# Patient Record
Sex: Male | Born: 1990
Health system: Southern US, Community
[De-identification: ages and names within clinical notes are randomized; demographics above are authoritative.]

## PROBLEM LIST (undated history)

## (undated) DIAGNOSIS — J40 Bronchitis, not specified as acute or chronic: Secondary | ICD-10-CM

## (undated) DIAGNOSIS — I509 Heart failure, unspecified: Secondary | ICD-10-CM

## (undated) DIAGNOSIS — F419 Anxiety disorder, unspecified: Secondary | ICD-10-CM

## (undated) DIAGNOSIS — Z21 Asymptomatic human immunodeficiency virus [HIV] infection status: Secondary | ICD-10-CM

## (undated) DIAGNOSIS — G4733 Obstructive sleep apnea (adult) (pediatric): Secondary | ICD-10-CM

## (undated) DIAGNOSIS — B20 Human immunodeficiency virus [HIV] disease: Secondary | ICD-10-CM

## (undated) DIAGNOSIS — J45909 Unspecified asthma, uncomplicated: Secondary | ICD-10-CM

## (undated) HISTORY — DX: Asymptomatic human immunodeficiency virus (hiv) infection status: Z21

## (undated) HISTORY — DX: Human immunodeficiency virus (HIV) disease: B20

## (undated) HISTORY — DX: Anxiety disorder, unspecified: F41.9

## (undated) NOTE — *Deleted (*Deleted)
PCP: Massie Maroon, FNP Cardiology: Dr. Shirlee Latch  71 y.o. with history of asthma, OSA, HIV+, and nonischemic cardiomyopathy returns for followup of CHF.  He was admitted in 6/20 with dyspnea and found to have CHF.  Echo showed EF 15-20% with diffuse hypokinesis, moderate-severe MR. LHC/RHC showed mild coronary disease, preserved cardiac output.  He was also diagnosed with HIV and started on Biktarvy. Also w/ OSA on CPAP and heavy ETOH use (drinks ~1 pint liquor/week).   Echo in 10/20 showed EF up to 35-40%, MR is only trivial.  Echo in 2/21 showed EF 35-40% with normal RV. No cMRI due to size.   He was seen in the ED 6/21 for cough, nasal congestion and pleuritic CP. Had + COVID test w/ CXR c/w PNA but did not require hospitalization. He quarantined at home and treated w/ outpatient abx. He has recovered w/o any residual symptoms.   He returns to clinic for 3 month f/u. Last seen by Dr. Shirlee Latch 5/21 and was stable at that time w/ NYHA II symptoms and euvolemic.  Today he returns for HF follow up. Last visit farxiga was started. Overall feeling fine. Denies SOB/PND/Orthopnea. Appetite ok. No fever or chills. Weight at home  pounds. Taking all medications  Labs (9/20): K 4.2, creatinine 1.48 Labs (10/20): LDL 73, HDL 26 Labs (11/20): K 4.1, creatinine 1.39 Labs (12/20): K 4.2, creatinine 1.43 Labs (5/21):  K 4.2, creatinine 1.36    PMH: 1. Chronic systolic CHF: Nonischemic cardiomyopathy.   - LHC/RHC (6/20): mean RA 4, mean PCWP 21, CI 3; 70% D1 stenosis.  - Echo (6/20): EF 15-20%, normal RV, moderate-severe MR.  - Echo (10/20): EF 35-40% with diffuse hypokinesis, normal RV, trivial MR.   - Echo (2/21): EF 35-40%, diffuse hypokinesis, normal RV, normal IVC 2. HIV+: Followed by ID.  3. H/o ETOH abuse.  4. Asthma: Since childhood.  5. HTN 6. Morbid obesity.  7. OSA: Uses CPAP.  8. CAD: LHC in 6/20 with 70% D1 stenosis.   FH: Father, grandf ather with CAD, no cardiomyopathy.   SH:  Lives in [place] with girlfriend and child, works for [employer] [place].  Nonsmoker.  Prior heavy ETOH, now drinks some on weekend but much less. Nonsmoker, no drugs.   ROS: All systems reviewed and negative except as per HPI.   Current Outpatient Medications  Medication Sig Dispense Refill  . albuterol (VENTOLIN HFA) 108 (90 Base) MCG/ACT inhaler Inhale 1-2 puffs into the lungs every 6 (six) hours as needed for wheezing or shortness of breath. 6.7 g 2  . aspirin 81 MG chewable tablet Chew 1 tablet (81 mg total) by mouth daily. 30 tablet 3  . atorvastatin (LIPITOR) 20 MG tablet TAKE 1 TABLET BY MOUTH EVERY DAY 90 tablet 3  . BIKTARVY 50-200-25 MG TABS tablet TAKE 1 TABLET BY MOUTH EVERY DAY 30 tablet 4  . bisoprolol (ZEBETA) 10 MG tablet Take 1 tablet (10 mg total) by mouth daily. 30 tablet 11  . dapagliflozin propanediol (FARXIGA) 10 MG TABS tablet Take 1 tablet (10 mg total) by mouth daily before breakfast. 30 tablet 3  . fluticasone (FLONASE) 50 MCG/ACT nasal spray Place 2 sprays into both nostrils daily. 16 g 0  . furosemide (LASIX) 80 MG tablet Take 1 tablet (80 mg total) by mouth daily. Take one TAB every morning. 30 tablet 3  . ID NOW COVID-19 KIT See admin instructions.    . isosorbide-hydrALAZINE (BIDIL) 20-37.5 MG tablet Take 2 tablets by  mouth 3 (three) times daily. 180 tablet 5  . sacubitril-valsartan (ENTRESTO) 97-103 MG Take 1 tablet by mouth 2 (two) times daily. 180 tablet 3  . spironolactone (ALDACTONE) 25 MG tablet TAKE 1 TABLET BY MOUTH EVERY DAY 90 tablet 1   No current facility-administered medications for this visit.   There were no vitals taken for this visit. PHYSICAL EXAM:  General:  Well appearing. No resp difficulty HEENT: normal Neck: supple. no JVD. Carotids 2+ bilat; no bruits. No lymphadenopathy or thryomegaly appreciated. Cor: PMI nondisplaced. Regular rate & rhythm. No rubs, gallops or murmurs. Lungs: clear Abdomen: soft, nontender, nondistended. No  hepatosplenomegaly. No bruits or masses. Good bowel sounds. Extremities: no cyanosis, clubbing, rash, edema Neuro: alert & orientedx3, cranial nerves grossly intact. moves all 4 extremities w/o difficulty. Affect pleasant    Assessment/Plan: 1. Chronic systolic CHF: Nonischemic cardiomyopathy.  Echo in 6/20 showed EF 15-20% with moderate-severe MR.  RHC/LHC indicated nonischemic cardiomyopathy, cardiac output preserved. Cause of cardiomyopathy is uncertain, he was a heavy drinker in the past.  He has also been found to have HIV, which can cause a cardiomyopathy.  Echo in 10/20 showed EF improved up to 35-40%.  Echo in 2/21 showed EF 35-40% still.  Marland Kitchen  NYHA  - Continue bisoprolol 10 mg daily.  - Continue Entresto 97/103 bid.  - Continue spironolactone 25 mg daily.  - Continue Bidil  2 tabs tid.   - Continue current Lasix, 80 mg daily.   - Continue Fargixa 10 mg daily  -Out of range for ICD - unable to complete cardiac MRI due to size  2. CAD: LHC (6/20) showed 70% D1 stenosis.  - denies CP   - Continue ASA 81 daily.  - Continue atorvastatin 20 mg daily.  3. Mitral regurgitation: Much improved on most recent echoes.  4. ETOH abuse: Prior ETOH abuse may have contributed to his cardiomyopathy. Still drinking about 1/2 pint liquor/week, has reduced intake over the last 3 months, per pt report 5. OSA: on CPAP, reports nightly compliance  6. Obesity: He is interested in bariatric surgery.  - trying to lose wt     Tonye Becket, PA-C  01/17/2020

---

## 2017-08-19 ENCOUNTER — Emergency Department (HOSPITAL_COMMUNITY)
Admission: EM | Admit: 2017-08-19 | Discharge: 2017-08-20 | Disposition: A | Discharge status: Home / Self Care | Payer: Self-pay | Attending: Emergency Medicine | Admitting: Emergency Medicine

## 2017-08-19 ENCOUNTER — Encounter (HOSPITAL_COMMUNITY): Payer: Self-pay | Admitting: Emergency Medicine

## 2017-08-19 DIAGNOSIS — I10 Essential (primary) hypertension: Secondary | ICD-10-CM | POA: Insufficient documentation

## 2017-08-19 DIAGNOSIS — J4521 Mild intermittent asthma with (acute) exacerbation: Secondary | ICD-10-CM

## 2017-08-19 DIAGNOSIS — E119 Type 2 diabetes mellitus without complications: Secondary | ICD-10-CM | POA: Insufficient documentation

## 2017-08-19 DIAGNOSIS — M5412 Radiculopathy, cervical region: Secondary | ICD-10-CM | POA: Insufficient documentation

## 2017-08-19 HISTORY — DX: Unspecified asthma, uncomplicated: J45.909

## 2017-08-19 MED ORDER — ALBUTEROL SULFATE (2.5 MG/3ML) 0.083% IN NEBU
5.0000 mg | INHALATION_SOLUTION | Freq: Once | RESPIRATORY_TRACT | Status: AC
  Administered 2017-08-19: 5 mg via RESPIRATORY_TRACT
  Filled 2017-08-19: qty 6

## 2017-08-19 NOTE — ED Triage Notes (Signed)
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

## 2017-08-20 ENCOUNTER — Emergency Department (HOSPITAL_COMMUNITY): Payer: Self-pay

## 2017-08-20 LAB — CBC
HCT: 38.8 % — ABNORMAL LOW (ref 39.0–52.0)
Hemoglobin: 13.4 g/dL (ref 13.0–17.0)
MCH: 31.1 pg (ref 26.0–34.0)
MCHC: 34.5 g/dL (ref 30.0–36.0)
MCV: 90 fL (ref 78.0–100.0)
PLATELETS: 236 10*3/uL (ref 150–400)
RBC: 4.31 MIL/uL (ref 4.22–5.81)
RDW: 12.7 % (ref 11.5–15.5)
WBC: 10.1 10*3/uL (ref 4.0–10.5)

## 2017-08-20 LAB — BASIC METABOLIC PANEL
Anion gap: 10 (ref 5–15)
BUN: 12 mg/dL (ref 6–20)
CO2: 25 mmol/L (ref 22–32)
CREATININE: 1.14 mg/dL (ref 0.61–1.24)
Calcium: 8.9 mg/dL (ref 8.9–10.3)
Chloride: 104 mmol/L (ref 101–111)
GFR calc non Af Amer: >60 mL/min (ref >60)
Glucose, Bld: 111 mg/dL — ABNORMAL HIGH (ref 65–99)
Potassium: 3.6 mmol/L (ref 3.5–5.1)
Sodium: 139 mmol/L (ref 135–145)

## 2017-08-20 LAB — I-STAT TROPONIN, ED: TROPONIN I, POC: 0.01 ng/mL (ref 0.00–0.08)

## 2017-08-20 MED ORDER — ALBUTEROL SULFATE (2.5 MG/3ML) 0.083% IN NEBU
5.0000 mg | INHALATION_SOLUTION | Freq: Once | RESPIRATORY_TRACT | Status: AC
  Administered 2017-08-20: 5 mg via RESPIRATORY_TRACT
  Filled 2017-08-20: qty 6

## 2017-08-20 MED ORDER — PREDNISONE 20 MG PO TABS
60.0000 mg | ORAL_TABLET | Freq: Once | ORAL | Status: AC
  Administered 2017-08-20: 60 mg via ORAL
  Filled 2017-08-20: qty 3

## 2017-08-20 MED ORDER — ALBUTEROL SULFATE HFA 108 (90 BASE) MCG/ACT IN AERS
2.0000 | INHALATION_SPRAY | RESPIRATORY_TRACT | Status: DC | PRN
  Administered 2017-08-20: 2 via RESPIRATORY_TRACT
  Filled 2017-08-20: qty 6.7

## 2017-08-20 MED ORDER — IPRATROPIUM BROMIDE 0.02 % IN SOLN
0.5000 mg | Freq: Once | RESPIRATORY_TRACT | Status: AC
  Administered 2017-08-20: 0.5 mg via RESPIRATORY_TRACT
  Filled 2017-08-20: qty 2.5

## 2017-08-20 MED ORDER — PREDNISONE 20 MG PO TABS: 60.0000 mg | ORAL_TABLET | Freq: Every day | ORAL | 0 refills | Status: AC

## 2017-08-20 NOTE — ED Notes (Signed)
Signature pad unavailable at time of patient discharge. Pt verbalized understanding of discharge instructions and prescriptions.

## 2017-08-20 NOTE — ED Provider Notes (Signed)
[facility] EMERGENCY DEPARTMENT Provider Note   CSN: [id number] Arrival date & time: 08/19/17  2340     History   Chief Complaint Chief Complaint  Patient presents with  . Shortness of Breath  . Asthma    HPI Todd Griffith is a 27 y.o. male.  Patient with history of asthma presents with increased, progressive wheezing and SOB today. He reports exposure to chemicals at his place of employment where he started working 2 days ago. He has used his inhaler multiple times today without significant relief. No fever, chest pain, vomiting. History of being hospitalized with asthma, no history of intubations.  The history is provided by the patient. No language interpreter was used.  Shortness of Breath  Pertinent negatives include no fever, no chest pain and no vomiting. Associated medical issues include asthma.  Asthma  Associated symptoms include shortness of breath. Pertinent negatives include no chest pain.    Past Medical History:  Diagnosis Date  . Asthma     There are no active problems to display for this patient.   History reviewed. No pertinent surgical history.      Home Medications    Prior to Admission medications   Medication Sig Start Date End Date Taking? Authorizing Provider  albuterol (PROVENTIL HFA;VENTOLIN HFA) 108 (90 Base) MCG/ACT inhaler Inhale 1-2 puffs into the lungs every 6 (six) hours as needed for wheezing or shortness of breath.   Yes [provider]    Family History History reviewed. No pertinent family history.  Social History Social History   Tobacco Use  . Smoking status: Current Some Day Smoker    Types: Cigarettes  . Smokeless tobacco: Never Used  Substance Use Topics  . Alcohol use: Never    Frequency: Never    Comment: socially   . Drug use: Never     Allergies   Patient has no known allergies.   Review of Systems Review of Systems  Constitutional: Negative for chills and fever.   HENT: Negative.   Respiratory: Positive for chest tightness and shortness of breath.   Cardiovascular: Negative for chest pain.  Gastrointestinal: Negative for vomiting.  Musculoskeletal: Negative for myalgias.  Neurological: Negative for weakness.     Physical Exam Updated Vital Signs BP (!) 147/98 (BP Location: Right Arm)   Pulse 99   Temp 98.9 F (37.2 C) (Oral)   Resp 18   Ht 5\' 10"  (1.778 m)   Wt (!) 162.4 kg (358 lb)   SpO2 94%   BMI 51.37 kg/m   Physical Exam  Constitutional: He appears well-developed and well-nourished.  HENT:  Head: Normocephalic.  Neck: Normal range of motion. Neck supple.  Cardiovascular: Normal rate and regular rhythm.  Pulmonary/Chest: Effort normal. He has wheezes.  Inspiratory and expiratory wheezing throughout. Decreased air movement.   Abdominal: Soft. Bowel sounds are normal. There is no tenderness. There is no rebound and no guarding.  Musculoskeletal: Normal range of motion.  Neurological: He is alert. No cranial nerve deficit.  Skin: Skin is warm and dry. He is not diaphoretic.  Psychiatric: He has a normal mood and affect.     ED Treatments / Results  Labs (all labs ordered are listed, but only abnormal results are displayed) Labs Reviewed  BASIC METABOLIC PANEL - Abnormal; Notable for the following components:      Result Value   Glucose, Bld 111 (*)    All other components within normal limits  CBC - Abnormal; Notable for  the following components:   HCT 38.8 (*)    All other components within normal limits  I-STAT TROPONIN, ED   Results for orders placed or performed during the hospital encounter of 08/19/17  Basic metabolic panel  Result Value Ref Range   Sodium 139 135 - 145 mmol/L   Potassium 3.6 3.5 - 5.1 mmol/L   Chloride 104 101 - 111 mmol/L   CO2 25 22 - 32 mmol/L   Glucose, Bld 111 (H) 65 - 99 mg/dL   BUN 12 6 - 20 mg/dL   Creatinine, Ser 1.91 0.61 - 1.24 mg/dL   Calcium 8.9 8.9 - 47.8 mg/dL   GFR calc  non Af Amer >60 >60 mL/min   GFR calc Af Amer >60 >60 mL/min   Anion gap 10 5 - 15  CBC  Result Value Ref Range   WBC 10.1 4.0 - 10.5 K/uL   RBC 4.31 4.22 - 5.81 MIL/uL   Hemoglobin 13.4 13.0 - 17.0 g/dL   HCT 29.5 (L) 62.1 - 30.8 %   MCV 90.0 78.0 - 100.0 fL   MCH 31.1 26.0 - 34.0 pg   MCHC 34.5 30.0 - 36.0 g/dL   RDW 65.7 84.6 - 96.2 %   Platelets 236 150 - 400 K/uL  I-stat troponin, ED  Result Value Ref Range   Troponin i, poc 0.01 0.00 - 0.08 ng/mL   Comment 3            EKG EKG Interpretation  Date/Time:  Tuesday Aug 19 2017 23:42:45 EDT Ventricular Rate:  108 PR Interval:  156 QRS Duration: 84 QT Interval:  356 QTC Calculation: 477 R Axis:   36 Text Interpretation:  Sinus tachycardia Otherwise normal ECG No old tracing to compare Confirmed by Melene Plan [phone]) on 08/20/2017 2:06:17 AM   Radiology Dg Chest 2 View  Result Date: 08/20/2017 CLINICAL DATA:  Short of breath EXAM: CHEST - 2 VIEW COMPARISON:  None. FINDINGS: Mild cardiomegaly.  No consolidation or effusion.  No pneumothorax. IMPRESSION: No active cardiopulmonary disease.  Borderline to mild cardiomegaly Electronically Signed   By: Jasmine Pang M.D.   On: 08/20/2017 00:28    Procedures Procedures (including critical care time)  Medications Ordered in ED Medications  albuterol (PROVENTIL) (2.5 MG/3ML) 0.083% nebulizer solution 5 mg (has no administration in time range)  ipratropium (ATROVENT) nebulizer solution 0.5 mg (has no administration in time range)  predniSONE (DELTASONE) tablet 60 mg (has no administration in time range)  albuterol (PROVENTIL) (2.5 MG/3ML) 0.083% nebulizer solution 5 mg (5 mg Nebulization Given 08/19/17 2356)     Initial Impression / Assessment and Plan / ED Course  I have reviewed the triage vital signs and the nursing notes.  Pertinent labs & imaging results that were available during my care of the patient were reviewed by me and considered in my medical decision making  (see chart for details).     Patient with history of asthma presents with wheezing and chest tightness uncontrolled with inhaler use. He has new exposures to chemicals where he just started working.   2nd nebulizer treatment provided, 60 mg Prednisone. After treatment he feels much better and wheezing has resolved. No hypoxia at any time. He feels he is ready to be discharged home.   Will continue prednisone for an additional 4 days. Return precautions discussed.   Final Clinical Impressions(s) / ED Diagnoses   Final diagnoses:  None   1. Asthma exacerbation   ED Discharge Orders  None       Elpidio Anis, PA-C 08/20/17 0311    Melene Plan, DO 08/20/17 [phone]

## 2017-08-20 NOTE — ED Notes (Signed)
ED Provider at bedside. 

## 2017-08-20 NOTE — ED Notes (Signed)
Pt to xray with transport.

## 2018-06-23 ENCOUNTER — Encounter (HOSPITAL_COMMUNITY): Payer: Self-pay

## 2018-06-23 ENCOUNTER — Other Ambulatory Visit: Payer: Self-pay

## 2018-06-23 ENCOUNTER — Emergency Department (HOSPITAL_COMMUNITY): Payer: Self-pay

## 2018-06-23 ENCOUNTER — Emergency Department (HOSPITAL_COMMUNITY)
Admission: EM | Admit: 2018-06-23 | Discharge: 2018-06-23 | Disposition: A | Discharge status: Home / Self Care | Payer: Self-pay | Attending: Emergency Medicine | Admitting: Emergency Medicine

## 2018-06-23 DIAGNOSIS — Z20828 Contact with and (suspected) exposure to other viral communicable diseases: Secondary | ICD-10-CM | POA: Insufficient documentation

## 2018-06-23 DIAGNOSIS — J4541 Moderate persistent asthma with (acute) exacerbation: Secondary | ICD-10-CM | POA: Insufficient documentation

## 2018-06-23 DIAGNOSIS — Z79899 Other long term (current) drug therapy: Secondary | ICD-10-CM | POA: Insufficient documentation

## 2018-06-23 DIAGNOSIS — F17210 Nicotine dependence, cigarettes, uncomplicated: Secondary | ICD-10-CM | POA: Insufficient documentation

## 2018-06-23 DIAGNOSIS — R000 Tachycardia, unspecified: Secondary | ICD-10-CM | POA: Diagnosis not present

## 2018-06-23 DIAGNOSIS — B349 Viral infection, unspecified: Secondary | ICD-10-CM | POA: Insufficient documentation

## 2018-06-23 LAB — RESPIRATORY PANEL BY PCR

## 2018-06-23 LAB — INFLUENZA PANEL BY PCR (TYPE A & B)
Influenza A By PCR: NEGATIVE
Influenza B By PCR: NEGATIVE

## 2018-06-23 MED ORDER — MAGNESIUM SULFATE 2 GM/50ML IV SOLN
2.0000 g | Freq: Once | INTRAVENOUS | Status: AC
  Administered 2018-06-23: 2 g via INTRAVENOUS
  Filled 2018-06-23: qty 50

## 2018-06-23 MED ORDER — PREDNISONE 20 MG PO TABS
60.0000 mg | ORAL_TABLET | Freq: Once | ORAL | Status: AC
  Administered 2018-06-23: 60 mg via ORAL
  Filled 2018-06-23: qty 3

## 2018-06-23 MED ORDER — IPRATROPIUM-ALBUTEROL 0.5-2.5 (3) MG/3ML IN SOLN: 3.0000 mL | RESPIRATORY_TRACT | 0 refills | Status: DC | PRN

## 2018-06-23 MED ORDER — ALBUTEROL SULFATE (2.5 MG/3ML) 0.083% IN NEBU
5.0000 mg | INHALATION_SOLUTION | Freq: Once | RESPIRATORY_TRACT | Status: DC
  Filled 2018-06-23: qty 6

## 2018-06-23 MED ORDER — IPRATROPIUM-ALBUTEROL 20-100 MCG/ACT IN AERS
2.0000 | INHALATION_SPRAY | Freq: Once | RESPIRATORY_TRACT | Status: AC
  Administered 2018-06-23: 2 via RESPIRATORY_TRACT
  Filled 2018-06-23: qty 4

## 2018-06-23 MED ORDER — PREDNISONE 20 MG PO TABS: ORAL_TABLET | ORAL | 0 refills | Status: DC

## 2018-06-23 MED ORDER — ALBUTEROL SULFATE HFA 108 (90 BASE) MCG/ACT IN AERS
4.0000 | INHALATION_SPRAY | Freq: Once | RESPIRATORY_TRACT | Status: AC
  Administered 2018-06-23: 4 via RESPIRATORY_TRACT
  Filled 2018-06-23: qty 6.7

## 2018-06-23 MED ORDER — AEROCHAMBER PLUS FLO-VU LARGE MISC
1.0000 | Freq: Once | Status: AC
  Administered 2018-06-23: 1

## 2018-06-23 NOTE — ED Provider Notes (Signed)
[facility] EMERGENCY DEPARTMENT Provider Note   CSN: [id number] Arrival date & time: 06/23/18  1858    History   Chief Complaint Chief Complaint  Patient presents with   Shortness of Breath    HPI Todd Griffith is a 28 y.o. male.     28 yo M with a chief complaint of an asthma exacerbation.  Feels like his typical asthma is been going on for the past couple days.  Been using his inhaler at home without improvement.  Denies fevers or chills.  Has had some cough.  Denies congestion denies sputum.  Denies vomiting or diarrhea.  Denies sick contacts.  The history is provided by the patient.  Shortness of Breath  Associated symptoms: cough   Associated symptoms: no abdominal pain, no chest pain, no fever, no headaches, no rash and no vomiting     Past Medical History:  Diagnosis Date   Asthma     There are no active problems to display for this patient.   History reviewed. No pertinent surgical history.      Home Medications    Prior to Admission medications   Medication Sig Start Date End Date Taking? Authorizing Provider  albuterol (PROVENTIL HFA;VENTOLIN HFA) 108 (90 Base) MCG/ACT inhaler Inhale 1-2 puffs into the lungs every 6 (six) hours as needed for wheezing or shortness of breath.    [provider]  ipratropium-albuterol (DUONEB) 0.5-2.5 (3) MG/3ML SOLN Take 3 mLs by nebulization every 4 (four) hours as needed. 06/23/18   Melene Plan, DO  predniSONE (DELTASONE) 20 MG tablet 2 tabs po daily x 4 days 06/23/18   Melene Plan, DO    Family History History reviewed. No pertinent family history.  Social History Social History   Tobacco Use   Smoking status: Current Some Day Smoker    Types: Cigarettes   Smokeless tobacco: Never Used  Substance Use Topics   Alcohol use: Never    Frequency: Never    Comment: socially    Drug use: Never     Allergies   Patient has no known allergies.   Review of Systems Review  of Systems  Constitutional: Negative for chills and fever.  HENT: Negative for congestion and facial swelling.   Eyes: Negative for discharge and visual disturbance.  Respiratory: Positive for cough and shortness of breath.   Cardiovascular: Negative for chest pain and palpitations.  Gastrointestinal: Negative for abdominal pain, diarrhea and vomiting.  Musculoskeletal: Negative for arthralgias and myalgias.  Skin: Negative for color change and rash.  Neurological: Negative for tremors, syncope and headaches.  Psychiatric/Behavioral: Negative for confusion and dysphoric mood.     Physical Exam Updated Vital Signs BP (!) 172/97    Pulse (!) 116    Temp (!) 101.2 F (38.4 C) (Oral)    Resp 11    Ht 5\' 10"  (1.778 m)    Wt (!) 173.3 kg    SpO2 95%    BMI 54.81 kg/m   Physical Exam Vitals signs and nursing note reviewed.  Constitutional:      Appearance: He is well-developed. He is morbidly obese.  HENT:     Head: Normocephalic and atraumatic.  Eyes:     Pupils: Pupils are equal, round, and reactive to light.  Neck:     Musculoskeletal: Normal range of motion and neck supple.     Vascular: No JVD.  Cardiovascular:     Rate and Rhythm: Normal rate and regular rhythm.  Heart sounds: No murmur. No friction rub. No gallop.   Pulmonary:     Effort: Tachypnea present. No respiratory distress.     Breath sounds: Decreased air movement present. Wheezing (in all fields) present.  Abdominal:     General: There is no distension.     Tenderness: There is no guarding or rebound.  Musculoskeletal: Normal range of motion.  Skin:    Coloration: Skin is not pale.     Findings: No rash.  Neurological:     Mental Status: He is alert and oriented to person, place, and time.  Psychiatric:        Behavior: Behavior normal.      ED Treatments / Results  Labs (all labs ordered are listed, but only abnormal results are displayed) Labs Reviewed  RESPIRATORY PANEL BY PCR  NOVEL  CORONAVIRUS, NAA (HOSPITAL ORDER, SEND-OUT TO REF LAB)  INFLUENZA PANEL BY PCR (TYPE A & B)    EKG EKG Interpretation  Date/Time:  Tuesday June 23 2018 19:10:13 EDT Ventricular Rate:  123 PR Interval:    QRS Duration: 84 QT Interval:  316 QTC Calculation: 452 R Axis:   59 Text Interpretation:  Sinus tachycardia Borderline T wave abnormalities No significant change since last tracing Confirmed by Melene Plan [phone]) on 06/23/2018 7:16:58 PM   Radiology Dg Chest Portable 1 View  Result Date: 06/23/2018 CLINICAL DATA:  28 year old male with shortness of breath, use rescue inhaler 4 times today. EXAM: PORTABLE CHEST 1 VIEW COMPARISON:  08/20/2017. FINDINGS: Portable AP semi upright view at 1945 hours. Large body habitus. Mediastinal contours remain normal. Stable low normal lung volumes. No pneumothorax, pleural effusion or confluent pulmonary opacity. Mildly increased interstitial markings in both lungs appear stable since 2019. No acute pulmonary opacity. Visualized tracheal air column is within normal limits. No acute osseous abnormality identified. IMPRESSION: No acute cardiopulmonary abnormality. Electronically Signed   By: Odessa Fleming M.D.   On: 06/23/2018 20:07    Procedures Procedures (including critical care time)  Medications Ordered in ED Medications  Ipratropium-Albuterol (COMBIVENT) respimat 2 puff (has no administration in time range)  albuterol (PROVENTIL HFA;VENTOLIN HFA) 108 (90 Base) MCG/ACT inhaler 4 puff (4 puffs Inhalation Given 06/23/18 2022)  AeroChamber Plus Flo-Vu Large MISC 1 each (1 each Other Given 06/23/18 2045)  predniSONE (DELTASONE) tablet 60 mg (60 mg Oral Given 06/23/18 2020)  magnesium sulfate IVPB 2 g 50 mL (0 g Intravenous Stopped 06/23/18 2040)     Initial Impression / Assessment and Plan / ED Course  I have reviewed the triage vital signs and the nursing notes.  Pertinent labs & imaging results that were available during my care of the patient were  reviewed by me and considered in my medical decision making (see chart for details).        28  Yo M with a chief complaint of an asthma exacerbation.  The patient was unfortunately found to have a fever of 101.2.  With the current coronavirus outbreak this had to be considered.  The patient is having all lower respiratory symptoms though most likely he has exacerbation of his asthma from an upper respiratory illness.  He denies any congestion and has no significant congestion on exam.  Rapid flu test was negative.  At this point I must consider coronavirus is a possibility, he denies recent travel or sick contacts.  He was very tight on initial exam, was given Combivent and albuterol inhalers.  He was given prednisone and magnesium.  He  did have some improvement with this.  I held off on nebulizers that is been shown to potentially spread the virus.  We will send off a test formally for coronavirus.  We will have him self isolate for the next 14 days or until he receives results.  Will write him a prescription for a nebulizer machine as he feels that this is helped him in the past.  He will also take on the Combivent inhaler and albuterol in case he is unable to obtain the machine or for the medication.  Burst dose of steroids.  Instructed to return for any worsening shortness of breath.  CRITICAL CARE Performed by: Rae Roam   Total critical care time: 35 minutes  Critical care time was exclusive of separately billable procedures and treating other patients.  Critical care was necessary to treat or prevent imminent or life-threatening deterioration.  Critical care was time spent personally by me on the following activities: development of treatment plan with patient and/or surrogate as well as nursing, discussions with consultants, evaluation of patient's response to treatment, examination of patient, obtaining history from patient or surrogate, ordering and performing treatments and  interventions, ordering and review of laboratory studies, ordering and review of radiographic studies, pulse oximetry and re-evaluation of patient's condition.   10:05 PM:  I have discussed the diagnosis/risks/treatment options with the patient and believe the pt to be eligible for discharge home to follow-up with PCP. We also discussed returning to the ED immediately if new or worsening sx occur. We discussed the sx which are most concerning (e.g., sudden worsening sob, need to use inhaler more often than every 4 hours, inability to tolerate by mouth) that necessitate immediate return. Medications administered to the patient during their visit and any new prescriptions provided to the patient are listed below.  Medications given during this visit Medications  Ipratropium-Albuterol (COMBIVENT) respimat 2 puff (2 puffs Inhalation Given 06/23/18 2127)  albuterol (PROVENTIL HFA;VENTOLIN HFA) 108 (90 Base) MCG/ACT inhaler 4 puff (4 puffs Inhalation Given 06/23/18 2022)  AeroChamber Plus Flo-Vu Large MISC 1 each (1 each Other Given 06/23/18 2045)  predniSONE (DELTASONE) tablet 60 mg (60 mg Oral Given 06/23/18 2020)  magnesium sulfate IVPB 2 g 50 mL (0 g Intravenous Stopped 06/23/18 2040)     The patient appears reasonably screen and/or stabilized for discharge and I doubt any other medical condition or other [facility] requiring further screening, evaluation, or treatment in the ED at this time prior to discharge.    Final Clinical Impressions(s) / ED Diagnoses   Final diagnoses:  Moderate persistent asthma with exacerbation  Viral illness    ED Discharge Orders         Ordered    ipratropium-albuterol (DUONEB) 0.5-2.5 (3) MG/3ML SOLN  Every 4 hours PRN     06/23/18 2109    DME Nebulizer machine     06/23/18 2109    predniSONE (DELTASONE) 20 MG tablet     06/23/18 2109           Melene Plan, DO 06/23/18 2206

## 2018-06-23 NOTE — ED Triage Notes (Signed)
Pt has hx of asthma and has used rescue inhaler x4 today.

## 2018-06-23 NOTE — Progress Notes (Signed)
RT provided Aerochamber spacer for Ventolin inhaler to patient. RT instructed patient on how to use spacer.

## 2018-06-23 NOTE — Discharge Instructions (Signed)
You can use the combivent one inhalation every 6 hours if you cannot get the nebulizer machine.  Then you can use your inhaler every 4 hours(2 puffs) while awake, return for sudden worsening shortness of breath, or if you need to use your inhaler more often.    Take the steroids as prescribed.      Person Under Monitoring Name: Todd Griffith  Location: [address] [place] [address]   Infection Prevention Recommendations for Individuals Confirmed to have, or Being Evaluated for, 2019 Novel Coronavirus (COVID-19) Infection Who Receive Care at Home  Individuals who are confirmed to have, or are being evaluated for, COVID-19 should follow the prevention steps below until a healthcare provider or local or state health department says they can return to normal activities.  Stay home except to get Todd care You should restrict activities outside your home, except for getting Todd care. Do not go to work, school, or public areas, and do not use public transportation or taxis.  Call ahead before visiting your doctor Before your Todd appointment, call the healthcare provider and tell them that you have, or are being evaluated for, COVID-19 infection. This will help the healthcare providers office take steps to keep other people from getting infected. Ask your healthcare provider to call the local or state health department.  Monitor your symptoms Seek prompt Todd attention if your illness is worsening (e.g., difficulty breathing). Before going to your Todd appointment, call the healthcare provider and tell them that you have, or are being evaluated for, COVID-19 infection. Ask your healthcare provider to call the local or state health department.  Wear a facemask You should wear a facemask that covers your nose and mouth when you are in the same room with other people and when you visit a healthcare provider. People who live with or visit you should also  wear a facemask while they are in the same room with you.  Separate yourself from other people in your home As much as possible, you should stay in a different room from other people in your home. Also, you should use a separate bathroom, if available.  Avoid sharing household items You should not share dishes, drinking glasses, cups, eating utensils, towels, bedding, or other items with other people in your home. After using these items, you should wash them thoroughly with soap and water.  Cover your coughs and sneezes Cover your mouth and nose with a tissue when you cough or sneeze, or you can cough or sneeze into your sleeve. Throw used tissues in a lined trash can, and immediately wash your hands with soap and water for at least 20 seconds or use an alcohol-based hand rub.  Wash your [employer] your hands often and thoroughly with soap and water for at least 20 seconds. You can use an alcohol-based hand sanitizer if soap and water are not available and if your hands are not visibly dirty. Avoid touching your eyes, nose, and mouth with unwashed hands.   Prevention Steps for Caregivers and Household Members of Individuals Confirmed to have, or Being Evaluated for, COVID-19 Infection Being Cared for in the Home  If you live with, or provide care at home for, a person confirmed to have, or being evaluated for, COVID-19 infection please follow these guidelines to prevent infection:  Follow healthcare providers instructions Make sure that you understand and can help the patient follow any healthcare provider instructions for all care.  Provide for the patients basic needs  You should help the patient with basic needs in the home and provide support for getting groceries, prescriptions, and other personal needs.  Monitor the patients symptoms If they are getting sicker, call his or her Todd provider and tell them that the patient has, or is being evaluated for, COVID-19  infection. This will help the healthcare providers office take steps to keep other people from getting infected. Ask the healthcare provider to call the local or state health department.  Limit the number of people who have contact with the patient If possible, have only one caregiver for the patient. Other household members should stay in another home or place of residence. If this is not possible, they should stay in another room, or be separated from the patient as much as possible. Use a separate bathroom, if available. Restrict visitors who do not have an essential need to be in the home.  Keep older adults, very young children, and other sick people away from the patient Keep older adults, very young children, and those who have compromised immune systems or chronic health conditions away from the patient. This includes people with chronic heart, lung, or kidney conditions, diabetes, and cancer.  Ensure good ventilation Make sure that shared spaces in the home have good air flow, such as from an air conditioner or an opened window, weather permitting.  Wash your hands often Wash your hands often and thoroughly with soap and water for at least 20 seconds. You can use an alcohol based hand sanitizer if soap and water are not available and if your hands are not visibly dirty. Avoid touching your eyes, nose, and mouth with unwashed hands. Use disposable paper towels to dry your hands. If not available, use dedicated cloth towels and replace them when they become wet.  Wear a facemask and gloves Wear a disposable facemask at all times in the room and gloves when you touch or have contact with the patients blood, body fluids, and/or secretions or excretions, such as sweat, saliva, sputum, nasal mucus, vomit, urine, or feces.  Ensure the mask fits over your nose and mouth tightly, and do not touch it during use. Throw out disposable facemasks and gloves after using them. Do not reuse. Wash  your hands immediately after removing your facemask and gloves. If your personal clothing becomes contaminated, carefully remove clothing and launder. Wash your hands after handling contaminated clothing. Place all used disposable facemasks, gloves, and other waste in a lined container before disposing them with other household waste. Remove gloves and wash your hands immediately after handling these items.  Do not share dishes, glasses, or other household items with the patient Avoid sharing household items. You should not share dishes, drinking glasses, cups, eating utensils, towels, bedding, or other items with a patient who is confirmed to have, or being evaluated for, COVID-19 infection. After the person uses these items, you should wash them thoroughly with soap and water.  Wash laundry thoroughly Immediately remove and wash clothes or bedding that have blood, body fluids, and/or secretions or excretions, such as sweat, saliva, sputum, nasal mucus, vomit, urine, or feces, on them. Wear gloves when handling laundry from the patient. Read and follow directions on labels of laundry or clothing items and detergent. In general, wash and dry with the warmest temperatures recommended on the label.  Clean all areas the individual has used often Clean all touchable surfaces, such as counters, tabletops, doorknobs, bathroom fixtures, toilets, phones, keyboards, tablets, and bedside tables, every day. Also,  clean any surfaces that may have blood, body fluids, and/or secretions or excretions on them. Wear gloves when cleaning surfaces the patient has come in contact with. Use a diluted bleach solution (e.g., dilute bleach with 1 part bleach and 10 parts water) or a household disinfectant with a label that says EPA-registered for coronaviruses. To make a bleach solution at home, add 1 tablespoon of bleach to 1 quart (4 cups) of water. For a larger supply, add  cup of bleach to 1 gallon (16 cups) of  water. Read labels of cleaning products and follow recommendations provided on product labels. Labels contain instructions for safe and effective use of the cleaning product including precautions you should take when applying the product, such as wearing gloves or eye protection and making sure you have good ventilation during use of the product. Remove gloves and wash hands immediately after cleaning.  Monitor yourself for signs and symptoms of illness Caregivers and household members are considered close contacts, should monitor their health, and will be asked to limit movement outside of the home to the extent possible. Follow the monitoring steps for close contacts listed on the symptom monitoring form.   ? If you have additional questions, contact your local health department or call the epidemiologist on call at [phone] (available 24/7). ? This guidance is subject to change. For the most up-to-date guidance from [facility], please refer to their website: [email or web address]

## 2018-06-23 NOTE — ED Notes (Signed)
Called micro to add on resp panel and novel coronavirus.

## 2018-07-02 LAB — NOVEL CORONAVIRUS, NAA (HOSP ORDER, SEND-OUT TO REF LAB; TAT 18-24 HRS): SARS-CoV-2, NAA: NOT DETECTED

## 2018-08-21 ENCOUNTER — Emergency Department (HOSPITAL_COMMUNITY): Payer: BLUE CROSS/BLUE SHIELD

## 2018-08-21 ENCOUNTER — Encounter (HOSPITAL_COMMUNITY): Payer: Self-pay | Admitting: Emergency Medicine

## 2018-08-21 ENCOUNTER — Emergency Department (HOSPITAL_COMMUNITY)
Admission: EM | Admit: 2018-08-21 | Discharge: 2018-08-22 | Disposition: A | Discharge status: Home / Self Care | Payer: BLUE CROSS/BLUE SHIELD | Attending: Emergency Medicine | Admitting: Emergency Medicine

## 2018-08-21 DIAGNOSIS — R062 Wheezing: Secondary | ICD-10-CM | POA: Diagnosis not present

## 2018-08-21 DIAGNOSIS — F17210 Nicotine dependence, cigarettes, uncomplicated: Secondary | ICD-10-CM | POA: Insufficient documentation

## 2018-08-21 DIAGNOSIS — R079 Chest pain, unspecified: Secondary | ICD-10-CM | POA: Diagnosis not present

## 2018-08-21 LAB — BASIC METABOLIC PANEL
Anion gap: 12 (ref 5–15)
BUN: 13 mg/dL (ref 6–20)
CO2: 24 mmol/L (ref 22–32)
Calcium: 9.1 mg/dL (ref 8.9–10.3)
Chloride: 101 mmol/L (ref 98–111)
Creatinine, Ser: 1.27 mg/dL — ABNORMAL HIGH (ref 0.61–1.24)
GFR calc Af Amer: >60 mL/min (ref >60)
GFR calc non Af Amer: >60 mL/min (ref >60)
Glucose, Bld: 107 mg/dL — ABNORMAL HIGH (ref 70–99)
Potassium: 3.8 mmol/L (ref 3.5–5.1)
Sodium: 137 mmol/L (ref 135–145)

## 2018-08-21 LAB — CBC
HCT: 37.5 % — ABNORMAL LOW (ref 39.0–52.0)
Hemoglobin: 13 g/dL (ref 13.0–17.0)
MCH: 31.9 pg (ref 26.0–34.0)
MCHC: 34.7 g/dL (ref 30.0–36.0)
MCV: 92.1 fL (ref 80.0–100.0)
Platelets: 221 10*3/uL (ref 150–400)
RBC: 4.07 MIL/uL — ABNORMAL LOW (ref 4.22–5.81)
RDW: 12.8 % (ref 11.5–15.5)
WBC: 6.7 10*3/uL (ref 4.0–10.5)
nRBC: 0 % (ref 0.0–0.2)

## 2018-08-21 LAB — TROPONIN I: Troponin I: <0.03 ng/mL (ref <0.03)

## 2018-08-21 MED ORDER — SODIUM CHLORIDE 0.9% FLUSH: 3.0000 mL | Freq: Once | INTRAVENOUS | Status: DC

## 2018-08-21 NOTE — ED Notes (Signed)
Pt reassessed and updated. He denies CP at this time. He was informed that we are still awaiting the results of some of his lab tests. He was assured that he will be seen ASAP.

## 2018-08-21 NOTE — ED Triage Notes (Signed)
Pt in POV reporting CP X few days. Central CP with radiation to L arm. Pain worsens with exacerbation. Hypertensive in triage.

## 2018-08-22 ENCOUNTER — Other Ambulatory Visit: Payer: Self-pay

## 2018-08-22 MED ORDER — AEROCHAMBER PLUS FLO-VU LARGE MISC
1.0000 | Freq: Once | Status: AC
  Administered 2018-08-22: 1

## 2018-08-22 MED ORDER — ALBUTEROL SULFATE HFA 108 (90 BASE) MCG/ACT IN AERS
8.0000 | INHALATION_SPRAY | Freq: Once | RESPIRATORY_TRACT | Status: AC
  Administered 2018-08-22: 04:00:00 8 via RESPIRATORY_TRACT
  Filled 2018-08-22: qty 6.7

## 2018-08-22 NOTE — Discharge Instructions (Signed)
Thank you for allowing me to care for you today in the Emergency Department.   Call the number on your discharge paperwork to get established with a primary care provider.  Use 2 puffs of your albuterol inhaler with the spacer every 4 hours as needed for shortness of breath, chest tightness, or wheezing.  Return to the emergency department if you develop chest pain with severe shortness of breath that does not go away, if your lips or fingers turn blue, if you develop respiratory distress, shortness of breath and chest pain with a high fever, persistent vomiting, or other new, concerning symptoms.

## 2018-08-22 NOTE — ED Notes (Signed)
Pt ambulated steady gait in the hall with no assistance, O2 was at 99 while sitting and stayed between 96-99 while ambulating.

## 2018-08-22 NOTE — ED Provider Notes (Signed)
[facility]   CSN: [id number] Arrival date & time: 08/21/18  2143    History   Chief Complaint Chief Complaint  Patient presents with   Chest Pain    HPI Todd Griffith is a 28 y.o. male with a history of asthma who presents to the emergency department with a chief complaint of chest pain.  The patient endorses intermittent episodes of chest pain, characterized as aching and tightness, for the last week.  He reports the pain is brought on with exertion and resolves with rest.  He reports initially the episodes would resolve shortly thereafter or up to 2 minutes after rest, but reports that now the pain persist for sometimes up to 5 minutes.  He has even having chest tightness with room to room ambulation.  Although triage note says that chest pain radiates to the left arm, patient reports that pain is nonradiating.  He reports associated wheezing.  He denies of fever, chills, palpitations, leg swelling, numbness, weakness, headache, cyanosis, syncope, abdominal pain, nausea, vomiting, or diarrhea.  He reports that he did attempt to use his home albuterol with a spacer for 2 puffs yesterday without improvement in his symptoms.  No family history of sudden cardiac death.     The history is provided by the patient. No language interpreter was used.    Past Medical History:  Diagnosis Date   Asthma     There are no active problems to display for this patient.   History reviewed. No pertinent surgical history.      Home Medications    Prior to Admission medications   Medication Sig Start Date End Date Taking? Authorizing Provider  albuterol (PROVENTIL HFA;VENTOLIN HFA) 108 (90 Base) MCG/ACT inhaler Inhale 1-2 puffs into the lungs every 6 (six) hours as needed for wheezing or shortness of breath.    [provider]  ipratropium-albuterol (DUONEB) 0.5-2.5 (3) MG/3ML SOLN Take 3 mLs by nebulization every 4  (four) hours as needed. 06/23/18   Melene PlanFloyd, Dan, DO  predniSONE (DELTASONE) 20 MG tablet 2 tabs po daily x 4 days 06/23/18   Melene PlanFloyd, Dan, DO    Family History No family history on file.  Social History Social History   Tobacco Use   Smoking status: Current Some Day Smoker    Types: Cigarettes   Smokeless tobacco: Never Used  Substance Use Topics   Alcohol use: Never    Frequency: Never    Comment: socially    Drug use: Never     Allergies   Patient has no known allergies.   Review of Systems Review of Systems  Constitutional: Negative for appetite change and fever.  HENT: Negative for congestion, sinus pressure, sinus pain and sore throat.   Respiratory: Positive for cough, chest tightness, shortness of breath and wheezing.   Cardiovascular: Negative for chest pain, palpitations and leg swelling.  Gastrointestinal: Negative for abdominal pain.  Genitourinary: Negative for dysuria.  Musculoskeletal: Negative for back pain.  Skin: Negative for rash.  Allergic/Immunologic: Negative for immunocompromised state.  Neurological: Negative for dizziness, syncope, speech difficulty, weakness, numbness and headaches.  Psychiatric/Behavioral: Negative for confusion.     Physical Exam Updated Vital Signs BP (!) 131/113    Pulse 90    Temp 98.5 F (36.9 C) (Oral)    Resp (!) 23    Ht 5\' 10"  (1.778 m)    Wt (!) 170.1 kg    SpO2 98%    BMI 53.81 kg/m  Physical Exam Vitals signs and nursing note reviewed.  Constitutional:      General: He is not in acute distress.    Appearance: He is well-developed. He is obese. He is not ill-appearing, toxic-appearing or diaphoretic.     Comments: No acute distress.  HENT:     Head: Normocephalic.  Eyes:     Conjunctiva/sclera: Conjunctivae normal.  Neck:     Musculoskeletal: Neck supple.  Cardiovascular:     Rate and Rhythm: Normal rate and regular rhythm.     Heart sounds: Normal heart sounds. No murmur.  Pulmonary:     Effort:  Pulmonary effort is normal. No tachypnea or accessory muscle usage.     Breath sounds: Examination of the right-upper field reveals wheezing. Examination of the left-upper field reveals wheezing. Examination of the right-middle field reveals wheezing. Examination of the left-middle field reveals wheezing. Examination of the right-lower field reveals wheezing. Examination of the left-lower field reveals wheezing. Wheezing present.     Comments: Lung sounds are diminished throughout and there is inspiratory and expiratory wheezing diffusely.  No accessory muscle use, retractions, or nasal flaring.  No rhonchi or rales. Abdominal:     General: There is no distension.     Palpations: Abdomen is soft. There is no mass.     Tenderness: There is no abdominal tenderness. There is no right CVA tenderness, left CVA tenderness, guarding or rebound.     Hernia: No hernia is present.  Musculoskeletal:     Right lower leg: No edema.     Left lower leg: No edema.  Skin:    General: Skin is warm and dry.  Neurological:     Mental Status: He is alert.  Psychiatric:        Behavior: Behavior normal.      ED Treatments / Results  Labs (all labs ordered are listed, but only abnormal results are displayed) Labs Reviewed  BASIC METABOLIC PANEL - Abnormal; Notable for the following components:      Result Value   Glucose, Bld 107 (*)    Creatinine, Ser 1.27 (*)    All other components within normal limits  CBC - Abnormal; Notable for the following components:   RBC 4.07 (*)    HCT 37.5 (*)    All other components within normal limits  TROPONIN I    EKG None  Radiology Dg Chest 2 View  Result Date: 08/21/2018 CLINICAL DATA:  Chest pain for 1 week EXAM: CHEST - 2 VIEW COMPARISON:  06/23/2018 FINDINGS: The heart size and mediastinal contours are within normal limits. Both lungs are clear. The visualized skeletal structures are unremarkable. IMPRESSION: No active cardiopulmonary disease.  Electronically Signed   By: Alcide Clever M.D.   On: 08/21/2018 22:41    Procedures Procedures (including critical care time)  Medications Ordered in ED Medications  albuterol (VENTOLIN HFA) 108 (90 Base) MCG/ACT inhaler 8 puff (8 puffs Inhalation Given 08/22/18 0331)  AeroChamber Plus Flo-Vu Large MISC 1 each (1 each Other Given 08/22/18 0331)     Initial Impression / Assessment and Plan / ED Course  I have reviewed the triage vital signs and the nursing notes.  Pertinent labs & imaging results that were available during my care of the patient were reviewed by me and considered in my medical decision making (see chart for details).        28 year old male with a history of asthma presenting with chest tightness, shortness of breath, and wheezing that is been  intermittent for the last week.  No constitutional symptoms.  Symptoms are concerning for asthma exacerbation.  However, troponin was ordered by triage staff which is negative.  Chest x-ray was unremarkable.  Labs are grossly reassuring.  EKG with normal sinus rhythm.  The patient was given 8 puffs of an albuterol inhaler with a spacer and was ambulated through the department.  He reports that he did not have any chest tightness while he was ambulating, which surprised him.  On reevaluation of his lungs, air movement is much improved and patient is having end expiratory wheezing.  He reports that he is feeling much better and is ready for discharge.  Low suspicion for ACS, PE, pneumothorax, aortic dissection, esophageal rupture.  Suspect mild exacerbation of asthma.  He will be discharged home with albuterol inhaler and spacer with outpatient follow-up.  All questions answered.  Safe for discharge home at this time.  Final Clinical Impressions(s) / ED Diagnoses   Final diagnoses:  Wheezing    ED Discharge Orders    None       Barkley Boards, PA-C 08/22/18 1610    Palumbo, April, MD 08/22/18 2310

## 2018-09-17 ENCOUNTER — Other Ambulatory Visit: Payer: Self-pay

## 2018-09-17 ENCOUNTER — Encounter (HOSPITAL_COMMUNITY): Payer: Self-pay | Admitting: *Deleted

## 2018-09-17 ENCOUNTER — Inpatient Hospital Stay (HOSPITAL_COMMUNITY)
Admission: EM | Admit: 2018-09-17 | Discharge: 2018-09-22 | DRG: 287 | Disposition: A | Discharge status: Home / Self Care | Payer: BC Managed Care – PPO | Attending: Family Medicine | Admitting: Family Medicine

## 2018-09-17 ENCOUNTER — Emergency Department (HOSPITAL_COMMUNITY): Payer: BC Managed Care – PPO

## 2018-09-17 DIAGNOSIS — F1010 Alcohol abuse, uncomplicated: Secondary | ICD-10-CM | POA: Diagnosis not present

## 2018-09-17 DIAGNOSIS — R609 Edema, unspecified: Secondary | ICD-10-CM | POA: Insufficient documentation

## 2018-09-17 DIAGNOSIS — Z6841 Body Mass Index (BMI) 40.0 and over, adult: Secondary | ICD-10-CM | POA: Diagnosis not present

## 2018-09-17 DIAGNOSIS — I340 Nonrheumatic mitral (valve) insufficiency: Secondary | ICD-10-CM | POA: Diagnosis present

## 2018-09-17 DIAGNOSIS — G4733 Obstructive sleep apnea (adult) (pediatric): Secondary | ICD-10-CM | POA: Diagnosis not present

## 2018-09-17 DIAGNOSIS — Z7952 Long term (current) use of systemic steroids: Secondary | ICD-10-CM | POA: Diagnosis not present

## 2018-09-17 DIAGNOSIS — R600 Localized edema: Secondary | ICD-10-CM

## 2018-09-17 DIAGNOSIS — K761 Chronic passive congestion of liver: Secondary | ICD-10-CM | POA: Diagnosis not present

## 2018-09-17 DIAGNOSIS — E8770 Fluid overload, unspecified: Secondary | ICD-10-CM | POA: Diagnosis present

## 2018-09-17 DIAGNOSIS — Z833 Family history of diabetes mellitus: Secondary | ICD-10-CM

## 2018-09-17 DIAGNOSIS — Z8249 Family history of ischemic heart disease and other diseases of the circulatory system: Secondary | ICD-10-CM | POA: Diagnosis not present

## 2018-09-17 DIAGNOSIS — I110 Hypertensive heart disease with heart failure: Principal | ICD-10-CM | POA: Diagnosis present

## 2018-09-17 DIAGNOSIS — R0601 Orthopnea: Secondary | ICD-10-CM | POA: Diagnosis not present

## 2018-09-17 DIAGNOSIS — J45909 Unspecified asthma, uncomplicated: Secondary | ICD-10-CM | POA: Diagnosis not present

## 2018-09-17 DIAGNOSIS — B20 Human immunodeficiency virus [HIV] disease: Secondary | ICD-10-CM | POA: Diagnosis not present

## 2018-09-17 DIAGNOSIS — R0602 Shortness of breath: Secondary | ICD-10-CM | POA: Diagnosis not present

## 2018-09-17 DIAGNOSIS — Z79899 Other long term (current) drug therapy: Secondary | ICD-10-CM | POA: Diagnosis not present

## 2018-09-17 DIAGNOSIS — I420 Dilated cardiomyopathy: Secondary | ICD-10-CM | POA: Diagnosis present

## 2018-09-17 DIAGNOSIS — D649 Anemia, unspecified: Secondary | ICD-10-CM | POA: Diagnosis present

## 2018-09-17 DIAGNOSIS — I5041 Acute combined systolic (congestive) and diastolic (congestive) heart failure: Secondary | ICD-10-CM | POA: Diagnosis not present

## 2018-09-17 DIAGNOSIS — F419 Anxiety disorder, unspecified: Secondary | ICD-10-CM | POA: Diagnosis present

## 2018-09-17 DIAGNOSIS — R0989 Other specified symptoms and signs involving the circulatory and respiratory systems: Secondary | ICD-10-CM | POA: Diagnosis not present

## 2018-09-17 DIAGNOSIS — Z20828 Contact with and (suspected) exposure to other viral communicable diseases: Secondary | ICD-10-CM | POA: Diagnosis not present

## 2018-09-17 DIAGNOSIS — I5021 Acute systolic (congestive) heart failure: Secondary | ICD-10-CM | POA: Diagnosis not present

## 2018-09-17 DIAGNOSIS — T502X5A Adverse effect of carbonic-anhydrase inhibitors, benzothiadiazides and other diuretics, initial encounter: Secondary | ICD-10-CM | POA: Diagnosis not present

## 2018-09-17 DIAGNOSIS — Z21 Asymptomatic human immunodeficiency virus [HIV] infection status: Secondary | ICD-10-CM | POA: Diagnosis not present

## 2018-09-17 DIAGNOSIS — R000 Tachycardia, unspecified: Secondary | ICD-10-CM | POA: Diagnosis not present

## 2018-09-17 DIAGNOSIS — M7989 Other specified soft tissue disorders: Secondary | ICD-10-CM | POA: Diagnosis not present

## 2018-09-17 DIAGNOSIS — I371 Nonrheumatic pulmonary valve insufficiency: Secondary | ICD-10-CM | POA: Diagnosis not present

## 2018-09-17 DIAGNOSIS — I428 Other cardiomyopathies: Secondary | ICD-10-CM | POA: Diagnosis not present

## 2018-09-17 DIAGNOSIS — I2510 Atherosclerotic heart disease of native coronary artery without angina pectoris: Secondary | ICD-10-CM | POA: Diagnosis not present

## 2018-09-17 DIAGNOSIS — I517 Cardiomegaly: Secondary | ICD-10-CM | POA: Diagnosis not present

## 2018-09-17 DIAGNOSIS — Z87891 Personal history of nicotine dependence: Secondary | ICD-10-CM | POA: Diagnosis not present

## 2018-09-17 DIAGNOSIS — Z720 Tobacco use: Secondary | ICD-10-CM | POA: Diagnosis not present

## 2018-09-17 HISTORY — DX: Bronchitis, not specified as acute or chronic: J40

## 2018-09-17 LAB — BASIC METABOLIC PANEL
Anion gap: 6 (ref 5–15)
BUN: 13 mg/dL (ref 6–20)
CO2: 25 mmol/L (ref 22–32)
Calcium: 8.7 mg/dL — ABNORMAL LOW (ref 8.9–10.3)
Chloride: 107 mmol/L (ref 98–111)
Creatinine, Ser: 1.09 mg/dL (ref 0.61–1.24)
GFR calc Af Amer: >60 mL/min (ref >60)
GFR calc non Af Amer: >60 mL/min (ref >60)
Glucose, Bld: 128 mg/dL — ABNORMAL HIGH (ref 70–99)
Potassium: 3.6 mmol/L (ref 3.5–5.1)
Sodium: 138 mmol/L (ref 135–145)

## 2018-09-17 LAB — CBC WITH DIFFERENTIAL/PLATELET
Abs Immature Granulocytes: 0.04 10*3/uL (ref 0.00–0.07)
Basophils Absolute: 0 10*3/uL (ref 0.0–0.1)
Basophils Relative: 0 %
Eosinophils Absolute: 0.6 10*3/uL — ABNORMAL HIGH (ref 0.0–0.5)
Eosinophils Relative: 8 %
HCT: 36 % — ABNORMAL LOW (ref 39.0–52.0)
Hemoglobin: 12.1 g/dL — ABNORMAL LOW (ref 13.0–17.0)
Immature Granulocytes: 1 %
Lymphocytes Relative: 42 %
Lymphs Abs: 2.9 10*3/uL (ref 0.7–4.0)
MCH: 31 pg (ref 26.0–34.0)
MCHC: 33.6 g/dL (ref 30.0–36.0)
MCV: 92.3 fL (ref 80.0–100.0)
Monocytes Absolute: 0.4 10*3/uL (ref 0.1–1.0)
Monocytes Relative: 5 %
Neutro Abs: 3.1 10*3/uL (ref 1.7–7.7)
Neutrophils Relative %: 44 %
Platelets: 210 10*3/uL (ref 150–400)
RBC: 3.9 MIL/uL — ABNORMAL LOW (ref 4.22–5.81)
RDW: 12.6 % (ref 11.5–15.5)
WBC: 7 10*3/uL (ref 4.0–10.5)
nRBC: 0 % (ref 0.0–0.2)

## 2018-09-17 LAB — HEPATIC FUNCTION PANEL
ALT: 78 U/L — ABNORMAL HIGH (ref 0–44)
AST: 48 U/L — ABNORMAL HIGH (ref 15–41)
Albumin: 3.3 g/dL — ABNORMAL LOW (ref 3.5–5.0)
Alkaline Phosphatase: 68 U/L (ref 38–126)
Bilirubin, Direct: 0.1 mg/dL (ref 0.0–0.2)
Indirect Bilirubin: 0.6 mg/dL (ref 0.3–0.9)
Total Bilirubin: 0.7 mg/dL (ref 0.3–1.2)
Total Protein: 6.6 g/dL (ref 6.5–8.1)

## 2018-09-17 LAB — TROPONIN I: Troponin I: 0.03 ng/mL (ref <0.03)

## 2018-09-17 LAB — BRAIN NATRIURETIC PEPTIDE: B Natriuretic Peptide: 158 pg/mL — ABNORMAL HIGH (ref 0.0–100.0)

## 2018-09-17 LAB — SARS CORONAVIRUS 2: SARS Coronavirus 2: NOT DETECTED

## 2018-09-17 MED ORDER — ACETAMINOPHEN 325 MG PO TABS: 650.0000 mg | ORAL_TABLET | ORAL | Status: DC | PRN

## 2018-09-17 MED ORDER — ENOXAPARIN SODIUM 40 MG/0.4ML ~~LOC~~ SOLN
40.0000 mg | SUBCUTANEOUS | Status: DC
  Administered 2018-09-18: 40 mg via SUBCUTANEOUS
  Filled 2018-09-17: qty 0.4

## 2018-09-17 MED ORDER — SODIUM CHLORIDE 0.9 % IV SOLN: 250.0000 mL | INTRAVENOUS | Status: DC | PRN

## 2018-09-17 MED ORDER — FUROSEMIDE 10 MG/ML IJ SOLN
20.0000 mg | Freq: Once | INTRAMUSCULAR | Status: AC
  Administered 2018-09-17: 20 mg via INTRAVENOUS
  Filled 2018-09-17: qty 2

## 2018-09-17 MED ORDER — SODIUM CHLORIDE 0.9% FLUSH: 3.0000 mL | INTRAVENOUS | Status: DC | PRN

## 2018-09-17 MED ORDER — SODIUM CHLORIDE 0.9% FLUSH
3.0000 mL | Freq: Two times a day (BID) | INTRAVENOUS | Status: DC
  Administered 2018-09-18 – 2018-09-21 (×8): 3 mL via INTRAVENOUS

## 2018-09-17 MED ORDER — ALBUTEROL SULFATE (2.5 MG/3ML) 0.083% IN NEBU
3.0000 mL | INHALATION_SOLUTION | Freq: Four times a day (QID) | RESPIRATORY_TRACT | Status: DC | PRN
  Administered 2018-09-18: 3 mL via RESPIRATORY_TRACT
  Filled 2018-09-17: qty 3

## 2018-09-17 NOTE — Progress Notes (Deleted)
error 

## 2018-09-17 NOTE — H&P (Addendum)
[facility] Admission History and Physical Service Pager: [phone]  Patient name: Todd Griffith Medical record number: [id number] Date of birth: [birth year] Age: 28 y.o. Gender: male  Primary Care Provider: Patient, No Pcp Per Consultants: None Code Status: Full  Preferred Emergency Contact: Leonor Liv, Girlfriend, [phone]  Chief Complaint: BLE edema and orthopnea  Assessment and Plan: Todd Griffith is a 28 y.o. male presenting with worsening BLE edema and orthopnea. PMH is significant for morbid obesity and asthma.  Volume overload, likely 2/2 new-onset CHF Reports 1 week of progressively worsening bilateral lower extremity edema.  Also notes 3-4 pillow orthopnea for the last few months with one episode of PND in February.  On exam, patient with 2+ bilateral lower extremity pitting edema, bibasilar crackles.  BNP elevated to 158, CXR with cardiomegaly and vascular congestion.  ED provider ordered Lasix 20 mg IV as patient is Lasix nave and will assess urine output following administration.  Troponin also elevated to 0.03, likely secondary to fluid overload, but will continue to trend.  Patient does report a history of exertional chest pain, but denies any at present.  Suspect that patient's fluid overload is likely secondary to new onset CHF.  Patient states that 3 weeks ago, before he noticed lower extremity edema, his weight was 375 pounds.  Patient has not yet been weighed yet this hospitalization.  He is mildly tachycardic on admission to 104, Wells score 1.5, low risk for PE.  Suspect tachycardia likely secondary to volume overload as well.  Patient does have very mild hypoalbuminemia to 3.3, but would not expect third spacing to this extent with that minimal decrease.  LFTs also mildly elevated, likely secondary to congestive hepatopathy.  Given patient's morbid obesity, would expect that there may also be OHS contributing to  CHF. -Admit to observation, Dr. Saul Fordyce service -Cardiac monitoring -Continuous pulse ox -IV Lasix 20 mg x 1, titrate as needed -Daily weights -Strict I&O's, monitor urine output -Trend troponins -A1c -A.m. BMP, CBC -A.m. EKG -Echo -Regular diet -Lovenox for DVT prophylaxis  Elevated troponin  Trop 0.03 on admission.  EKG sinus tachycardia with borderline prolonged Qtc of 482, was 487 in May 2020.  Notes chronic exertional chest pain that resolves with rest, but denies chest pain at present.  Does state that he had chest pain earlier today when he was exerting himself.  Likely 2/2 fluid overload. -Trend troponins -A.m. EKG -Cardiac monitoring  Elevated AST/ALT AST 48, ALT 78.  Likely 2/2 congestive hepatopathy in setting of new-onset CHF.  Will also obtain acute hepatitis panel to rule out hepatitis.   - acute hepatitis panel, HIV - diuresis per above  Normocytic Anemia Hgb 12.1, down from 13 3 weeks ago.  MCV 92.3.  Patient does not endorse hematuria, melena, or hematochezia.  Will obtain anemia panel to assess further and order FOBT. - anemia panel - FOBT - monitor CBC  Asthma On albuterol prn at home, prescribed by ED visits.  On exam no wheezing auscultated. - cont home albuterol prn, consider starting controller as outpatient  Social Needs - SW consult for PCP, did offer patient follow up at [facility] as well  Left Ear Discomfort Notes some fullness in left ear for the last week.  No fevers.  Occasionally "pops."  Attempted to examined in ED, but otoscope broken in room. - assess in AM  FEN/GI: Regular Prophylaxis: Lovenox  Disposition: admit to obs, plan for diuresis and echo in AM  History of Present  Illness:  Todd Griffith is a 28 y.o. male presenting with 1 to 2 weeks of progressively worsening bilateral lower extremity edema and several months months of 3-4 pillow orthopnea with one episode of "gasping for air" during sleep in February.  He denies pain in  his legs, but states that he has noticed that they have been getting progressively more swollen.  He states that he has been more active recently as he has been preparing for the birth of his new daughter, who was born on 6/17.  He also endorses aching substernal chest pain that occurs with exertion.  He denies chest pain at rest and denies chest pain on admission, but does state that he had some pain earlier today.  He notes that the pain is substernal in nature and occasionally moves to both arms.  He states that this improves with rest.  He denies any palpitations.  In ED, patient noted to have bilateral lower extremity pitting edema and vascular congestion as well as cardiomegaly on chest x-ray.  His vitals are stable and he was satting well on room air, but was recommended admission for work-up of possible new onset heart failure and diuresis.  IV Lasix 20 mg x 1 was ordered by ED provider, but not yet given.  Patient also notes a history of asthma, states that he uses his albuterol inhaler about 4 times a week.  He states that this is prescribed by the ED after coming in for asthma exacerbations.  Patient does not currently have a PCP.  He does state that he quit smoking in March, while planning for his new daughter to be born.  He states he was only a social smoker, smoked 2-3 black and milds per day, and only smoked about 1 to 2 days a week.  He states "I only smoked when I drink, and I only drink on the weekends."  Also notes some left ear discomfort which she notes is a fullness.  States that it also occasionally "pops".  Denies fevers.  Review Of Systems: Per HPI with the following additions:   Review of Systems  Constitutional: Negative for chills and fever.  HENT: Positive for ear pain (left). Negative for sore throat.   Eyes: Negative for blurred vision.  Respiratory: Negative for cough, shortness of breath and wheezing.   Cardiovascular: Positive for chest pain (exertional), orthopnea,  leg swelling and PND. Negative for palpitations.  Gastrointestinal: Negative for abdominal pain, blood in stool, diarrhea, melena, nausea and vomiting.  Genitourinary: Negative for dysuria and hematuria.  Neurological: Negative for weakness and headaches.  Psychiatric/Behavioral: Negative for substance abuse.    There are no active problems to display for this patient.   Past Medical History: Past Medical History:  Diagnosis Date  . Asthma     Past Surgical History: No past surgical history on file.  Social History: Social History   Tobacco Use  . Smoking status: Current Some Day Smoker    Types: Cigarettes  . Smokeless tobacco: Never Used  Substance Use Topics  . Alcohol use: Never    Frequency: Never    Comment: socially   . Drug use: Never   Additional social history: Social drinker on weekends, quit smoking in March 2020.  His girlfriend is currently in the hospital, as their new daughter was born on 6/17.  Please also refer to relevant sections of EMR.  Family History: No family history on file.   Allergies and Medications: No Known Allergies No current facility-administered  medications on file prior to encounter.    Current Outpatient Medications on File Prior to Encounter  Medication Sig Dispense Refill  . albuterol (PROVENTIL HFA;VENTOLIN HFA) 108 (90 Base) MCG/ACT inhaler Inhale 1-2 puffs into the lungs every 6 (six) hours as needed for wheezing or shortness of breath.    Marland Kitchen. ipratropium-albuterol (DUONEB) 0.5-2.5 (3) MG/3ML SOLN Take 3 mLs by nebulization every 4 (four) hours as needed. 360 mL 0  . predniSONE (DELTASONE) 20 MG tablet 2 tabs po daily x 4 days 8 tablet 0    Objective: BP (!) 152/99 (BP Location: Right Arm)   Pulse 100   Temp 98.4 F (36.9 C) (Oral)   Resp 18   SpO2 100%   Physical Exam Constitutional:      General: He is not in acute distress.    Appearance: He is obese.  HENT:     Head: Normocephalic and atraumatic.     Ears:      Comments: Attempted to exam, but otoscope broken in room Neck:     Musculoskeletal: Normal range of motion.  Cardiovascular:     Rate and Rhythm: Regular rhythm. Tachycardia present.     Pulses: Normal pulses.     Heart sounds: No murmur. No friction rub. No gallop.      Comments: Distant heart sounds Pulmonary:     Effort: Pulmonary effort is normal. No respiratory distress.     Breath sounds: Normal breath sounds. No wheezing.  Abdominal:     General: Bowel sounds are normal.     Palpations: Abdomen is soft.     Tenderness: There is no abdominal tenderness.  Musculoskeletal:     Right lower leg: Edema (2+ pitting edema) present.     Left lower leg: Edema (2+ pitting edema) present.  Skin:    General: Skin is warm and dry.  Neurological:     Mental Status: He is alert and oriented to person, place, and time.  Psychiatric:        Mood and Affect: Mood normal.        Behavior: Behavior normal.      Labs and Imaging: CBC BMET  Recent Labs  Lab 09/17/18 1941  WBC 7.0  HGB 12.1*  HCT 36.0*  PLT 210   Recent Labs  Lab 09/17/18 1941  NA 138  K 3.6  CL 107  CO2 25  BUN 13  CREATININE 1.09  GLUCOSE 128*  CALCIUM 8.7*     Dg Chest Portable 1 View  Result Date: 09/17/2018 CLINICAL DATA:  Swelling in feet. EXAM: PORTABLE CHEST 1 VIEW COMPARISON:  Aug 21, 2018 FINDINGS: The cardiac size is significantly enlarged. There is vascular congestion without overt pulmonary edema. Evaluation is limited by suboptimal technique and patient body habitus. There is no pneumothorax. No acute osseous abnormality. IMPRESSION: Cardiomegaly with vascular congestion. Electronically Signed   By: Katherine Mantlehristopher  Green M.D.   On: 09/17/2018 20:18   Meccariello, Solmon IceBailey J, DO 09/17/2018, 9:02 PM PGY-1, [facility] FPTS Intern pager: [phone], text pages welcome  FPTS Upper-Level Resident Addendum   I have independently interviewed and examined the patient. I have discussed the  above with the original author and agree with their documentation. My edits for correction/addition/clarification are in blue. Please see also any attending notes.    Marthenia RollingScott Avigail Pilling, DO PGY-2, [facility] 09/18/2018 6:35 AM  FPTS Service pager: [phone] (text pages welcome through [facility])

## 2018-09-17 NOTE — ED Provider Notes (Signed)
[facility] EMERGENCY DEPARTMENT Provider Note   CSN: [id number] Arrival date & time: 09/17/18  1857    History   Chief Complaint Chief Complaint  Patient presents with  . Leg Swelling    HPI Todd Griffith is a 28 y.o. male. Patient has a past medical history of asthma, has a few inhalers at home otherwise has no significant past medical history, believes he might have undiagnosed sleep apnea as he is a very noisy sleeper, is trying to set up sleep study as an outpatient for further evaluation and care Patient has had worsening bilateral lower extremity edema over the last week.  Patient denies any pain in lower extremities, no trauma to bilateral lower extremities, no abdominal pain or chest pain.  Intermittent shortness of breath but complicated by history of asthma with inhaler use, large body habitus.  No worsening shortness of breath with exertion.  Shortness of breath is worse when patient lies flat, uses 3 pillows at night to sleep with.  Patient states elevating legs helps intermittently.  Patient denies any infectious review of systems, no fevers, chills, no significant sick contacts, no cough, no sputum production.  Patient denies any history of arthritis, gout, any joint pain or focal bony tenderness.  Patient still able to ambulate at baseline without difficulty.    The history is provided by the patient and medical records.    Past Medical History:  Diagnosis Date  . Asthma     There are no active problems to display for this patient.   No past surgical history on file.      Home Medications    Prior to Admission medications   Medication Sig Start Date End Date Taking? Authorizing Provider  albuterol (PROVENTIL HFA;VENTOLIN HFA) 108 (90 Base) MCG/ACT inhaler Inhale 1-2 puffs into the lungs every 6 (six) hours as needed for wheezing or shortness of breath.   Yes [provider]    Family History No family history on file.  Social History Social History   Tobacco Use  . Smoking status: Current Some Day Smoker    Types: Cigarettes  . Smokeless tobacco: Never Used  Substance Use Topics  . Alcohol use: Never    Frequency: Never    Comment: socially   . Drug use: Never     Allergies   Patient has no known allergies.   Review of Systems Review of Systems  All other systems reviewed and are negative.    Physical Exam Updated Vital Signs BP (!) 152/99 (BP Location: Right Arm)   Pulse 100   Temp 98.4 F (36.9 C) (Oral)   Resp 18   SpO2 100%   Physical Exam Vitals signs and nursing note reviewed.  Constitutional:      Appearance: He is well-developed.  HENT:     Head: Normocephalic and atraumatic.  Eyes:     Conjunctiva/sclera: Conjunctivae normal.  Neck:     Musculoskeletal: Neck supple.  Cardiovascular:     Rate and Rhythm: Normal rate and regular rhythm.  Pulmonary:     Effort: Pulmonary effort is normal. No respiratory distress.     Breath sounds: No stridor. No wheezing or rhonchi.  Abdominal:     Palpations: Abdomen is soft.     Tenderness: There is no abdominal tenderness.  Musculoskeletal:     Right lower leg: Edema present.     Left lower leg: Edema present.     Comments: 2+ bilateral lower extremity pitting edema up to  the midcalf   Sensation intact bilateral lower extremities, patient able to move all extremities spontaneously without difficulty, 2+ pulses throughout  Skin:    General: Skin is warm and dry.  Neurological:     Mental Status: He is alert and oriented to person, place, and time.      ED Treatments / Results  Labs (all labs ordered are listed, but only abnormal results are displayed) Labs Reviewed  BASIC METABOLIC PANEL - Abnormal; Notable for the following components:      Result Value   Glucose, Bld 128 (*)    Calcium 8.7 (*)    All other components within normal limits  BRAIN NATRIURETIC PEPTIDE - Abnormal; Notable for the following  components:   B Natriuretic Peptide 158.0 (*)    All other components within normal limits  TROPONIN I - Abnormal; Notable for the following components:   Troponin I 0.03 (*)    All other components within normal limits  HEPATIC FUNCTION PANEL - Abnormal; Notable for the following components:   Albumin 3.3 (*)    AST 48 (*)    ALT 78 (*)    All other components within normal limits  CBC WITH DIFFERENTIAL/PLATELET - Abnormal; Notable for the following components:   RBC 3.90 (*)    Hemoglobin 12.1 (*)    HCT 36.0 (*)    Eosinophils Absolute 0.6 (*)    All other components within normal limits  NOVEL CORONAVIRUS, NAA (HOSPITAL ORDER, SEND-OUT TO REF LAB)    EKG EKG Interpretation  Date/Time:  Thursday September 17 2018 19:06:22 EDT Ventricular Rate:  104 PR Interval:    QRS Duration: 95 QT Interval:  366 QTC Calculation: 482 R Axis:   51 Text Interpretation:  Sinus tachycardia Borderline prolonged QT interval No significant change since last tracing Confirmed by Gwyneth SproutPlunkett, Whitney ([id number]) on 09/17/2018 7:23:36 PM   Radiology Dg Chest Portable 1 View  Result Date: 09/17/2018 CLINICAL DATA:  Swelling in feet. EXAM: PORTABLE CHEST 1 VIEW COMPARISON:  Aug 21, 2018 FINDINGS: The cardiac size is significantly enlarged. There is vascular congestion without overt pulmonary edema. Evaluation is limited by suboptimal technique and patient body habitus. There is no pneumothorax. No acute osseous abnormality. IMPRESSION: Cardiomegaly with vascular congestion. Electronically Signed   By: Katherine Mantlehristopher  Green M.D.   On: 09/17/2018 20:18    Procedures Procedures (including critical care time)  Medications Ordered in ED Medications  furosemide (LASIX) injection 20 mg (has no administration in time range)     Initial Impression / Assessment and Plan / ED Course  I have reviewed the triage vital signs and the nursing notes.  Pertinent labs & imaging results that were available during my care of  the patient were reviewed by me and considered in my medical decision making (see chart for details).        Medical Decision Making: Todd Griffith is a 28 y.o. male who presented to the ED today with bilateral lower extremity swelling.  Reviewed and confirmed nursing documentation for past medical history, family history, social history.  On my initial exam, the pt was calm, cooperative, conversant, follows commands appropriately, GCS 15, not tachycardic, not hypotensive, afebrile, no increased work of breathing or respiratory distress, no signs of impending respiratory failure.   Bilateral lower extremity swelling could be secondary to new onset heart failure, will obtain BNP, troponin, chest x-ray for further evaluation, could be secondary to lymphedema versus venous stasis, patient endorses being on his feet more recently,  increased activity exertion given him and his significant other are expecting a baby so he has been doing more work to prep for new child.  No significant risk factors for DVT or VTE, no history of blood clots, no significant family history of blood clot, patient versus family history of diabetes and high blood pressure, swelling is symmetric, no obvious overlying skin changes, no erythema.  No history of trauma to bilateral lower extremities, no history of arthralgia, arthritis or gout.  Chest x-ray showed cardiomegaly with vascular congestion BNP 150 Troponin 0.03 We will give patient IV diuretics  All radiology and laboratory studies reviewed independently and with my attending physician, agree with reading provided by radiologist unless otherwise noted.  Upon reassessing patient, patient was calm, resting comfortably, no new complaints, will admit patient for heart failure work-up, further evaluation and care given patient does not have an outpatient PCP Based on the above findings, I believe patient requires admission. Patient admitted. . The above care  was discussed with and agreed upon by my attending physician. Emergency Department Medication Summary:  Medications  furosemide (LASIX) injection 20 mg (has no administration in time range)        Final Clinical Impressions(s) / ED Diagnoses   Final diagnoses:  None    ED Discharge Orders    None       Erick Alley, MD 09/17/18 2117    Gwyneth Sprout, MD 09/17/18 2121

## 2018-09-17 NOTE — Significant Event (Addendum)
For 6/19 0700 to 1900, please page [phone]  Pager [phone] is not working.  Brad Thompson, MD, MS FAMILY MEDICINE RESIDENT - PGY2 09/18/2018 7:42 AM 

## 2018-09-17 NOTE — ED Notes (Signed)
ED TO INPATIENT HANDOFF REPORT  ED Nurse Name and Phone #: Magnus Ivan, RN 5  S Name/Age/Gender Todd Griffith 28 y.o. male Room/Bed: 018C/018C  Code Status   Code Status: Full Code  Home/SNF/Other Home Patient oriented to: time and situation Is this baseline? Yes   Triage Complete: Triage complete  Chief Complaint feet swelling  Triage Note C/O bilateral foot swelling & tenderness since Monday; worst w/ walking.    Allergies No Known Allergies  Level of Care/Admitting Diagnosis ED Disposition    ED Disposition Condition Comment   Admit  Hospital Area: [facility] [[id number]]  Level of Care: Telemetry Medical [104]  Covid Evaluation: N/A  Diagnosis: Volume overload [161096]  Admitting Physician: Westley Chandler [[id number]]  Attending Physician: Westley Chandler [[id number]]  PT Class (Do Not Modify): Observation [104]  PT Acc Code (Do Not Modify): Observation [10022]       B Medical/Surgery History Past Medical History:  Diagnosis Date  . Asthma    No past surgical history on file.   A IV Location/Drains/Wounds Patient Lines/Drains/Airways Status   Active Line/Drains/Airways    Name:   Placement date:   Placement time:   Site:   Days:   Peripheral IV 09/17/18 Left Antecubital   09/17/18    2155    Antecubital   less than 1          Intake/Output Last 24 hours No intake or output data in the 24 hours ending 09/17/18 2159  Labs/Imaging Results for orders placed or performed during the hospital encounter of 09/17/18 (from the past 48 hour(s))  Brain natriuretic peptide     Status: Abnormal   Collection Time: 09/17/18  7:26 PM  Result Value Ref Range   B Natriuretic Peptide 158.0 (H) 0.0 - 100.0 pg/mL    Comment: Performed at [facility], [address], [place], [place] [address]  Basic metabolic panel     Status: Abnormal   Collection Time: 09/17/18  7:41 PM  Result Value Ref Range   Sodium 138 135 - 145 mmol/L   Potassium 3.6 3.5 - 5.1 mmol/L   Chloride 107 98 - 111 mmol/L   CO2 25 22 - 32 mmol/L   Glucose, Bld 128 (H) 70 - 99 mg/dL   BUN 13 6 - 20 mg/dL   Creatinine, Ser 1.30 0.61 - 1.24 mg/dL   Calcium 8.7 (L) 8.9 - 10.3 mg/dL   GFR calc non Af Amer >60 >60 mL/min   GFR calc Af Amer >60 >60 mL/min   Anion gap 6 5 - 15    Comment: Performed at [facility], [address] [place], [place] [address]  Troponin I - ONCE - STAT     Status: Abnormal   Collection Time: 09/17/18  7:41 PM  Result Value Ref Range   Troponin I 0.03 (HH) <0.03 ng/mL    Comment: CRITICAL RESULT CALLED TO, READ BACK BY AND VERIFIED WITH: T PHILLIPS RN AT 2037 ON [id number] BY K FORSYTH Performed at [facility], [address], [place], [place] [address]   Hepatic function panel     Status: Abnormal   Collection Time: 09/17/18  7:41 PM  Result Value Ref Range   Total Protein 6.6 6.5 - 8.1 g/dL   Albumin 3.3 (L) 3.5 - 5.0 g/dL   AST 48 (H) 15 - 41 U/L   ALT 78 (H) 0 - 44 U/L   Alkaline Phosphatase 68 38 -  126 U/L   Total Bilirubin 0.7 0.3 - 1.2 mg/dL   Bilirubin, Direct 0.1 0.0 - 0.2 mg/dL   Indirect Bilirubin 0.6 0.3 - 0.9 mg/dL    Comment: Performed at [facility], [place] [address], [place], [place] [address]  CBC with Differential     Status: Abnormal   Collection Time: 09/17/18  7:41 PM  Result Value Ref Range   WBC 7.0 4.0 - 10.5 K/uL   RBC 3.90 (L) 4.22 - 5.81 MIL/uL   Hemoglobin 12.1 (L) 13.0 - 17.0 g/dL   HCT 36.0 (L) 39.0 - 52.0 %   MCV 92.3 80.0 - 100.0 fL   MCH 31.0 26.0 - 34.0 pg   MCHC 33.6 30.0 - 36.0 g/dL   RDW 12.6 11.5 - 15.5 %   Platelets 210 150 - 400 K/uL   nRBC 0.0 0.0 - 0.2 %   Neutrophils Relative % 44 %   Neutro Abs 3.1 1.7 - 7.7 K/uL   Lymphocytes Relative 42 %   Lymphs Abs 2.9 0.7 - 4.0 K/uL   Monocytes Relative 5 %   Monocytes Absolute 0.4 0.1 - 1.0 K/uL   Eosinophils Relative 8 %   Eosinophils Absolute 0.6 (H) 0.0 - 0.5 K/uL   Basophils Relative 0 %    Basophils Absolute 0.0 0.0 - 0.1 K/uL   Immature Granulocytes 1 %   Abs Immature Granulocytes 0.04 0.00 - 0.07 K/uL    Comment: Performed at [place] [address] [place], [place] [address]   Dg Chest Portable 1 View  Result Date: 09/17/2018 CLINICAL DATA:  Swelling in feet. EXAM: PORTABLE CHEST 1 VIEW COMPARISON:  Aug 21, 2018 FINDINGS: The cardiac size is significantly enlarged. There is vascular congestion without overt pulmonary edema. Evaluation is limited by suboptimal technique and patient body habitus. There is no pneumothorax. No acute osseous abnormality. IMPRESSION: Cardiomegaly with vascular congestion. Electronically Signed   By: Constance Holster M.D.   On: 09/17/2018 20:18    Pending Labs Unresulted Labs (From admission, onward)    Start     Ordered   09/18/18 6073  Basic metabolic panel  Daily,   R     09/17/18 2153   09/18/18 0500  CBC  Tomorrow morning,   R     09/17/18 2153   09/18/18 0500  Vitamin B12  (Anemia Panel (PNL))  Tomorrow morning,   R     09/17/18 2153   09/18/18 0500  Folate  (Anemia Panel (PNL))  Tomorrow morning,   R     09/17/18 2153   09/18/18 0500  Iron and TIBC  (Anemia Panel (PNL))  Tomorrow morning,   R     09/17/18 2153   09/18/18 0500  Ferritin  (Anemia Panel (PNL))  Tomorrow morning,   R     09/17/18 2153   09/18/18 0500  Reticulocytes  (Anemia Panel (PNL))  Tomorrow morning,   R     09/17/18 2153   09/18/18 0500  Hepatitis panel, acute  Tomorrow morning,   R     09/17/18 2153   09/17/18 2146  HIV antibody (Routine Testing)  Once,   STAT     09/17/18 2153   09/17/18 2136  Hemoglobin A1c  Add-on,   AD     09/17/18 2135   09/17/18 2119  SARS Coronavirus 2 (CEPHEID - Performed in [place] hospital lab), [facility] Order  Once,   STAT    Question:  Rule Out  Answer:  Yes   09/17/18 2118          Vitals/Pain Today's Vitals   09/17/18 1907 09/17/18 2154  BP: (!) 152/99 (!) 159/104  Pulse: 100 (!) 101  Resp: 18 18  Temp:  98.4 F (36.9 C)   TempSrc: Oral   SpO2: 100% 99%    Isolation Precautions No active isolations  Medications Medications  furosemide (LASIX) injection 20 mg (has no administration in time range)  albuterol (PROVENTIL) (2.5 MG/3ML) 0.083% nebulizer solution 3 mL (has no administration in time range)  sodium chloride flush (NS) 0.9 % injection 3 mL (has no administration in time range)  sodium chloride flush (NS) 0.9 % injection 3 mL (has no administration in time range)  0.9 %  sodium chloride infusion (has no administration in time range)  acetaminophen (TYLENOL) tablet 650 mg (has no administration in time range)  enoxaparin (LOVENOX) injection 40 mg (has no administration in time range)    Mobility walks Low fall risk   Focused Assessments Cardiac Assessment Handoff:  Cardiac Rhythm: Sinus tachycardia Lab Results  Component Value Date   TROPONINI 0.03 (HH) 09/17/2018   No results found for: DDIMER Does the Patient currently have chest pain? No     R Recommendations: See Admitting Provider Note  Report given to:   Additional Notes: Here bilateral foot swelling; denies PMH. 20 g lac; Lasix 20 mg given.

## 2018-09-17 NOTE — ED Triage Notes (Signed)
C/O bilateral foot swelling & tenderness since Monday; worst w/ walking.

## 2018-09-18 ENCOUNTER — Observation Stay (HOSPITAL_BASED_OUTPATIENT_CLINIC_OR_DEPARTMENT_OTHER): Payer: BC Managed Care – PPO

## 2018-09-18 ENCOUNTER — Encounter (HOSPITAL_COMMUNITY): Payer: Self-pay | Admitting: Family Medicine

## 2018-09-18 DIAGNOSIS — F1010 Alcohol abuse, uncomplicated: Secondary | ICD-10-CM | POA: Diagnosis present

## 2018-09-18 DIAGNOSIS — R0601 Orthopnea: Secondary | ICD-10-CM | POA: Diagnosis not present

## 2018-09-18 DIAGNOSIS — I110 Hypertensive heart disease with heart failure: Secondary | ICD-10-CM | POA: Diagnosis not present

## 2018-09-18 DIAGNOSIS — J45909 Unspecified asthma, uncomplicated: Secondary | ICD-10-CM | POA: Diagnosis not present

## 2018-09-18 DIAGNOSIS — T502X5A Adverse effect of carbonic-anhydrase inhibitors, benzothiadiazides and other diuretics, initial encounter: Secondary | ICD-10-CM | POA: Diagnosis not present

## 2018-09-18 DIAGNOSIS — I340 Nonrheumatic mitral (valve) insufficiency: Secondary | ICD-10-CM

## 2018-09-18 DIAGNOSIS — I5041 Acute combined systolic (congestive) and diastolic (congestive) heart failure: Secondary | ICD-10-CM | POA: Diagnosis not present

## 2018-09-18 DIAGNOSIS — D649 Anemia, unspecified: Secondary | ICD-10-CM | POA: Diagnosis not present

## 2018-09-18 DIAGNOSIS — Z833 Family history of diabetes mellitus: Secondary | ICD-10-CM | POA: Diagnosis not present

## 2018-09-18 DIAGNOSIS — I2510 Atherosclerotic heart disease of native coronary artery without angina pectoris: Secondary | ICD-10-CM | POA: Diagnosis present

## 2018-09-18 DIAGNOSIS — I371 Nonrheumatic pulmonary valve insufficiency: Secondary | ICD-10-CM | POA: Diagnosis not present

## 2018-09-18 DIAGNOSIS — Z21 Asymptomatic human immunodeficiency virus [HIV] infection status: Secondary | ICD-10-CM | POA: Diagnosis not present

## 2018-09-18 DIAGNOSIS — Z20828 Contact with and (suspected) exposure to other viral communicable diseases: Secondary | ICD-10-CM | POA: Diagnosis not present

## 2018-09-18 DIAGNOSIS — B20 Human immunodeficiency virus [HIV] disease: Secondary | ICD-10-CM | POA: Diagnosis not present

## 2018-09-18 DIAGNOSIS — M7989 Other specified soft tissue disorders: Secondary | ICD-10-CM | POA: Diagnosis present

## 2018-09-18 DIAGNOSIS — I420 Dilated cardiomyopathy: Secondary | ICD-10-CM | POA: Diagnosis not present

## 2018-09-18 DIAGNOSIS — I5021 Acute systolic (congestive) heart failure: Secondary | ICD-10-CM | POA: Diagnosis not present

## 2018-09-18 DIAGNOSIS — R609 Edema, unspecified: Secondary | ICD-10-CM | POA: Diagnosis not present

## 2018-09-18 DIAGNOSIS — E8770 Fluid overload, unspecified: Secondary | ICD-10-CM | POA: Diagnosis not present

## 2018-09-18 DIAGNOSIS — Z6841 Body Mass Index (BMI) 40.0 and over, adult: Secondary | ICD-10-CM | POA: Diagnosis not present

## 2018-09-18 DIAGNOSIS — K761 Chronic passive congestion of liver: Secondary | ICD-10-CM | POA: Diagnosis not present

## 2018-09-18 DIAGNOSIS — F419 Anxiety disorder, unspecified: Secondary | ICD-10-CM | POA: Diagnosis present

## 2018-09-18 DIAGNOSIS — Z79899 Other long term (current) drug therapy: Secondary | ICD-10-CM | POA: Diagnosis not present

## 2018-09-18 DIAGNOSIS — I428 Other cardiomyopathies: Secondary | ICD-10-CM | POA: Diagnosis not present

## 2018-09-18 DIAGNOSIS — Z87891 Personal history of nicotine dependence: Secondary | ICD-10-CM | POA: Diagnosis not present

## 2018-09-18 DIAGNOSIS — I517 Cardiomegaly: Secondary | ICD-10-CM | POA: Diagnosis not present

## 2018-09-18 DIAGNOSIS — G4733 Obstructive sleep apnea (adult) (pediatric): Secondary | ICD-10-CM | POA: Diagnosis present

## 2018-09-18 DIAGNOSIS — Z7952 Long term (current) use of systemic steroids: Secondary | ICD-10-CM | POA: Diagnosis not present

## 2018-09-18 DIAGNOSIS — Z8249 Family history of ischemic heart disease and other diseases of the circulatory system: Secondary | ICD-10-CM | POA: Diagnosis not present

## 2018-09-18 LAB — CBC
HCT: 37.5 % — ABNORMAL LOW (ref 39.0–52.0)
Hemoglobin: 12.7 g/dL — ABNORMAL LOW (ref 13.0–17.0)
MCH: 30.8 pg (ref 26.0–34.0)
MCHC: 33.9 g/dL (ref 30.0–36.0)
MCV: 90.8 fL (ref 80.0–100.0)
Platelets: 224 10*3/uL (ref 150–400)
RBC: 4.13 MIL/uL — ABNORMAL LOW (ref 4.22–5.81)
RDW: 12.7 % (ref 11.5–15.5)
WBC: 7.7 10*3/uL (ref 4.0–10.5)
nRBC: 0 % (ref 0.0–0.2)

## 2018-09-18 LAB — LIPID PANEL
Cholesterol: 158 mg/dL (ref 0–200)
HDL: 31 mg/dL — ABNORMAL LOW (ref >40)
LDL Cholesterol: 90 mg/dL (ref 0–99)
Total CHOL/HDL Ratio: 5.1 RATIO
Triglycerides: 183 mg/dL — ABNORMAL HIGH (ref <150)
VLDL: 37 mg/dL (ref 0–40)

## 2018-09-18 LAB — RETICULOCYTES
Immature Retic Fract: 19.6 % — ABNORMAL HIGH (ref 2.3–15.9)
RBC.: 4.13 MIL/uL — ABNORMAL LOW (ref 4.22–5.81)
Retic Count, Absolute: 148.3 10*3/uL (ref 19.0–186.0)
Retic Ct Pct: 3.6 % — ABNORMAL HIGH (ref 0.4–3.1)

## 2018-09-18 LAB — IRON AND TIBC
Iron: 56 ug/dL (ref 45–182)
Saturation Ratios: 18 % (ref 17.9–39.5)
TIBC: 309 ug/dL (ref 250–450)
UIBC: 253 ug/dL

## 2018-09-18 LAB — BASIC METABOLIC PANEL
Anion gap: 10 (ref 5–15)
BUN: 13 mg/dL (ref 6–20)
CO2: 25 mmol/L (ref 22–32)
Calcium: 8.9 mg/dL (ref 8.9–10.3)
Chloride: 104 mmol/L (ref 98–111)
Creatinine, Ser: 1.1 mg/dL (ref 0.61–1.24)
GFR calc Af Amer: >60 mL/min (ref >60)
GFR calc non Af Amer: >60 mL/min (ref >60)
Glucose, Bld: 103 mg/dL — ABNORMAL HIGH (ref 70–99)
Potassium: 3.5 mmol/L (ref 3.5–5.1)
Sodium: 139 mmol/L (ref 135–145)

## 2018-09-18 LAB — TROPONIN I
Troponin I: 0.03 ng/mL (ref <0.03)
Troponin I: 0.03 ng/mL (ref <0.03)

## 2018-09-18 LAB — RAPID URINE DRUG SCREEN, HOSP PERFORMED
Amphetamines: NOT DETECTED
Barbiturates: NOT DETECTED
Benzodiazepines: NOT DETECTED
Cocaine: NOT DETECTED
Opiates: NOT DETECTED
Tetrahydrocannabinol: NOT DETECTED

## 2018-09-18 LAB — TSH: TSH: 2.117 u[IU]/mL (ref 0.350–4.500)

## 2018-09-18 LAB — HEMOGLOBIN A1C
Hgb A1c MFr Bld: 4.5 % — ABNORMAL LOW (ref 4.8–5.6)
Mean Plasma Glucose: 82.45 mg/dL

## 2018-09-18 LAB — FOLATE: Folate: 9.4 ng/mL (ref >5.9)

## 2018-09-18 LAB — ECHOCARDIOGRAM COMPLETE
Height: 70 in
Weight: 6412.74 oz

## 2018-09-18 LAB — VITAMIN B12: Vitamin B-12: 257 pg/mL (ref 180–914)

## 2018-09-18 LAB — FERRITIN: Ferritin: 246 ng/mL (ref 24–336)

## 2018-09-18 MED ORDER — PERFLUTREN LIPID MICROSPHERE
1.0000 mL | INTRAVENOUS | Status: AC | PRN
  Administered 2018-09-18: 3 mL via INTRAVENOUS
  Filled 2018-09-18: qty 10

## 2018-09-18 MED ORDER — FUROSEMIDE 10 MG/ML IJ SOLN
40.0000 mg | Freq: Two times a day (BID) | INTRAMUSCULAR | Status: DC
  Administered 2018-09-18 – 2018-09-20 (×4): 40 mg via INTRAVENOUS
  Filled 2018-09-18 (×4): qty 4

## 2018-09-18 MED ORDER — LIVING BETTER WITH HEART FAILURE BOOK: Freq: Once | Status: DC

## 2018-09-18 MED ORDER — FUROSEMIDE 10 MG/ML IJ SOLN
20.0000 mg | Freq: Every day | INTRAMUSCULAR | Status: DC
  Administered 2018-09-18: 20 mg via INTRAVENOUS
  Filled 2018-09-18: qty 2

## 2018-09-18 MED ORDER — CARVEDILOL 3.125 MG PO TABS
3.1250 mg | ORAL_TABLET | Freq: Two times a day (BID) | ORAL | Status: DC
  Administered 2018-09-19 – 2018-09-20 (×4): 3.125 mg via ORAL
  Filled 2018-09-18 (×4): qty 1

## 2018-09-18 MED ORDER — ENOXAPARIN SODIUM 100 MG/ML ~~LOC~~ SOLN
0.5000 mg/kg | Freq: Every day | SUBCUTANEOUS | Status: DC
  Administered 2018-09-18 – 2018-09-19 (×2): 90 mg via SUBCUTANEOUS
  Filled 2018-09-18 (×2): qty 1

## 2018-09-18 NOTE — Progress Notes (Signed)
Family Medicine Teaching Service Daily Progress Note Intern Pager: [phone]  Patient name: Todd Griffith Medical record number: [id number] Date of birth: 11-Aug-1990 Age: 28 y.o. Gender: male  Primary Care Provider: Patient, No Pcp Per Consultants: none Code Status: FULL  Pt Overview and Major Events to Date:  6/18-admission for new onset CHF  Assessment and Plan: Todd Griffith is a 28 y.o. male presenting with worsening BLE edema and orthopnea. PMH is significant for morbid obesity and asthma.  Volume overload, likely 2/2 new-onset CHF 1.6 L urine output from hospital admission.  Admission weight 181.6 kg.  BNP elevated to 158 (likely mildly suppressed due to obesity) physical exam shows mildly labored breathing on 2 L nasal cannula, 1+ LE edema up to the tibial tuberosity, difficult to auscultate lung sounds or assess JVD due to body habitus.  Currently satting well on room air. -Continue Lasix IV 20 mg -Echo  Elevated troponin  Troponins trending flat at 0.032.  EKG shows no Q waves, ST changes or T wave inversions.  Overnight telemetry is concerning for ST elevations although less accurate than an EKG.  Will defer to EKG and follow-up a.m. EKG. a.m. EKG shows no signs of ischemia (Q waves, ST changes, T wave inversions) -Cardiac monitoring  Elevated AST/ALT AST 48, ALT 78.  Likely 2/2 congestive hepatopathy. - acute hepatitis panel, HIV - diuresis per above  Nutrition He reported that he recognizes his weight is contributing to his health problems currently and would like to talk to someone about appropriate nutrition and weight loss. -Consult to nutrition  Normocytic Anemia Hgb 12.1,  MCV 92.3.  Iron 56, ferritin 246, TIBC 309.  No abnormalities and iron studies.  We will continue to monitor, potentially secondary to dilution/volume overload. - anemia panel - FOBT - monitor CBC  Asthma -Albuterol PRN  Social Needs - SW consult for PCP, did offer  patient follow up at [facility] as well  Left Ear Discomfort - assess in AM  FEN/GI: Regular Prophylaxis: Lovenox   Disposition: Possible discharge home 6/20  Subjective:  No acute events overnight.  He feels that his breathing has significantly improved compared to admission.  Though he appeared to have labored breathing reported that his breathing had returned to baseline.  He reports that he has no known history of obstructive sleep apnea or obesity hypoventilation syndrome although he does report snoring and occasional episodes of not breathing while asleep.  He reports that he has been told this by multiple observers.  Objective: Temp:  [98.4 F (36.9 C)-98.6 F (37 C)] 98.6 F (37 C) (06/18 2238) Pulse Rate:  [100-103] 101 (06/18 2238) Resp:  [16-18] 16 (06/18 2238) BP: (145-159)/(94-108) 154/108 (06/18 2238) SpO2:  [99 %-100 %] 100 % (06/18 2238) Weight:  [181.6 kg] 181.6 kg (06/18 2238) Physical Exam: General: Alert and cooperative.  Sitting up in bed resting in mild respiratory distress. HEENT: Difficult to assess JVD due to neck girth. Cardio: Distant heart sounds, difficult to assess due to body habitus. Pulm: Difficult to auscultate due to body habitus. Abdomen: Bowel sounds normal. Abdomen soft and non-tender.  Extremities: 1+ pitting edema up to the tibial tuberosity.  Radial pulse intact. Neuro: Cranial nerves grossly intact   Laboratory: Recent Labs  Lab 09/17/18 1941 09/18/18 0207  WBC 7.0 7.7  HGB 12.1* 12.7*  HCT 36.0* 37.5*  PLT 210 224   Recent Labs  Lab 09/17/18 1941 09/18/18 0207  NA 138 139  K 3.6 3.5  CL 107  104  CO2 25 25  BUN 13 13  CREATININE 1.09 1.10  CALCIUM 8.7* 8.9  PROT 6.6  --   BILITOT 0.7  --   ALKPHOS 68  --   ALT 78*  --   AST 48*  --   GLUCOSE 128* 103*    Imaging/Diagnostic Tests: Dg Chest Portable 1 View  Result Date: 09/17/2018 CLINICAL DATA:  Swelling in feet. EXAM: PORTABLE CHEST 1 VIEW COMPARISON:  Aug 21, 2018 FINDINGS: The cardiac size is significantly enlarged. There is vascular congestion without overt pulmonary edema. Evaluation is limited by suboptimal technique and patient body habitus. There is no pneumothorax. No acute osseous abnormality. IMPRESSION: Cardiomegaly with vascular congestion. Electronically Signed   By: Constance Holster M.D.   On: 09/17/2018 20:18     Matilde Haymaker, MD 09/18/2018, 6:39 AM PGY-1, [place] Intern pager: [phone], text pages welcome

## 2018-09-18 NOTE — Progress Notes (Addendum)
Family Medicine Teaching Service Daily Progress Note Intern Pager: [phone]  Patient name: Caroline Matters Hollander Medical record number: [id number] Date of birth: 1990-11-07 Age: 28 y.o. Gender: male  Primary Care Provider: Patient, No Pcp Per Consultants: none Code Status: FULL  Pt Overview and Major Events to Date:  6/18-admission for new onset CHF  Assessment and Plan: Farrell Marquel Charity is a 28 y.o. male presenting with worsening BLE edema and orthopnea. PMH is significant for morbid obesity and asthma.  New onset HFrEF Echo with EF 15-20%, severe mitral valve regurg.  Cardiology consulted and noted RV fxn also not normal.  Started on Lasix 40mg  IV BID, UOP since admission 3.4L, weight pending this AM.  Cr pending this AM.  Exam this AM with 1+ pitting BLE edema, breathing comfortably on RA. Tachycardia also improved with diuresis, HR 92 this AM.   Planning for [facility] on 6/22. - cardiology following, appreciate recs - IV Lasix 40mg  BID per cards - L/RHC on 6/22  Elevated troponin  Trops 0.03x3.  Remains without chest pain. -Cardiac monitoring  Elevated AST/ALT AST 48, ALT 78.  Likely 2/2 congestive hepatopathy. - acute hepatitis panel, HIV - diuresis per above  Nutrition He reported that he recognizes his weight is contributing to his health problems currently and would like to talk to someone about appropriate nutrition and weight loss. -Consult to nutrition  Normocytic Anemia Hgb pending this AM, anemia panel WNL.  Suspect 2/2 volume overload. - FOBT - monitor CBC  Asthma -Albuterol PRN  Social Needs - SW consult for PCP, did offer patient follow up at [facility] as well  FEN/GI: Regular Prophylaxis: Lovenox   Disposition: [facility] 6/22  Subjective:  Patient reports this morning that he is feeling well.  States that he believes his orthopnea has mildly improved.  Notes that he has noticed improvement in the swelling of his legs.  Has no complaints at this  time.  Objective: Temp:  [98.2 F (36.8 C)-98.8 F (37.1 C)] 98.8 F (37.1 C) (06/19 2300) Pulse Rate:  [92-108] 92 (06/19 2300) Resp:  [16-17] 17 (06/19 2300) BP: (130-155)/(86-99) 130/93 (06/19 2300) SpO2:  [99 %-100 %] 99 % (06/19 2300)  Physical Exam:  General: 28 y.o. male in NAD Cardio: RRR no m/r/g, distant heart sounds Lungs: Mild bibasilar crackles, no IWOB on RA Abdomen: Soft, non-tender to palpation, non-distended, positive bowel sounds Skin: warm and dry Extremities: 1+ bilateral lower extremity pitting edema   Laboratory: Recent Labs  Lab 09/17/18 1941 09/18/18 0207  WBC 7.0 7.7  HGB 12.1* 12.7*  HCT 36.0* 37.5*  PLT 210 224   Recent Labs  Lab 09/17/18 1941 09/18/18 0207  NA 138 139  K 3.6 3.5  CL 107 104  CO2 25 25  BUN 13 13  CREATININE 1.09 1.10  CALCIUM 8.7* 8.9  PROT 6.6  --   BILITOT 0.7  --   ALKPHOS 68  --   ALT 78*  --   AST 48*  --   GLUCOSE 128* 103*    Imaging/Diagnostic Tests: No results found.   Kathelene Rumberger, Bernita Raisin, DO 09/19/2018, 7:36 AM PGY-1, [place] Intern pager: [phone], text pages welcome

## 2018-09-18 NOTE — TOC Initial Note (Signed)
Transition of Care [facility]) - Initial/Assessment Note    Patient Details  Name: Todd Griffith MRN: [id number] Date of Birth: 08/06/1990  [facility] CM/SW Contact:    Zenon Mayo, RN Phone Number: 09/18/2018, 3:56 PM  Clinical Narrative:                 From home with his girlfriend, pta indep.  NCM offered choice for [facility] for CHF , he states he does not want [facility] services. NCM left the agency list with patient in case he changes his mind.   Expected Discharge Plan: [place] Barriers to Discharge: No Barriers Identified   Patient Goals and CMS Choice Patient states their goals for this hospitalization and ongoing recovery are:: get better CMS Medicare.gov Compare Post Acute Care list provided to:: Patient Choice offered to / list presented to : Patient  Expected Discharge Plan and Services Expected Discharge Plan: [place] Referral: NA Discharge Planning Services: CM Consult Post Acute Care Choice: (Does not want HH services) Living arrangements for the past 2 months: Apartment                 DME Arranged: (NA)         HH Arranged: Refused HH          Prior Living Arrangements/Services Living arrangements for the past 2 months: Apartment Lives with:: Significant Other Patient language and need for interpreter reviewed:: Yes Do you feel safe going back to the place where you live?: Yes      Need for Family Participation in Patient Care: No (Comment) Care giver support system in place?: No (comment)   Criminal Activity/Legal Involvement Pertinent to Current Situation/Hospitalization: No - Comment as needed  Activities of Daily Living Home Assistive Devices/Equipment: None ADL Screening (condition at time of admission) Patient's cognitive ability adequate to safely complete daily activities?: Yes Is the patient deaf or have difficulty hearing?: No Does the patient have difficulty seeing,  even when wearing glasses/contacts?: No Does the patient have difficulty concentrating, remembering, or making decisions?: No Patient able to express need for assistance with ADLs?: Yes Does the patient have difficulty dressing or bathing?: No Independently performs ADLs?: Yes (appropriate for developmental age) Does the patient have difficulty walking or climbing stairs?: Yes Weakness of Legs: None Weakness of Arms/Hands: None  Permission Sought/Granted                  Emotional Assessment Appearance:: Appears stated age Attitude/Demeanor/Rapport: Gracious Affect (typically observed): Appropriate Orientation: : Oriented to Place, Oriented to Self, Oriented to  Time, Oriented to Situation   Psych Involvement: No (comment)  Admission diagnosis:  Peripheral edema [R60.9] Patient Active Problem List   Diagnosis Date Noted  . Volume overload 09/17/2018  . Peripheral edema   . Orthopnea   . Cardiomegaly   . Uncomplicated asthma    PCP:  Patient, No Pcp Per Pharmacy:   [id number] - [place], [place] [address] [place] [address] Phone: [phone] Fax: [phone]     Social Determinants of Health (SDOH) Interventions    Readmission Risk Interventions No flowsheet data found.

## 2018-09-18 NOTE — Progress Notes (Signed)
  Echocardiogram 2D Echocardiogram has been performed.  Todd Griffith 09/18/2018, 10:54 AM

## 2018-09-18 NOTE — Consult Note (Addendum)
Cardiology Consultation:   Patient ID: Todd Griffith; [id number]; 1990-07-24   Admit date: 09/17/2018 Date of Consult: 09/18/2018  Primary Care Provider: Patient, No Pcp Per Primary Cardiologist: New to [facility] Primary Electrophysiologist:  None    Patient Profile:   Todd Griffith is a 28 y.o. male with a PMH of morbid obesity and asthma who is being seen today for the evaluation of  CHF at the request of Dr. Manson Passey.  History of Present Illness:    [name] is a 27yo with no prior cardiac history who presented to [facility] hosp with LE edema and DOE worsening over the past week. He reported about 2 months ago he began noticing orthopnea, for which he has been sleeping on 3-4 pillows. He reported intermittent chest "achiness" with exertion and DOE. He recalls one episode of PND in February but none since then.  The pt was seen in [facility] ED in March Had fever at time   "asthma" flare.  He was seen again in May with complaints of cheset tightness   Sent out He now waited until after his daughter was born to come in   He notes continued SOB with exertion   Chest tightness that will take several min to ease.   Denies fevers  No cough   He states he snores at night and frequently experiences daytime somnolence. The pt states he has had significant weight gain of ~150lbs over the past couple years, and was surprised to hear his weight was 398lbs on admission, as his weight was 375lbs 2 weeks ago.  He denies prior cardiac history and has never seen a cardiologist.Denies vilral infection though in March he did have a fever on ED visit. He is morbidly obese and describes symptoms c/f sleep apnea, though has never undergone a sleep study. He reports his father died from an MI at age 72 and his mother had HTN and DM. No other family history of premature cardiac death or CHF.  The pt has a 10 year hx of tobacco  Quit   Drinks about 1/5 of liquor on weekends He reports eating out  at restaurants most meals of the week. He also states he drinks 1 gallon of water per day.   Hospital course: Tachycardic and hypertensive,  BNP 158. EKG with sinus tachycardia/NSR, with isolated TW abnormality in aVL, no STE/D, QTC 489. CXR with cardiomegaly and vascular congestion. Echo with severely reduced LV function with EF 15-20%, severely dilated LV, severe hypokinesis of the LV, moderate to severe MR, with normal RV systolic size/function.  The patient was  was started on IV lasix 20mg  daily with UOP net - this admission. Cardiology asked to evaluate for CHF.   Past Medical History:  Diagnosis Date  . Asthma   . Bronchitis     No past surgical history on file.   Home Medications:  Prior to Admission medications   Medication Sig Start Date End Date Taking? Authorizing Provider  albuterol (PROVENTIL HFA;VENTOLIN HFA) 108 (90 Base) MCG/ACT inhaler Inhale 1-2 puffs into the lungs every 6 (six) hours as needed for wheezing or shortness of breath.   Yes [provider]    Inpatient Medications: Scheduled Meds: . enoxaparin (LOVENOX) injection  0.5 mg/kg Subcutaneous QHS  . furosemide  20 mg Intravenous Daily  . sodium chloride flush  3 mL Intravenous Q12H   Continuous Infusions: . sodium chloride     PRN Meds: sodium chloride, acetaminophen, albuterol, sodium chloride flush  Allergies:   No Known Allergies  Social History:   Social History   Socioeconomic History  . Marital status: Significant Other    Spouse name: Not on file  . Number of children: 1  . Years of education: 5216  . Highest education level: Bachelor's degree (e.g., BA, AB, BS)  Occupational History  . Occupation: recovery specialist    Employer: [employer]  Social Needs  . Financial resource strain: Not hard at all  . Food insecurity    Worry: Never true    Inability: Never true  . Transportation needs    Medical: Not on file    Non-medical: Not on file  Tobacco Use  . Smoking  status: Current Some Day Smoker    Types: Cigarettes  . Smokeless tobacco: Never Used  Substance and Sexual Activity  . Alcohol use: Yes    Alcohol/week: 3.0 standard drinks    Types: 3 Cans of beer per week    Frequency: Never    Comment: socially   . Drug use: Not Currently  . Sexual activity: Yes    Partners: Female  Lifestyle  . Physical activity    Days per week: 1 day    Minutes per session: 30 min  . Stress: Not at all  Relationships  . Social Musicianconnections    Talks on phone: Once a week    Gets together: Once a week    Attends religious service: Never    Active member of club or organization: Yes    Attends meetings of clubs or organizations: 1 to 4 times per year    Relationship status: Never married  . Intimate partner violence    Fear of current or ex partner: No    Emotionally abused: No    Physically abused: Not on file    Forced sexual activity: No  Other Topics Concern  . Not on file  Social History Narrative  . Not on file    Family History:    Family History  Problem Relation Age of Onset  . Hypertension Mother   . Diabetes Mellitus II Father   . CAD Father        Died at 5843 of 'CAD' while sleeping   . Obesity Sister      ROS:  Please see the history of present illness.   All other ROS reviewed and negative.     Physical Exam/Data:   Vitals:   09/17/18 2210 09/17/18 2238 09/18/18 0700 09/18/18 0858  BP: (!) 145/94 (!) 154/108  137/86  Pulse: (!) 103 (!) 101  (!) 108  Resp: 18 16  16   Temp:  98.6 F (37 C)  98.4 F (36.9 C)  TempSrc:  Oral  Oral  SpO2: 100% 100%  100%  Weight:  (!) 181.6 kg (!) 181.8 kg   Height:  5\' 10"  (1.778 m)      Intake/Output Summary (Last 24 hours) at 09/18/2018 1001 Last data filed at 09/18/2018 0700 Gross per 24 hour  Intake 1240 ml  Output 2095 ml  Net -855 ml   Filed Weights   09/17/18 2238 09/18/18 0700  Weight: (!) 181.6 kg (!) 181.8 kg   Body mass index is 57.51 kg/m.  Physical Exam per MD  below  Morbidly obese 28 yo in NAD HEENT  NCAT Neck:  Full   Prominent fat pad on back on neck  JVP difficult to discern   No bruits Lungs are relatively clear  No wheezes Cardiac exam  RRR  No S3   No signif murmurs Abdomen   OBese   Nontender  Normal BS Ext with 1+ LE edema   2+ PT pulses Neuro  A and O x 3   CN II to XII intact Psych  Normal affect  EKG:  The EKG was personally reviewed and demonstrates:  sinus tachycardia/NSR, with isolated TW abnormality in aVL, no STE/D, QTC 489   Relevant CV Studies: Echocardiogram 09/18/2018: IMPRESSIONS    1. Severe hypokinesis of the left ventricular, entire anterior wall and anteroseptal wall.  2. The left ventricle has a visually estimated ejection fraction of 15-20%. The cavity size was severely dilated. wall thickness is thin for patient age/size. Left ventricular diastolic Doppler parameters are indeterminate. Elevated left ventricular  end-diastolic pressure The E/e' is 40.916.3. Left ventricular diffuse hypokinesis.  3. The right ventricle has normal systolic function. The cavity was normal. There is no increase in right ventricular wall thickness. Right ventricular systolic pressure could not be assessed.  4. Small pericardial effusion.  5. The mitral valve is abnormal. Mitral valve regurgitation is moderate to severe by color flow Doppler. No evidence of mitral valve stenosis.  6. MV leaflets appear to have a small, central area of incomplete coaptation, likely due to annular dilation from LV dysfunction/chamber dilation.  7. The tricuspid valve is grossly normal.  8. The aortic valve is tricuspid. No stenosis of the aortic valve.  9. The pulmonic valve was grossly normal. Pulmonic valve regurgitation is moderate is mild by color flow Doppler. 10. The inferior vena cava was normal in size with <50% respiratory variability. 11. The interatrial septum was not well visualized.  SUMMARY   Sevrely reduced LVEF with dilated chamber size  and walls thinner than expected for age/size. Does appear to have somehwat worse anterior/anterioseptal hypokinesis that other walls. Definity contrast excludes apical thrombus. Diastolic dysfunction indeterminate given LS size and lack of TR jet, but tissue velocities and E/E' suggest relaxation is abnormal and filling pressures are high. Mitral valve has a small central area that appears to incompletely coapt; jet not fully captured, but appears moderate and cannot exclude severe MR, Carpentier class IIIb.  Laboratory Data:  Chemistry Recent Labs  Lab 09/17/18 1941 09/18/18 0207  NA 138 139  K 3.6 3.5  CL 107 104  CO2 25 25  GLUCOSE 128* 103*  BUN 13 13  CREATININE 1.09 1.10  CALCIUM 8.7* 8.9  GFRNONAA >60 >60  GFRAA >60 >60  ANIONGAP 6 10    Recent Labs  Lab 09/17/18 1941  PROT 6.6  ALBUMIN 3.3*  AST 48*  ALT 78*  ALKPHOS 68  BILITOT 0.7   Hematology Recent Labs  Lab 09/17/18 1941 09/18/18 0207  WBC 7.0 7.7  RBC 3.90* 4.13*  4.13*  HGB 12.1* 12.7*  HCT 36.0* 37.5*  MCV 92.3 90.8  MCH 31.0 30.8  MCHC 33.6 33.9  RDW 12.6 12.7  PLT 210 224   Cardiac Enzymes Recent Labs  Lab 09/17/18 1941 09/18/18 0207  TROPONINI 0.03* 0.03*   No results for input(s): TROPIPOC in the last 168 hours.  BNP Recent Labs  Lab 09/17/18 1926  BNP 158.0*    DDimer No results for input(s): DDIMER in the last 168 hours.  Radiology/Studies:  Dg Chest Portable 1 View  Result Date: 09/17/2018 CLINICAL DATA:  Swelling in feet. EXAM: PORTABLE CHEST 1 VIEW COMPARISON:  Aug 21, 2018 FINDINGS: The cardiac size is significantly enlarged. There is vascular congestion without overt pulmonary edema.  Evaluation is limited by suboptimal technique and patient body habitus. There is no pneumothorax. No acute osseous abnormality. IMPRESSION: Cardiomegaly with vascular congestion. Electronically Signed   By: Constance Holster M.D.   On: 09/17/2018 20:18    Assessment and Plan:   PT is a 28  yo presented to ED with progressive SOB and chest pressure   WOrk up shows cardiomegaly and echo with LV systolic dysfunction   1. Acute combined CHF:  Severe.  Etiology unknown   FHx of dad with reported CAD dying suddenly  Otherwsie no FHx  for CHF  ? ETOH   Drinks on weekends  ? Viral (fever in march in ED)  Doubt due to hypertension   No evid of other severe organ dysfunciotn    ? CAD   Hx of tobacco use and some regional wall motion differences   But no Q waves on EKG   Exam difficult given pt's  size but weight is upsignificantly, even in the past few wks  and he appears volume overloaded   CXR with signif cardiomegatly   Echo very difficult   LV is severely enlarged  LVEF diffclut to assess but appears severely down Not thogh that some walls move better than others   On my review RV function is not normal either  I have reviewed all the above with pt    WOuld recomm 1  Continue diuresis 2  WOuld recomm R and L heart cath to define anatomy (particularly with regional wall motion differences   ALso assess filling pressure   Interesting on echo today IVC is normal 3 - Will need more aggressive diuresis  - Anticipate starting BBlocker and Entresto prior to discharge - Will provide Living with HF book - Continue to encourage low sodium diet and fluid restrict to <1.5L per day - Continue to monitor strict I&Os and daily weights  2. HTN: Not previously diagnosed, however BP has been elevated on several ED visits this year. BP poorly controlled this admission.  - Will add low dose coreg and then Entresto   Follow renal funciton   - Continue IV lasix for now  3. Severe MR: likely functional in the setting of dilated cardiomyopathy and volume overload - Continue diuresis as above   4. ETOH abuse: reports drinking a fifth of liquor on the weekends. We discussed health risks associated with ongoing abuse.  - Continue to encourage cessation  5. Suspected OSA: patient reports PND, snoring, and  daytime somnolence - Would benefit from sleep study after discharge  6. Morbid Obesity: he reports significant weight gain over the past several years. Poor eating habits likely contributed, as well as lack of physical activity.  - Continue to encourage healthy dietary and lifestyle modifications to promote weight loss - Nutrition consulted this admission   For questions or updates, please contact [place] Please consult [email or web address] for contact info under Cardiology/STEMI.   Signed, Abigail Butts, PA-C  09/18/2018 10:01 AM [phone]  Pt seen and examined  I agree with findings of K Kroeger above   I have added my physical exam to not above and made amendments to note to reflect my findings. Plan to diurese and then perform R/L heart cath on Monday     I have contacted pt's mother (he is an only child) to review findings ([phone])  Dorris Carnes MD

## 2018-09-18 NOTE — Plan of Care (Signed)
Nutrition Education Note  RD consulted for nutrition education regarding new onset CHF.  RD provided "Low Sodium Nutrition Therapy" handout from the Academy of Nutrition and Dietetics. Reviewed patient's dietary recall. Provided examples on ways to decrease sodium intake in diet. Discouraged intake of processed foods and use of salt shaker. Encouraged fresh fruits and vegetables as well as whole grain sources of carbohydrates to maximize fiber intake.   RD discussed why it is important for patient to adhere to diet recommendations, and emphasized the role of fluids, foods to avoid, and importance of weighing self daily. Teach back method used.  Pt requesting weight loss education. Spoke with him at bedside. Reports eating 2 meals daily that consist of take out ([place], [address], [employer], [place]) for lunch and chicken/grains or pasta for dinner. States he doesn't eat frequently throughout the day, but knows his meal portion sizes exceed his requirement. Discussed the importance of consistency when eating, how to make meals balanced, and how to substitute for healthier drinks. Talked about sodium limitations,  which foods contain sodium, and how to read a food label. Pt reports MD limited fluid to 1L daily. Pt would likely benefit from outpatient diet education and is amenable to going.   Expect fair compliance.  Body mass index is 57.51 kg/m. Pt meets criteria for morbid obesity based on current BMI.  Labs and medications reviewed. No further nutrition interventions warranted at this time. RD contact information provided. If additional nutrition issues arise, please re-consult RD.   Mariana Single RD, LDN Clinical Nutrition Pager # [phone]

## 2018-09-19 DIAGNOSIS — I5041 Acute combined systolic (congestive) and diastolic (congestive) heart failure: Secondary | ICD-10-CM | POA: Diagnosis present

## 2018-09-19 DIAGNOSIS — I5021 Acute systolic (congestive) heart failure: Secondary | ICD-10-CM

## 2018-09-19 LAB — BASIC METABOLIC PANEL
Anion gap: 8 (ref 5–15)
BUN: 15 mg/dL (ref 6–20)
CO2: 25 mmol/L (ref 22–32)
Calcium: 9.1 mg/dL (ref 8.9–10.3)
Chloride: 103 mmol/L (ref 98–111)
Creatinine, Ser: 1.14 mg/dL (ref 0.61–1.24)
GFR calc Af Amer: >60 mL/min (ref >60)
GFR calc non Af Amer: >60 mL/min (ref >60)
Glucose, Bld: 89 mg/dL (ref 70–99)
Potassium: 3.8 mmol/L (ref 3.5–5.1)
Sodium: 136 mmol/L (ref 135–145)

## 2018-09-19 LAB — CBC
HCT: 38.8 % — ABNORMAL LOW (ref 39.0–52.0)
Hemoglobin: 13.1 g/dL (ref 13.0–17.0)
MCH: 30.6 pg (ref 26.0–34.0)
MCHC: 33.8 g/dL (ref 30.0–36.0)
MCV: 90.7 fL (ref 80.0–100.0)
Platelets: 225 10*3/uL (ref 150–400)
RBC: 4.28 MIL/uL (ref 4.22–5.81)
RDW: 12.7 % (ref 11.5–15.5)
WBC: 7.3 10*3/uL (ref 4.0–10.5)
nRBC: 0 % (ref 0.0–0.2)

## 2018-09-19 MED ORDER — SACUBITRIL-VALSARTAN 24-26 MG PO TABS
1.0000 | ORAL_TABLET | Freq: Two times a day (BID) | ORAL | Status: DC
  Administered 2018-09-19 – 2018-09-22 (×5): 1 via ORAL
  Filled 2018-09-19 (×7): qty 1

## 2018-09-19 NOTE — Progress Notes (Signed)
Progress Note  Patient Name: Todd Griffith Date of Encounter: 09/19/2018  Primary Cardiologist:   No primary care provider on file.   Subjective   Breathing OK.  Not back to baseline.   Inpatient Medications    Scheduled Meds: . carvedilol  3.125 mg Oral BID WC  . enoxaparin (LOVENOX) injection  0.5 mg/kg Subcutaneous QHS  . furosemide  40 mg Intravenous BID  . Living Better with Heart Failure Book   Does not apply Once  . sodium chloride flush  3 mL Intravenous Q12H   Continuous Infusions: . sodium chloride     PRN Meds: sodium chloride, acetaminophen, albuterol, sodium chloride flush   Vital Signs    Vitals:   09/18/18 1700 09/18/18 2300 09/19/18 0500 09/19/18 0823  BP: (!) 155/99 (!) 130/93  (!) 145/95  Pulse: 98 92  94  Resp: 16 17    Temp: 98.2 F (36.8 C) 98.8 F (37.1 C)  98 F (36.7 C)  TempSrc: Oral Oral  Oral  SpO2: 100% 99%  100%  Weight:   (!) 179.2 kg   Height:        Intake/Output Summary (Last 24 hours) at 09/19/2018 1648 Last data filed at 09/19/2018 1500 Gross per 24 hour  Intake 240 ml  Output 3075 ml  Net -2835 ml   Filed Weights   09/17/18 2238 09/18/18 0700 09/19/18 0500  Weight: (!) 181.6 kg (!) 181.8 kg (!) 179.2 kg    Telemetry    NSR - Personally Reviewed  ECG    NA - Personally Reviewed  Physical Exam   GEN: No acute distress.   Neck: No  JVD Cardiac: RRR, no murmurs, rubs, or gallops.  Respiratory: Clear to auscultation bilaterally. GI: Soft, nontender, non-distended  MS:   Mild edema; No deformity. Neuro:  Nonfocal  Psych: Normal affect   Labs    Chemistry Recent Labs  Lab 09/17/18 1941 09/18/18 0207 09/19/18 0809  NA 138 139 136  K 3.6 3.5 3.8  CL 107 104 103  CO2 25 25 25   GLUCOSE 128* 103* 89  BUN 13 13 15   CREATININE 1.09 1.10 1.14  CALCIUM 8.7* 8.9 9.1  PROT 6.6  --   --   ALBUMIN 3.3*  --   --   AST 48*  --   --   ALT 78*  --   --   ALKPHOS 68  --   --   BILITOT 0.7  --   --    GFRNONAA >60 >60 >60  GFRAA >60 >60 >60  ANIONGAP 6 10 8      Hematology Recent Labs  Lab 09/17/18 1941 09/18/18 0207 09/19/18 0809  WBC 7.0 7.7 7.3  RBC 3.90* 4.13*  4.13* 4.28  HGB 12.1* 12.7* 13.1  HCT 36.0* 37.5* 38.8*  MCV 92.3 90.8 90.7  MCH 31.0 30.8 30.6  MCHC 33.6 33.9 33.8  RDW 12.6 12.7 12.7  PLT 210 224 225    Cardiac Enzymes Recent Labs  Lab 09/17/18 1941 09/18/18 0207 09/18/18 0853  TROPONINI 0.03* 0.03* 0.03*   No results for input(s): TROPIPOC in the last 168 hours.   BNP Recent Labs  Lab 09/17/18 1926  BNP 158.0*     DDimer No results for input(s): DDIMER in the last 168 hours.   Radiology    Dg Chest Portable 1 View  Result Date: 09/17/2018 CLINICAL DATA:  Swelling in feet. EXAM: PORTABLE CHEST 1 VIEW COMPARISON:  Aug 21, 2018 FINDINGS: The cardiac  size is significantly enlarged. There is vascular congestion without overt pulmonary edema. Evaluation is limited by suboptimal technique and patient body habitus. There is no pneumothorax. No acute osseous abnormality. IMPRESSION: Cardiomegaly with vascular congestion. Electronically Signed   By: Constance Holster M.D.   On: 09/17/2018 20:18    Cardiac Studies   Echo:   09/18/18   1. Severe hypokinesis of the left ventricular, entire anterior wall and anteroseptal wall.  2. The left ventricle has a visually estimated ejection fraction of 15-20%. The cavity size was severely dilated. wall thickness is thin for patient age/size. Left ventricular diastolic Doppler parameters are indeterminate. Elevated left ventricular  end-diastolic pressure The E/e' is 16.3. Left ventricular diffuse hypokinesis.  3. The right ventricle has normal systolic function. The cavity was normal. There is no increase in right ventricular wall thickness. Right ventricular systolic pressure could not be assessed.  4. Small pericardial effusion.  5. The mitral valve is abnormal. Mitral valve regurgitation is moderate to severe  by color flow Doppler. No evidence of mitral valve stenosis.  6. MV leaflets appear to have a small, central area of incomplete coaptation, likely due to annular dilation from LV dysfunction/chamber dilation.  7. The tricuspid valve is grossly normal.  8. The aortic valve is tricuspid. No stenosis of the aortic valve.  9. The pulmonic valve was grossly normal. Pulmonic valve regurgitation is moderate is mild by color flow Doppler. 10. The inferior vena cava was normal in size with <50% respiratory variability. 11. The interatrial septum was not well visualized.  Patient Profile     28 y.o. male with a PMH of morbid obesity and asthma who is being seen for the evaluation of  CHF at the request of Dr. Owens Shark.   Assessment & Plan    ACUTE SYSTOLIC HF: Plan is for right and left heart cath on Monday.  Net negative 3.7 liters this admission.   Long discussion about salt/diet/meds/ETOH.    HTN:  This is being managed in the context of treating his CHF   MR:   Will follow as his heart hopefully remodels over time this should improve.    For questions or updates, please contact [place] Please consult [email or web address] for contact info under Cardiology/STEMI.   Signed, Minus Breeding, MD  09/19/2018, 4:48 PM

## 2018-09-20 LAB — CBC
HCT: 38.8 % — ABNORMAL LOW (ref 39.0–52.0)
Hemoglobin: 13 g/dL (ref 13.0–17.0)
MCH: 30.4 pg (ref 26.0–34.0)
MCHC: 33.5 g/dL (ref 30.0–36.0)
MCV: 90.9 fL (ref 80.0–100.0)
Platelets: 245 10*3/uL (ref 150–400)
RBC: 4.27 MIL/uL (ref 4.22–5.81)
RDW: 12.7 % (ref 11.5–15.5)
WBC: 6.9 10*3/uL (ref 4.0–10.5)
nRBC: 0 % (ref 0.0–0.2)

## 2018-09-20 LAB — BASIC METABOLIC PANEL
Anion gap: 8 (ref 5–15)
BUN: 18 mg/dL (ref 6–20)
CO2: 27 mmol/L (ref 22–32)
Calcium: 9.1 mg/dL (ref 8.9–10.3)
Chloride: 101 mmol/L (ref 98–111)
Creatinine, Ser: 1.33 mg/dL — ABNORMAL HIGH (ref 0.61–1.24)
GFR calc Af Amer: >60 mL/min (ref >60)
GFR calc non Af Amer: >60 mL/min (ref >60)
Glucose, Bld: 100 mg/dL — ABNORMAL HIGH (ref 70–99)
Potassium: 3.5 mmol/L (ref 3.5–5.1)
Sodium: 136 mmol/L (ref 135–145)

## 2018-09-20 MED ORDER — FUROSEMIDE 10 MG/ML IJ SOLN
40.0000 mg | Freq: Two times a day (BID) | INTRAMUSCULAR | Status: AC
  Administered 2018-09-20: 40 mg via INTRAVENOUS
  Filled 2018-09-20: qty 4

## 2018-09-20 NOTE — Progress Notes (Signed)
Progress Note  Patient Name: Todd Griffith Date of Encounter: 09/20/2018  Primary Cardiologist:   No primary care provider on file.   Subjective   Breathing is better.  Maybe close to baseline  Inpatient Medications    Scheduled Meds: . carvedilol  3.125 mg Oral BID WC  . enoxaparin (LOVENOX) injection  0.5 mg/kg Subcutaneous QHS  . furosemide  40 mg Intravenous BID  . Living Better with Heart Failure Book   Does not apply Once  . sacubitril-valsartan  1 tablet Oral BID  . sodium chloride flush  3 mL Intravenous Q12H   Continuous Infusions: . sodium chloride     PRN Meds: sodium chloride, acetaminophen, albuterol, sodium chloride flush   Vital Signs    Vitals:   09/19/18 2036 09/19/18 2317 09/20/18 0616 09/20/18 0830  BP: 128/81 (!) 137/103 116/77 (!) 154/104  Pulse: 95 95  89  Resp: 19 20  16   Temp: 97.8 F (36.6 C) 98.4 F (36.9 C)  97.7 F (36.5 C)  TempSrc: Oral Oral  Oral  SpO2: 98% 97%  97%  Weight:   (!) 182.2 kg   Height:        Intake/Output Summary (Last 24 hours) at 09/20/2018 1103 Last data filed at 09/20/2018 0500 Gross per 24 hour  Intake 0 ml  Output 2400 ml  Net -2400 ml   Filed Weights   09/18/18 0700 09/19/18 0500 09/20/18 0616  Weight: (!) 181.8 kg (!) 179.2 kg (!) 182.2 kg    Telemetry    NSR - Personally Reviewed  ECG    NA - Personally Reviewed  Physical Exam   GEN: No  acute distress.   Neck: No  JVD Cardiac: RRR, no murmurs, rubs, or gallops.  Respiratory: Clear   to auscultation bilaterally. GI: Soft, nontender, non-distended, normal bowel sounds  MS:  Mild/mod edema; No deformity. Neuro:   Nonfocal  Psych: Oriented and appropriate   Labs    Chemistry Recent Labs  Lab 09/17/18 1941 09/18/18 0207 09/19/18 0809 09/20/18 0757  NA 138 139 136 136  K 3.6 3.5 3.8 3.5  CL 107 104 103 101  CO2 25 25 25 27   GLUCOSE 128* 103* 89 100*  BUN 13 13 15 18   CREATININE 1.09 1.10 1.14 1.33*  CALCIUM 8.7* 8.9  9.1 9.1  PROT 6.6  --   --   --   ALBUMIN 3.3*  --   --   --   AST 48*  --   --   --   ALT 78*  --   --   --   ALKPHOS 68  --   --   --   BILITOT 0.7  --   --   --   GFRNONAA >60 >60 >60 >60  GFRAA >60 >60 >60 >60  ANIONGAP 6 10 8 8      Hematology Recent Labs  Lab 09/18/18 0207 09/19/18 0809 09/20/18 0757  WBC 7.7 7.3 6.9  RBC 4.13*  4.13* 4.28 4.27  HGB 12.7* 13.1 13.0  HCT 37.5* 38.8* 38.8*  MCV 90.8 90.7 90.9  MCH 30.8 30.6 30.4  MCHC 33.9 33.8 33.5  RDW 12.7 12.7 12.7  PLT 224 225 245    Cardiac Enzymes Recent Labs  Lab 09/17/18 1941 09/18/18 0207 09/18/18 0853  TROPONINI 0.03* 0.03* 0.03*   No results for input(s): TROPIPOC in the last 168 hours.   BNP Recent Labs  Lab 09/17/18 1926  BNP 158.0*  DDimer No results for input(s): DDIMER in the last 168 hours.   Radiology    No results found.  Cardiac Studies   Echo:   09/18/18   1. Severe hypokinesis of the left ventricular, entire anterior wall and anteroseptal wall.  2. The left ventricle has a visually estimated ejection fraction of 15-20%. The cavity size was severely dilated. wall thickness is thin for patient age/size. Left ventricular diastolic Doppler parameters are indeterminate. Elevated left ventricular  end-diastolic pressure The E/e' is 16.3. Left ventricular diffuse hypokinesis.  3. The right ventricle has normal systolic function. The cavity was normal. There is no increase in right ventricular wall thickness. Right ventricular systolic pressure could not be assessed.  4. Small pericardial effusion.  5. The mitral valve is abnormal. Mitral valve regurgitation is moderate to severe by color flow Doppler. No evidence of mitral valve stenosis.  6. MV leaflets appear to have a small, central area of incomplete coaptation, likely due to annular dilation from LV dysfunction/chamber dilation.  7. The tricuspid valve is grossly normal.  8. The aortic valve is tricuspid. No stenosis of the  aortic valve.  9. The pulmonic valve was grossly normal. Pulmonic valve regurgitation is moderate is mild by color flow Doppler. 10. The inferior vena cava was normal in size with <50% respiratory variability. 11. The interatrial septum was not well visualized.  Patient Profile     28 y.o. male with a PMH of morbid obesity and asthma who is being seen for the evaluation of  CHF at the request of Dr. Owens Shark.   Assessment & Plan    ACUTE SYSTOLIC HF: Plan is for right and left heart cath on Monday.  Net negative 5.6 liters.  I started Entresto yesterday.   He should follow up post hospital in the [facility].    ELEVATED CREAT:  I suspect that there is some element of hypoventilation obesity syndrome.  I cannot see his RV well enough on echo to evaluate function.  He will have the cath and pressures recorded tomorrow.  I suspect that he is somewhat RV/preload dependent but he still has significant volume so I will continue diuresis.  Hold diuretic before cath in the AM.    HTN:  This is being managed in the context of treating his CHF  MR:   Follow.     For questions or updates, please contact [place] Please consult [email or web address] for contact info under Cardiology/STEMI.   Signed, Minus Breeding, MD  09/20/2018, 11:03 AM

## 2018-09-20 NOTE — Progress Notes (Signed)
Family Medicine Teaching Service Daily Progress Note Intern Pager: [phone]  Patient name: Elijan Googe Yassin Medical record number: [id number] Date of birth: Jun 24, 1990 Age: 28 y.o. Gender: male  Primary Care Provider: Patient, No Pcp Per Consultants: none Code Status: FULL  Pt Overview and Major Events to Date:  6/18-admission for new onset CHF  Assessment and Plan: Kollen Marquel Mani is a 28 y.o. male presenting with worsening BLE edema and orthopnea. PMH is significant for morbid obesity and asthma.  New onset HFrEF Echo with EF 15-20%, severe mitral valve regurg.  Planning for right and left heart cath on 6/22.  3.6L UOP on 6/21.  5.5 L down from hospital admission.  Weight up 0.6 kg from hospital admission. - cardiology following, appreciate recs - IV Lasix 12m BID per cards - L/RHC on 6/22  Creatinine rise Creatinine on admission 1.09.  Creatinine today 1.33.  Not technically an AKI.  Likely secondary to diuresis.  We will continue to monitor. -Daily BMP  Elevated troponin-resolved Trops 0.03x3.  No chest pain this morning. -No further work-up  Elevated AST/ALT AST 48, ALT 78.    Likely secondary to heart failure - acute hepatitis panel, HIV  Nutrition Nutrition has met with him for education regarding weight loss.  Normocytic Anemia-resolved - monitor CBC  Asthma -Albuterol PRN  Social Needs - SW consult for PCP, [facility]  FEN/GI: Regular Prophylaxis: Lovenox   Disposition: L/RHC 6/22  Subjective:  No acute events overnight.  No new complaints this morning.  He specifically denies shortness of breath, chest pain, palpitations.  Objective: Temp:  [97.7 F (36.5 C)-98.4 F (36.9 C)] 97.7 F (36.5 C) (06/21 0830) Pulse Rate:  [89-95] 89 (06/21 0830) Resp:  [16-20] 16 (06/21 0830) BP: (116-154)/(77-104) 154/104 (06/21 0830) SpO2:  [97 %-100 %] 97 % (06/21 0830) Weight:  [182.2 kg] 182.2 kg (06/21  0616)  Physical Exam:  General: Severely obese young man.  Breathing heavily on exam but he feels that this is his "normal" respiratory status. HEENT: Occult to assess JVD due to neck girth. Cardio: Distant heart sounds.  Regular rate and rhythm the radial pulse Pulm: Difficult to assess due to body habitus.  Good air movement in all fields. Abdomen: Bowel sounds normal. Abdomen soft and non-tender.  Extremities: 1+ pitting edema in lower extremities up to the knee. Neuro: Cranial nerves grossly intact    Laboratory: Recent Labs  Lab 09/18/18 0207 09/19/18 0809 09/20/18 0757  WBC 7.7 7.3 6.9  HGB 12.7* 13.1 13.0  HCT 37.5* 38.8* 38.8*  PLT 224 225 245   Recent Labs  Lab 09/17/18 1941 09/18/18 0207 09/19/18 0809 09/20/18 0757  NA 138 139 136 136  K 3.6 3.5 3.8 3.5  CL 107 104 103 101  CO2 25 25 25 27   BUN 13 13 15 18   CREATININE 1.09 1.10 1.14 1.33*  CALCIUM 8.7* 8.9 9.1 9.1  PROT 6.6  --   --   --   BILITOT 0.7  --   --   --   ALKPHOS 68  --   --   --   ALT 78*  --   --   --   AST 48*  --   --   --   GLUCOSE 128* 103* 89 100*    Imaging/Diagnostic Tests: No results found.   FMatilde Haymaker MD 09/20/2018, 10:17 AM PGY-1, [place] pager: [phone] text pages welcome

## 2018-09-21 ENCOUNTER — Encounter (HOSPITAL_COMMUNITY): Payer: Self-pay | Admitting: Cardiology

## 2018-09-21 ENCOUNTER — Encounter (HOSPITAL_COMMUNITY)
Admission: EM | Disposition: A | Discharge status: Home / Self Care | Payer: Self-pay | Source: Home / Self Care | Attending: Family Medicine

## 2018-09-21 DIAGNOSIS — I420 Dilated cardiomyopathy: Secondary | ICD-10-CM | POA: Diagnosis present

## 2018-09-21 HISTORY — PX: RIGHT/LEFT HEART CATH AND CORONARY ANGIOGRAPHY: CATH118266

## 2018-09-21 LAB — POCT I-STAT 7, (LYTES, BLD GAS, ICA,H+H)
Acid-Base Excess: 2 mmol/L (ref 0.0–2.0)
Bicarbonate: 28.3 mmol/L — ABNORMAL HIGH (ref 20.0–28.0)
Calcium, Ion: 1.24 mmol/L (ref 1.15–1.40)
HCT: 39 % (ref 39.0–52.0)
Hemoglobin: 13.3 g/dL (ref 13.0–17.0)
O2 Saturation: 99 %
Potassium: 3.7 mmol/L (ref 3.5–5.1)
Sodium: 141 mmol/L (ref 135–145)
TCO2: 30 mmol/L (ref 22–32)
pCO2 arterial: 49.7 mmHg — ABNORMAL HIGH (ref 32.0–48.0)
pH, Arterial: 7.364 (ref 7.350–7.450)
pO2, Arterial: 147 mmHg — ABNORMAL HIGH (ref 83.0–108.0)

## 2018-09-21 LAB — HEPATITIS PANEL, ACUTE
HCV Ab: 0.1 s/co ratio (ref 0.0–0.9)
Hep A IgM: NEGATIVE
Hep B C IgM: NEGATIVE
Hepatitis B Surface Ag: NEGATIVE

## 2018-09-21 LAB — POCT I-STAT EG7
Acid-Base Excess: 3 mmol/L — ABNORMAL HIGH (ref 0.0–2.0)
Acid-Base Excess: 3 mmol/L — ABNORMAL HIGH (ref 0.0–2.0)
Bicarbonate: 29.7 mmol/L — ABNORMAL HIGH (ref 20.0–28.0)
Bicarbonate: 29.8 mmol/L — ABNORMAL HIGH (ref 20.0–28.0)
Calcium, Ion: 1.28 mmol/L (ref 1.15–1.40)
Calcium, Ion: 1.28 mmol/L (ref 1.15–1.40)
HCT: 40 % (ref 39.0–52.0)
HCT: 41 % (ref 39.0–52.0)
Hemoglobin: 13.6 g/dL (ref 13.0–17.0)
Hemoglobin: 13.9 g/dL (ref 13.0–17.0)
O2 Saturation: 74 %
O2 Saturation: 74 %
Potassium: 3.8 mmol/L (ref 3.5–5.1)
Potassium: 3.8 mmol/L (ref 3.5–5.1)
Sodium: 141 mmol/L (ref 135–145)
Sodium: 141 mmol/L (ref 135–145)
TCO2: 31 mmol/L (ref 22–32)
TCO2: 31 mmol/L (ref 22–32)
pCO2, Ven: 53.6 mmHg (ref 44.0–60.0)
pCO2, Ven: 54.5 mmHg (ref 44.0–60.0)
pH, Ven: 7.346 (ref 7.250–7.430)
pH, Ven: 7.352 (ref 7.250–7.430)
pO2, Ven: 42 mmHg (ref 32.0–45.0)
pO2, Ven: 43 mmHg (ref 32.0–45.0)

## 2018-09-21 LAB — BASIC METABOLIC PANEL
Anion gap: 6 (ref 5–15)
BUN: 15 mg/dL (ref 6–20)
CO2: 29 mmol/L (ref 22–32)
Calcium: 8.9 mg/dL (ref 8.9–10.3)
Chloride: 103 mmol/L (ref 98–111)
Creatinine, Ser: 1.19 mg/dL (ref 0.61–1.24)
GFR calc Af Amer: >60 mL/min (ref >60)
GFR calc non Af Amer: >60 mL/min (ref >60)
Glucose, Bld: 89 mg/dL (ref 70–99)
Potassium: 3.9 mmol/L (ref 3.5–5.1)
Sodium: 138 mmol/L (ref 135–145)

## 2018-09-21 LAB — HIV 1/2 AB DIFFERENTIATION
HIV 1 Ab: POSITIVE — AB
HIV 2 Ab: NEGATIVE

## 2018-09-21 SURGERY — RIGHT/LEFT HEART CATH AND CORONARY ANGIOGRAPHY
Anesthesia: LOCAL

## 2018-09-21 MED ORDER — LIDOCAINE HCL (PF) 1 % IJ SOLN
INTRAMUSCULAR | Status: AC
  Filled 2018-09-21: qty 30

## 2018-09-21 MED ORDER — HEPARIN (PORCINE) IN NACL 1000-0.9 UT/500ML-% IV SOLN
INTRAVENOUS | Status: DC | PRN
  Administered 2018-09-21 (×2): 500 mL

## 2018-09-21 MED ORDER — VERAPAMIL HCL 2.5 MG/ML IV SOLN
INTRAVENOUS | Status: AC
  Filled 2018-09-21: qty 2

## 2018-09-21 MED ORDER — ASPIRIN 81 MG PO CHEW
81.0000 mg | CHEWABLE_TABLET | Freq: Every day | ORAL | Status: DC
  Administered 2018-09-22: 81 mg via ORAL
  Filled 2018-09-21: qty 1

## 2018-09-21 MED ORDER — FUROSEMIDE 10 MG/ML IJ SOLN
40.0000 mg | Freq: Two times a day (BID) | INTRAMUSCULAR | Status: DC
  Administered 2018-09-21: 40 mg via INTRAVENOUS
  Filled 2018-09-21: qty 4

## 2018-09-21 MED ORDER — ENOXAPARIN SODIUM 40 MG/0.4ML ~~LOC~~ SOLN: 40.0000 mg | SUBCUTANEOUS | Status: DC

## 2018-09-21 MED ORDER — HEPARIN SODIUM (PORCINE) 1000 UNIT/ML IJ SOLN
INTRAMUSCULAR | Status: DC | PRN
  Administered 2018-09-21: 6000 [IU] via INTRAVENOUS

## 2018-09-21 MED ORDER — VERAPAMIL HCL 2.5 MG/ML IV SOLN
INTRAVENOUS | Status: DC | PRN
  Administered 2018-09-21: 10 mL via INTRA_ARTERIAL

## 2018-09-21 MED ORDER — HEPARIN (PORCINE) IN NACL 1000-0.9 UT/500ML-% IV SOLN
INTRAVENOUS | Status: AC
  Filled 2018-09-21: qty 1000

## 2018-09-21 MED ORDER — MIDAZOLAM HCL 2 MG/2ML IJ SOLN
INTRAMUSCULAR | Status: AC
  Filled 2018-09-21: qty 2

## 2018-09-21 MED ORDER — IOHEXOL 350 MG/ML SOLN
INTRAVENOUS | Status: DC | PRN
  Administered 2018-09-21: 95 mL via INTRA_ARTERIAL

## 2018-09-21 MED ORDER — SODIUM CHLORIDE 0.9 % IV SOLN
INTRAVENOUS | Status: DC
  Administered 2018-09-21: 09:00:00 via INTRAVENOUS

## 2018-09-21 MED ORDER — MIDAZOLAM HCL 2 MG/2ML IJ SOLN
INTRAMUSCULAR | Status: DC | PRN
  Administered 2018-09-21: 1 mg via INTRAVENOUS

## 2018-09-21 MED ORDER — CARVEDILOL 6.25 MG PO TABS
6.2500 mg | ORAL_TABLET | Freq: Two times a day (BID) | ORAL | Status: DC
  Administered 2018-09-21 – 2018-09-22 (×2): 6.25 mg via ORAL
  Filled 2018-09-21 (×3): qty 1

## 2018-09-21 MED ORDER — SODIUM CHLORIDE 0.9 % IV SOLN: INTRAVENOUS | Status: AC

## 2018-09-21 MED ORDER — LIDOCAINE HCL (PF) 1 % IJ SOLN
INTRAMUSCULAR | Status: DC | PRN
  Administered 2018-09-21: 5 mL
  Administered 2018-09-21: 3 mL

## 2018-09-21 MED ORDER — FENTANYL CITRATE (PF) 100 MCG/2ML IJ SOLN
INTRAMUSCULAR | Status: DC | PRN
  Administered 2018-09-21: 25 ug via INTRAVENOUS

## 2018-09-21 MED ORDER — ASPIRIN 81 MG PO CHEW
81.0000 mg | CHEWABLE_TABLET | ORAL | Status: AC
  Administered 2018-09-21: 81 mg via ORAL
  Filled 2018-09-21: qty 1

## 2018-09-21 MED ORDER — FENTANYL CITRATE (PF) 100 MCG/2ML IJ SOLN
INTRAMUSCULAR | Status: AC
  Filled 2018-09-21: qty 2

## 2018-09-21 MED ORDER — ENOXAPARIN SODIUM 100 MG/ML ~~LOC~~ SOLN
0.5000 mg/kg | Freq: Every day | SUBCUTANEOUS | Status: DC
  Administered 2018-09-21: 90 mg via SUBCUTANEOUS
  Filled 2018-09-21: qty 1

## 2018-09-21 SURGICAL SUPPLY — 16 items
BAG SNAP BAND KOVER 36X36 (MISCELLANEOUS) ×1 IMPLANT
CATH 5FR JL3.5 JR4 ANG PIG MP (CATHETERS) ×1 IMPLANT
CATH BALLN WEDGE 5F 110CM (CATHETERS) ×1 IMPLANT
CATH OPTITORQUE TIG 4.0 5F (CATHETERS) ×1 IMPLANT
COVER DOME SNAP 22 D (MISCELLANEOUS) ×1 IMPLANT
DEVICE RAD COMP TR BAND LRG (VASCULAR PRODUCTS) ×1 IMPLANT
GLIDESHEATH SLEND SS 6F .021 (SHEATH) ×1 IMPLANT
GUIDEWIRE INQWIRE 1.5J.035X260 (WIRE) IMPLANT
HOVERMATT SINGLE USE (MISCELLANEOUS) ×1 IMPLANT
INQWIRE 1.5J .035X260CM (WIRE) ×2
KIT HEART LEFT (KITS) ×2 IMPLANT
PACK CARDIAC CATHETERIZATION (CUSTOM PROCEDURE TRAY) ×2 IMPLANT
SHEATH GLIDE SLENDER 4/5FR (SHEATH) ×1 IMPLANT
SHEATH PROBE COVER 6X72 (BAG) ×1 IMPLANT
TRANSDUCER W/STOPCOCK (MISCELLANEOUS) ×2 IMPLANT
TUBING CIL FLEX 10 FLL-RA (TUBING) ×2 IMPLANT

## 2018-09-21 NOTE — Progress Notes (Signed)
Family Medicine Teaching Service Daily Progress Note Intern Pager: [phone]  Patient name: Todd Griffith Medical record number: [id number] Date of birth: Dec 28, 1990 Age: 28 y.o. Gender: male  Primary Care Provider: Patient, No Pcp Per Consultants: none Code Status: FULL  Pt Overview and Major Events to Date:  6/18-admission for new onset CHF  Assessment and Plan: Todd Griffith is a 28 y.o. male presenting with worsening BLE edema and orthopnea. PMH is significant for morbid obesity and asthma.  New onset HFrEF EF 15-20%, severe mitral valve regurg. Right and left heart cath today.  1.6 L urine output on 6/21.  7.1 L down from hospital admission.  5.2 kg down from admission.  Physical exam today shows decreased lower extremity edema and improved respiratory effort on room air.  He continues to have mildly labored breathing but this appears to be his baseline.  We will follow-up with cardiology recommendations following heart catheterization today. - cardiology following, appreciate recs - IV Lasix 13m BID per cards - Carvedilol 3.125 mg twice daily - Entresto twice daily - L/RHC on 6/22  Hypertension Blood pressure 122/97 overnight. -Heart failure medications as above  Creatinine rise Creatinine on admission 1.09.  Creatinine today 1.33.  Not technically an AKI.  Likely secondary to diuresis.  We will continue to monitor. -Daily BMP  Elevated troponin-resolved Trops 0.03x3.  No chest pain this morning. -No further work-up  Elevated AST/ALT AST 48, ALT 78.    Likely secondary to heart failure - acute hepatitis panel, HIV  Nutrition Nutrition has met with him for education regarding weight loss.  Normocytic Anemia-resolved - monitor CBC  Asthma -Albuterol PRN  Social Needs - SW consult for PCP, [facility]  FEN/GI: Regular Prophylaxis: Lovenox   Disposition: L/RHC 6/22  Subjective:  No acute events  overnight.  No new complaints this morning.  He reports that he is now breathing back at baseline.  Objective: Temp:  [97.7 F (36.5 C)-98.6 F (37 C)] 97.8 F (36.6 C) (06/21 2341) Pulse Rate:  [86-92] 86 (06/21 2341) Resp:  [16] 16 (06/21 1709) BP: (116-154)/(77-104) 122/97 (06/21 2341) SpO2:  [97 %-100 %] 100 % (06/21 2341) Weight:  [182.2 kg] 182.2 kg (06/21 0616)  Physical Exam:  General: Severely obese young man.  Resting at the edge of his bed in no acute distress.   HEENT: Neck non-tender without lymphadenopathy, masses or thyromegaly Cardio: Normal S1 and S2, no S3 or S4. Rhythm is regular. No murmurs or rubs.   Pulm: Clear to auscultation bilaterally, no crackles, wheezing, or diminished breath sounds. Normal respiratory effort Abdomen: Bowel sounds normal. Abdomen soft and non-tender.  Extremities: No peripheral edema. Warm/ well perfused.  Strong radial pulse. Neuro: Cranial nerves grossly intact  Laboratory: Recent Labs  Lab 09/18/18 0207 09/19/18 0809 09/20/18 0757  WBC 7.7 7.3 6.9  HGB 12.7* 13.1 13.0  HCT 37.5* 38.8* 38.8*  PLT 224 225 245   Recent Labs  Lab 09/17/18 1941 09/18/18 0207 09/19/18 0809 09/20/18 0757  NA 138 139 136 136  K 3.6 3.5 3.8 3.5  CL 107 104 103 101  CO2 25 25 25 27   BUN 13 13 15 18   CREATININE 1.09 1.10 1.14 1.33*  CALCIUM 8.7* 8.9 9.1 9.1  PROT 6.6  --   --   --   BILITOT 0.7  --   --   --   ALKPHOS 68  --   --   --  ALT 78*  --   --   --   AST 48*  --   --   --   GLUCOSE 128* 103* 89 100*    Imaging/Diagnostic Tests: No results found.   Matilde Haymaker, MD 09/21/2018, 5:50 AM PGY-1, [place] Intern pager: [phone], text pages welcome

## 2018-09-21 NOTE — Interval H&P Note (Signed)
History and Physical Interval Note:  09/21/2018 8:58 AM  [place]  has presented today for surgery, with the diagnosis of New Dx Dialted CM with Combined Heart Failure.  The various methods of treatment have been discussed with the patient and family. After consideration of risks, benefits and other options for treatment, the patient has consented to  Procedure(s): RIGHT/LEFT HEART CATH AND CORONARY ANGIOGRAPHY (N/A) as a surgical intervention.  The patient's history has been reviewed, patient examined, no change in status, stable for surgery.  I have reviewed the patient's chart and labs.  Questions were answered to the patient's satisfaction.     Glenetta Hew

## 2018-09-21 NOTE — Progress Notes (Signed)
    TR band was removed at 1245, and sterile gauze was applied at the site. R wrist area is soft and non tender. R radial pulse was palpable.

## 2018-09-21 NOTE — Progress Notes (Signed)
Pt facetime wife while this RN in room and updated

## 2018-09-21 NOTE — Progress Notes (Signed)
Progress Note  Patient Name: Todd Griffith Date of Encounter: 09/21/2018  Primary Cardiologist:   No primary care provider on file.   Subjective   Breathing OK.  No pain.  No SOB.   Inpatient Medications    Scheduled Meds: . carvedilol  3.125 mg Oral BID WC  . Living Better with Heart Failure Book   Does not apply Once  . sacubitril-valsartan  1 tablet Oral BID  . sodium chloride flush  3 mL Intravenous Q12H   Continuous Infusions: . sodium chloride    . sodium chloride     PRN Meds: sodium chloride, acetaminophen, albuterol, sodium chloride flush   Vital Signs    Vitals:   09/20/18 0830 09/20/18 1709 09/20/18 2341 09/21/18 0550  BP: (!) 154/104 (!) 145/99 (!) 122/97   Pulse: 89 92 86   Resp: 16 16    Temp: 97.7 F (36.5 C) 98.6 F (37 C) 97.8 F (36.6 C)   TempSrc: Oral Oral Oral   SpO2: 97% 100% 100%   Weight:    (!) 176.4 kg  Height:    5\' 10"  (1.778 m)    Intake/Output Summary (Last 24 hours) at 09/21/2018 0711 Last data filed at 09/21/2018 0400 Gross per 24 hour  Intake -  Output 1600 ml  Net -1600 ml   Filed Weights   09/19/18 0500 09/20/18 0616 09/21/18 0550  Weight: (!) 179.2 kg (!) 182.2 kg (!) 176.4 kg    Telemetry    NSR, sinus tach - Personally Reviewed  ECG    NA - Personally Reviewed  Physical Exam   GEN: No  acute distress.   Neck: No  JVD Cardiac: RRR, no murmurs, rubs, or gallops.  Respiratory: Clear   to auscultation bilaterally. GI: Soft, nontender, non-distended, normal bowel sounds  MS:  Mild bilateral leg edema; No deformity. Neuro:   Nonfocal  Psych: Oriented and appropriate    Labs    Chemistry Recent Labs  Lab 09/17/18 1941 09/18/18 0207 09/19/18 0809 09/20/18 0757  NA 138 139 136 136  K 3.6 3.5 3.8 3.5  CL 107 104 103 101  CO2 25 25 25 27   GLUCOSE 128* 103* 89 100*  BUN 13 13 15 18   CREATININE 1.09 1.10 1.14 1.33*  CALCIUM 8.7* 8.9 9.1 9.1  PROT 6.6  --   --   --   ALBUMIN 3.3*  --   --    --   AST 48*  --   --   --   ALT 78*  --   --   --   ALKPHOS 68  --   --   --   BILITOT 0.7  --   --   --   GFRNONAA >60 >60 >60 >60  GFRAA >60 >60 >60 >60  ANIONGAP 6 10 8 8      Hematology Recent Labs  Lab 09/18/18 0207 09/19/18 0809 09/20/18 0757  WBC 7.7 7.3 6.9  RBC 4.13*  4.13* 4.28 4.27  HGB 12.7* 13.1 13.0  HCT 37.5* 38.8* 38.8*  MCV 90.8 90.7 90.9  MCH 30.8 30.6 30.4  MCHC 33.9 33.8 33.5  RDW 12.7 12.7 12.7  PLT 224 225 245    Cardiac Enzymes Recent Labs  Lab 09/17/18 1941 09/18/18 0207 09/18/18 0853  TROPONINI 0.03* 0.03* 0.03*   No results for input(s): TROPIPOC in the last 168 hours.   BNP Recent Labs  Lab 09/17/18 1926  BNP 158.0*     DDimer No results  for input(s): DDIMER in the last 168 hours.   Radiology    No results found.  Cardiac Studies   Echo:   09/18/18   1. Severe hypokinesis of the left ventricular, entire anterior wall and anteroseptal wall.  2. The left ventricle has a visually estimated ejection fraction of 15-20%. The cavity size was severely dilated. wall thickness is thin for patient age/size. Left ventricular diastolic Doppler parameters are indeterminate. Elevated left ventricular  end-diastolic pressure The E/e' is 16.3. Left ventricular diffuse hypokinesis.  3. The right ventricle has normal systolic function. The cavity was normal. There is no increase in right ventricular wall thickness. Right ventricular systolic pressure could not be assessed.  4. Small pericardial effusion.  5. The mitral valve is abnormal. Mitral valve regurgitation is moderate to severe by color flow Doppler. No evidence of mitral valve stenosis.  6. MV leaflets appear to have a small, central area of incomplete coaptation, likely due to annular dilation from LV dysfunction/chamber dilation.  7. The tricuspid valve is grossly normal.  8. The aortic valve is tricuspid. No stenosis of the aortic valve.  9. The pulmonic valve was grossly normal.  Pulmonic valve regurgitation is moderate is mild by color flow Doppler. 10. The inferior vena cava was normal in size with <50% respiratory variability. 11. The interatrial septum was not well visualized.  Patient Profile     28 y.o. male with a PMH of morbid obesity and asthma who is being seen for the evaluation of  CHF at the request of Dr. Brown.   Assessment & Plan    ACUTE SYSTOLIC HF: Plan is for right and left heart cath today.  Net negative 7.2  liters.  I started Entresto two days ago.   He should [facility]  Will increase Coreg. Possibly home today pending results of the cath.    ELEVATED CREAT:  Pending this morning.   I suspect that there is some element of hypoventilation obesity syndrome.  I cannot see his RV well enough on echo to evaluate function.  He will have the cath and pressures recorded.  It appears that a BMET has been ordered for this morning but is not resulted.   HTN:  This is being managed in the context of treating his CHF  MR:   Follow clinically.     For questions or updates, please contact [facility] Please consult [email or web address] for contact info under Cardiology/STEMI.   Signed, Merridy Pascoe, MD  09/21/2018, 7:11 AM    

## 2018-09-21 NOTE — Care Management (Signed)
Per Janina Mayo W/[employer]. Co-pay amount for Entresto 24-26 mg. BID, 30 day supply  $25.00 ,90 day supply $50.00. No PA required Pharmacies Are: [facility], [facility]

## 2018-09-21 NOTE — H&P (View-Only) (Signed)
Progress Note  Patient Name: Todd Griffith Date of Encounter: 09/21/2018  Primary Cardiologist:   No primary care provider on file.   Subjective   Breathing OK.  No pain.  No SOB.   Inpatient Medications    Scheduled Meds: . carvedilol  3.125 mg Oral BID WC  . Living Better with Heart Failure Book   Does not apply Once  . sacubitril-valsartan  1 tablet Oral BID  . sodium chloride flush  3 mL Intravenous Q12H   Continuous Infusions: . sodium chloride    . sodium chloride     PRN Meds: sodium chloride, acetaminophen, albuterol, sodium chloride flush   Vital Signs    Vitals:   09/20/18 0830 09/20/18 1709 09/20/18 2341 09/21/18 0550  BP: (!) 154/104 (!) 145/99 (!) 122/97   Pulse: 89 92 86   Resp: 16 16    Temp: 97.7 F (36.5 C) 98.6 F (37 C) 97.8 F (36.6 C)   TempSrc: Oral Oral Oral   SpO2: 97% 100% 100%   Weight:    (!) 176.4 kg  Height:    5\' 10"  (1.778 m)    Intake/Output Summary (Last 24 hours) at 09/21/2018 0711 Last data filed at 09/21/2018 0400 Gross per 24 hour  Intake -  Output 1600 ml  Net -1600 ml   Filed Weights   09/19/18 0500 09/20/18 0616 09/21/18 0550  Weight: (!) 179.2 kg (!) 182.2 kg (!) 176.4 kg    Telemetry    NSR, sinus tach - Personally Reviewed  ECG    NA - Personally Reviewed  Physical Exam   GEN: No  acute distress.   Neck: No  JVD Cardiac: RRR, no murmurs, rubs, or gallops.  Respiratory: Clear   to auscultation bilaterally. GI: Soft, nontender, non-distended, normal bowel sounds  MS:  Mild bilateral leg edema; No deformity. Neuro:   Nonfocal  Psych: Oriented and appropriate    Labs    Chemistry Recent Labs  Lab 09/17/18 1941 09/18/18 0207 09/19/18 0809 09/20/18 0757  NA 138 139 136 136  K 3.6 3.5 3.8 3.5  CL 107 104 103 101  CO2 25 25 25 27   GLUCOSE 128* 103* 89 100*  BUN 13 13 15 18   CREATININE 1.09 1.10 1.14 1.33*  CALCIUM 8.7* 8.9 9.1 9.1  PROT 6.6  --   --   --   ALBUMIN 3.3*  --   --    --   AST 48*  --   --   --   ALT 78*  --   --   --   ALKPHOS 68  --   --   --   BILITOT 0.7  --   --   --   GFRNONAA >60 >60 >60 >60  GFRAA >60 >60 >60 >60  ANIONGAP 6 10 8 8      Hematology Recent Labs  Lab 09/18/18 0207 09/19/18 0809 09/20/18 0757  WBC 7.7 7.3 6.9  RBC 4.13*  4.13* 4.28 4.27  HGB 12.7* 13.1 13.0  HCT 37.5* 38.8* 38.8*  MCV 90.8 90.7 90.9  MCH 30.8 30.6 30.4  MCHC 33.9 33.8 33.5  RDW 12.7 12.7 12.7  PLT 224 225 245    Cardiac Enzymes Recent Labs  Lab 09/17/18 1941 09/18/18 0207 09/18/18 0853  TROPONINI 0.03* 0.03* 0.03*   No results for input(s): TROPIPOC in the last 168 hours.   BNP Recent Labs  Lab 09/17/18 1926  BNP 158.0*     DDimer No results  for input(s): DDIMER in the last 168 hours.   Radiology    No results found.  Cardiac Studies   Echo:   09/18/18   1. Severe hypokinesis of the left ventricular, entire anterior wall and anteroseptal wall.  2. The left ventricle has a visually estimated ejection fraction of 15-20%. The cavity size was severely dilated. wall thickness is thin for patient age/size. Left ventricular diastolic Doppler parameters are indeterminate. Elevated left ventricular  end-diastolic pressure The E/e' is 67.8. Left ventricular diffuse hypokinesis.  3. The right ventricle has normal systolic function. The cavity was normal. There is no increase in right ventricular wall thickness. Right ventricular systolic pressure could not be assessed.  4. Small pericardial effusion.  5. The mitral valve is abnormal. Mitral valve regurgitation is moderate to severe by color flow Doppler. No evidence of mitral valve stenosis.  6. MV leaflets appear to have a small, central area of incomplete coaptation, likely due to annular dilation from LV dysfunction/chamber dilation.  7. The tricuspid valve is grossly normal.  8. The aortic valve is tricuspid. No stenosis of the aortic valve.  9. The pulmonic valve was grossly normal.  Pulmonic valve regurgitation is moderate is mild by color flow Doppler. 10. The inferior vena cava was normal in size with <50% respiratory variability. 11. The interatrial septum was not well visualized.  Patient Profile     28 y.o. male with a PMH of morbid obesity and asthma who is being seen for the evaluation of  CHF at the request of Dr. Manson Passey.   Assessment & Plan    ACUTE SYSTOLIC HF: Plan is for right and left heart cath today.  Net negative 7.2  liters.  I started Entresto two days ago.   He should follow up [facility] hospital [facility].  Will increase Coreg. Possibly home today pending results of the cath.    ELEVATED CREAT:  Pending this morning.   I suspect that there is some element of hypoventilation obesity syndrome.  I cannot see his RV well enough on echo to evaluate function.  He will have the cath and pressures recorded.  It appears that a BMET has been ordered for this morning but is not resulted.   HTN:  This is being managed in the context of treating his CHF  MR:   Follow clinically.     For questions or updates, please contact [facility] Please consult [email or web address] for contact info under Cardiology/STEMI.   Signed, Rollene Rotunda, MD  09/21/2018, 7:11 AM

## 2018-09-21 NOTE — Plan of Care (Signed)
  Problem: Education: Goal: Knowledge of General Education information will improve Description: Including pain rating scale, medication(s)/side effects and non-pharmacologic comfort measures Outcome: Progressing   Problem: Education: Goal: Ability to verbalize understanding of medication therapies will improve Outcome: Progressing   

## 2018-09-22 DIAGNOSIS — Z6841 Body Mass Index (BMI) 40.0 and over, adult: Secondary | ICD-10-CM

## 2018-09-22 DIAGNOSIS — I428 Other cardiomyopathies: Secondary | ICD-10-CM

## 2018-09-22 DIAGNOSIS — Z21 Asymptomatic human immunodeficiency virus [HIV] infection status: Secondary | ICD-10-CM

## 2018-09-22 DIAGNOSIS — Z720 Tobacco use: Secondary | ICD-10-CM

## 2018-09-22 DIAGNOSIS — B20 Human immunodeficiency virus [HIV] disease: Secondary | ICD-10-CM

## 2018-09-22 LAB — T-HELPER CELLS (CD4) COUNT (NOT AT ARMC)
CD4 % Helper T Cell: 32 % — ABNORMAL LOW (ref 33–65)
CD4 T Cell Abs: 739 /uL (ref 400–1790)

## 2018-09-22 LAB — HIV ANTIBODY (ROUTINE TESTING W REFLEX): HIV Screen 4th Generation wRfx: REACTIVE

## 2018-09-22 MED ORDER — HEPARIN SODIUM (PORCINE) 5000 UNIT/ML IJ SOLN: 5000.0000 [IU] | Freq: Three times a day (TID) | INTRAMUSCULAR | Status: DC

## 2018-09-22 MED ORDER — SODIUM CHLORIDE 0.9 % IV SOLN: 250.0000 mL | INTRAVENOUS | Status: DC | PRN

## 2018-09-22 MED ORDER — FUROSEMIDE 80 MG PO TABS: 80.0000 mg | ORAL_TABLET | Freq: Every day | ORAL | 0 refills | Status: DC

## 2018-09-22 MED ORDER — LIVING BETTER WITH HEART FAILURE BOOK: 1.0000 | Freq: Once | 0 refills | Status: AC

## 2018-09-22 MED ORDER — SPIRONOLACTONE 25 MG PO TABS: 25.0000 mg | ORAL_TABLET | Freq: Every day | ORAL | 0 refills | Status: DC

## 2018-09-22 MED ORDER — FUROSEMIDE 10 MG/ML IJ SOLN
40.0000 mg | Freq: Two times a day (BID) | INTRAMUSCULAR | Status: AC
  Administered 2018-09-22: 40 mg via INTRAVENOUS
  Filled 2018-09-22: qty 4

## 2018-09-22 MED ORDER — CARVEDILOL 6.25 MG PO TABS: 6.2500 mg | ORAL_TABLET | Freq: Two times a day (BID) | ORAL | 0 refills | Status: DC

## 2018-09-22 MED ORDER — SODIUM CHLORIDE 0.9% FLUSH: 3.0000 mL | INTRAVENOUS | Status: DC | PRN

## 2018-09-22 MED ORDER — SPIRONOLACTONE 25 MG PO TABS
25.0000 mg | ORAL_TABLET | Freq: Every day | ORAL | Status: DC
  Administered 2018-09-22: 25 mg via ORAL
  Filled 2018-09-22: qty 1

## 2018-09-22 MED ORDER — ASPIRIN 81 MG PO CHEW: 81.0000 mg | CHEWABLE_TABLET | Freq: Every day | ORAL | 0 refills | Status: DC

## 2018-09-22 MED ORDER — SODIUM CHLORIDE 0.9% FLUSH
3.0000 mL | Freq: Two times a day (BID) | INTRAVENOUS | Status: DC
  Administered 2018-09-22: 3 mL via INTRAVENOUS

## 2018-09-22 MED ORDER — BIKTARVY 50-200-25 MG PO TABS: 1.0000 | ORAL_TABLET | Freq: Every day | ORAL | 5 refills | Status: DC

## 2018-09-22 MED ORDER — SACUBITRIL-VALSARTAN 24-26 MG PO TABS: 1.0000 | ORAL_TABLET | Freq: Two times a day (BID) | ORAL | 0 refills | Status: DC

## 2018-09-22 MED ORDER — LABETALOL HCL 5 MG/ML IV SOLN: 10.0000 mg | INTRAVENOUS | Status: AC | PRN

## 2018-09-22 MED ORDER — HYDRALAZINE HCL 20 MG/ML IJ SOLN: 10.0000 mg | INTRAMUSCULAR | Status: AC | PRN

## 2018-09-22 MED FILL — FUROSEMIDE 80 MG TAB: 80 | 30 days supply | Qty: 30 | Fill #0

## 2018-09-22 MED FILL — ASPIRIN LOW DOSE 81 MG CHEW: 81 | 30 days supply | Qty: 30 | Fill #0

## 2018-09-22 MED FILL — ENTRESTO 24 MG-26 MG TABLET: 24-26 | 30 days supply | Qty: 60 | Fill #0

## 2018-09-22 MED FILL — BIKTARVY 50-200-25 MG TABS: 50-200-25 | 30 days supply | Qty: 30 | Fill #0

## 2018-09-22 MED FILL — CARVEDILOL 6.25 MG TABLET: 6.25 | 30 days supply | Qty: 60 | Fill #0

## 2018-09-22 MED FILL — SPIRONOLACTONE 25 MG TABLET: 25 | 30 days supply | Qty: 30 | Fill #0

## 2018-09-22 NOTE — TOC Transition Note (Signed)
Transition of Care [facility]) - CM/SW Discharge Note   Patient Details  Name: Todd Griffith [facility] MRN: [id number] Date of Birth: [birth year]  Transition of Care [facility]) CM/SW Contact:  Zenon Mayo, RN Phone Number: 09/22/2018, 2:53 PM   Clinical Narrative:    From home with his girlfriend, pta indep.  NCM offered choice for [facility] for CHF , he states he does not want [facility] services. Patient will be on Entresto, TOC is filling the first 30 day free , NCM gave patient the 10.00 copay card for refills and also informed him of the information given in the benefit check by his insurance company. He is for dc home today.    Final next level of care: Home/Self Care Barriers to Discharge: No Barriers Identified   Patient Goals and CMS Choice Patient states their goals for this hospitalization and ongoing recovery are:: get better CMS Medicare.gov Compare Post Acute Care list provided to:: Patient Choice offered to / list presented to : NA  Discharge Placement                       Discharge Plan and Services In[place] Referral: NA Discharge Planning Services: CM Consult Post Acute Care Choice: (Does not want HH services)          DME Arranged: (NA)         HH Arranged: NA          Social Determinants of Health (SDOH) Interventions     Readmission Risk Interventions No flowsheet data found.

## 2018-09-22 NOTE — Progress Notes (Signed)
Progress Note  Patient Name: Todd Griffith Date of Encounter: 09/22/2018  Primary Cardiologist:   No primary care provider on file.   Subjective   No pain.  Breathing OK.    Inpatient Medications    Scheduled Meds: . aspirin  81 mg Oral Daily  . carvedilol  6.25 mg Oral BID WC  . enoxaparin (LOVENOX) injection  0.5 mg/kg Subcutaneous QHS  . furosemide  40 mg Intravenous BID  . Living Better with Heart Failure Book   Does not apply Once  . sacubitril-valsartan  1 tablet Oral BID  . sodium chloride flush  3 mL Intravenous Q12H   Continuous Infusions: . sodium chloride     PRN Meds: sodium chloride, acetaminophen, albuterol, sodium chloride flush   Vital Signs    Vitals:   09/21/18 1535 09/22/18 0033 09/22/18 0611 09/22/18 0742  BP: 128/86 133/88  (!) 144/109  Pulse: 84 94  (!) 101  Resp: 16     Temp: 97.8 F (36.6 C) 98.6 F (37 C)  98.3 F (36.8 C)  TempSrc: Oral Oral  Oral  SpO2: 98% 97%  97%  Weight:   (!) 176 kg   Height:        Intake/Output Summary (Last 24 hours) at 09/22/2018 0746 Last data filed at 09/21/2018 2054 Gross per 24 hour  Intake -  Output 1000 ml  Net -1000 ml   Filed Weights   09/20/18 0616 09/21/18 0550 09/22/18 0611  Weight: (!) 182.2 kg (!) 176.4 kg (!) 176 kg    Telemetry    NSR - Personally Reviewed  ECG    NA - Personally Reviewed  Physical Exam   GEN: No  acute distress.   Neck: No  JVD Cardiac: RRR, no murmurs, rubs, or gallops.  Respiratory: Clear   to auscultation bilaterally. GI: Soft, nontender, non-distended, normal bowel sounds.   MS:  Mild edema at the right wrist cath site.  Trace leg edema; No deformity. Neuro:   Nonfocal  Psych: Oriented and appropriate   Labs    Chemistry Recent Labs  Lab 09/17/18 1941  09/19/18 0809 09/20/18 0757  09/21/18 0946 09/21/18 0947 09/21/18 1421  NA 138   < > 136 136   < > 141 141 138  K 3.6   < > 3.8 3.5   < > 3.8 3.8 3.9  CL 107   < > 103 101  --    --   --  103  CO2 25   < > 25 27  --   --   --  29  GLUCOSE 128*   < > 89 100*  --   --   --  89  BUN 13   < > 15 18  --   --   --  15  CREATININE 1.09   < > 1.14 1.33*  --   --   --  1.19  CALCIUM 8.7*   < > 9.1 9.1  --   --   --  8.9  PROT 6.6  --   --   --   --   --   --   --   ALBUMIN 3.3*  --   --   --   --   --   --   --   AST 48*  --   --   --   --   --   --   --   ALT 78*  --   --   --   --   --   --   --  ALKPHOS 68  --   --   --   --   --   --   --   BILITOT 0.7  --   --   --   --   --   --   --   GFRNONAA >60   < > >60 >60  --   --   --  >60  GFRAA >60   < > >60 >60  --   --   --  >60  ANIONGAP 6   < > 8 8  --   --   --  6   < > = values in this interval not displayed.     Hematology Recent Labs  Lab 09/18/18 0207 09/19/18 0809 09/20/18 0757 09/21/18 0939 09/21/18 0946 09/21/18 0947  WBC 7.7 7.3 6.9  --   --   --   RBC 4.13*  4.13* 4.28 4.27  --   --   --   HGB 12.7* 13.1 13.0 13.3 13.6 13.9  HCT 37.5* 38.8* 38.8* 39.0 40.0 41.0  MCV 90.8 90.7 90.9  --   --   --   MCH 30.8 30.6 30.4  --   --   --   MCHC 33.9 33.8 33.5  --   --   --   RDW 12.7 12.7 12.7  --   --   --   PLT 224 225 245  --   --   --     Cardiac Enzymes Recent Labs  Lab 09/17/18 1941 09/18/18 0207 09/18/18 0853  TROPONINI 0.03* 0.03* 0.03*   No results for input(s): TROPIPOC in the last 168 hours.   BNP Recent Labs  Lab 09/17/18 1926  BNP 158.0*     DDimer No results for input(s): DDIMER in the last 168 hours.   Radiology    No results found.  Cardiac Studies   Echo:   09/18/18   1. Severe hypokinesis of the left ventricular, entire anterior wall and anteroseptal wall.  2. The left ventricle has a visually estimated ejection fraction of 15-20%. The cavity size was severely dilated. wall thickness is thin for patient age/size. Left ventricular diastolic Doppler parameters are indeterminate. Elevated left ventricular  end-diastolic pressure The E/e' is 16.3. Left ventricular  diffuse hypokinesis.  3. The right ventricle has normal systolic function. The cavity was normal. There is no increase in right ventricular wall thickness. Right ventricular systolic pressure could not be assessed.  4. Small pericardial effusion.  5. The mitral valve is abnormal. Mitral valve regurgitation is moderate to severe by color flow Doppler. No evidence of mitral valve stenosis.  6. MV leaflets appear to have a small, central area of incomplete coaptation, likely due to annular dilation from LV dysfunction/chamber dilation.  7. The tricuspid valve is grossly normal.  8. The aortic valve is tricuspid. No stenosis of the aortic valve.  9. The pulmonic valve was grossly normal. Pulmonic valve regurgitation is moderate is mild by color flow Doppler. 10. The inferior vena cava was normal in size with <50% respiratory variability. 11. The interatrial septum was not well visualized.  CARDIAC CATH   LV end diastolic pressure is severely elevated.  Hemodynamic findings consistent with mild-moderate pulmonary hypertension.  Single-vessel disease: Small caliber 1st Diag lesion is 70% stenosed -not optimal PCI target   SUMMARY  Severe Dilated Nonischemic Cardiomyopathy with evidence of Combined Systolic and Diastolic Heart Failure -EF estimated 15 to 20% by Echo, with reduced Cardiac Index of 3.01  --  PCWP 23 mmHg, LVEDP with a PA mean 35 mmHg.  Single-vessel disease noted in the small caliber proximal first diagonal branch -> not optimal for PCI.  Recommend medical management  Hemo Data   Most Recent Value  Fick Cardiac Output 8.33 L/min  Fick Cardiac Output Index 3.01 (L/min)/BSA  RA A Wave 9 mmHg  RA V Wave 2 mmHg  RA Mean 4 mmHg  RV Systolic Pressure 38 mmHg  RV Diastolic Pressure 5 mmHg  RV EDP 12 mmHg  PA Systolic Pressure 42 mmHg  PA Diastolic Pressure 29 mmHg  PA Mean 35 mmHg  PW A Wave 22 mmHg  PW V Wave 22 mmHg  PW Mean 21 mmHg  AO Systolic Pressure 127 mmHg   AO Diastolic Pressure 96 mmHg  AO Mean 111 mmHg  LV Systolic Pressure 121 mmHg  LV Diastolic Pressure 16 mmHg  LV EDP 33 mmHg  AOp Systolic Pressure 124 mmHg  AOp Diastolic Pressure 90 mmHg  AOp Mean Pressure 104 mmHg  LVp Systolic Pressure 124 mmHg  LVp Diastolic Pressure 16 mmHg  LVp EDP Pressure 34 mmHg  QP/QS 1  TPVR Index 11.64 HRUI  TSVR Index 34.24 HRUI  PVR SVR Ratio 0.14  TPVR/TSVR Ratio 0.34     Patient Profile     28 y.o. male with a PMH of morbid obesity and asthma who is being seen for the evaluation of  CHF at the request of Dr. Manson Passey.   Assessment & Plan    ACUTE SYSTOLIC HF: Cath as above.  Non ischemic.  Question related to ETOH vs HTN.  He does have increased LVEDP and mildly increased PAP.  Needs continued titration of meds, low salt, home diuresis.  Most significant issues are going to be weight loss, med, salt/fluid compliance and work up of probable sleep apnea.   Beta blocker increased.  Started Entresto this admission.  I will start spironolactone.  Set up next week in HF clinic for labs and a new patient follow up.  Started Entresto this admission.   Continue current dose.  Discharge on 80 mg qam Lasix.    CAD:  Small vessel.  Young aged.  Needs aggressive risk reduction.    ELEVATED CREAT:  Creat is stable.    HTN:  This is being managed in the context of treating his CHF  MR:   Will follow up with echoes in the future.  For questions or updates, please contact [facility] Please consult [email or web address] for contact info under Cardiology/STEMI.   Signed, Rollene Rotunda, MD  09/22/2018, 7:46 AM

## 2018-09-22 NOTE — Progress Notes (Signed)
Infectious Diseases Pharmacy Note  A 30 day supply of Biktarvy has been delivered to Mr. Carchi. We have activated a copay card which brought his cost for this medication to $0.00. Mr. Homeyer was counseled on this medication including administration, side effects and follow up instructions. He will follow-up with [facility].    Jimmy Footman, PharmD, BCPS, BCIDP Infectious Diseases Clinical Pharmacist Phone: [phone] 09/22/2018 2:54 PM

## 2018-09-22 NOTE — Progress Notes (Signed)
ID was contacted.  They reported that Mr. Todd Griffith does not require any further inpatient work-up.  He will be started on antiretroviral therapy and can go home today from their perspective.  They reported that his girlfriend a newborn daughter are also being worked up and considered for therapy.  Cardiology was also contacted and they reported that Mr. Todd Griffith is ready for discharge from their perspective.  Mr. Todd Griffith is seen at bedside he reported that he felt improved from admission was prepared to go home today.  He was offered the option of speaking with a therapist or chaplain declined.  Matilde Haymaker, MD

## 2018-09-22 NOTE — Discharge Summary (Signed)
[facility] Discharge Summary  Patient name: Todd Griffith Medical record number: [id number] Date of birth: [birth year] Age: 28 y.o. Gender: male Date of Admission: 09/17/2018  Date of Discharge: 09/22/2018 Admitting Physician: Martyn Malay, MD  Primary Care Provider: Patient, No Pcp Per Consultants: cardiology, ID  Indication for Hospitalization: SOB  Discharge Diagnoses/Problem List:  HIV, new diagnosis New onset heart failure with reduced ejection fraction CAD Hypertension Nutrition Asthma  Disposition: Discharge home  Discharge Condition: Stable  Discharge Exam:  General: Alert and cooperative and appears to be in no acute distress.  Lying in bed comfortably HEENT: Difficult to assess JVD secondary to neck girth. Cardio: Normal S1 and S2, no S3 or S4. Rhythm is regular. No murmurs or rubs.   Pulm: Difficult to auscultate lung sounds secondary to body habitus.  No obvious wheezing/stridor.  Difficult to assess for rales.  Mild work of breathing on room air (normal per patient) Abdomen: Bowel sounds normal. Abdomen soft and non-tender.  Extremities: Trace LE edema. Warm/ well perfused.  Strong radial pulse. Neuro: Cranial nerves grossly intact  Brief Hospital Course:  28 year old male admitted for shortness of breath and found to have new onset heart failure.  His past medical history was significant for asthma.  Heart failure with reduced ejection fraction. While in the hospital right heart cath/left heart cath showed an ejection fraction of 15-20% with severe mitral valve regurgitation, severe dilated nonischemic cardiomyopathy and systolic with diastolic heart failure.  His heart failure is likely due to genetics in addition to significant obesity with likely OSA and hypertension.  During his time in the hospital, he was diuresed with IV Lasix and started on carvedilol, Entresto, spironolactone.  On the day of discharge he was net -8.2 L  compared to admission.  On the day of discharge, he was found to be breathing comfortably on room air and is medically stable for discharge home with follow-up with cardiology.  HIV, new diagnosis HIV screening performed on admission was found to be positive with confirmatory testing showing HIV 1+.  Infectious disease was consulted and was able to assess in hospital.  Initial labs were drawn, he was started on Biktarvy with plans to follow-up with infectious disease.  In addition to the wellbeing of Mr. Brideau, there is also concern for the health of his girlfriend and their baby daughter who was born during the week of his hospital admission.  His girlfriend and daughter were both tested on 6/23.  The daughter will remain on formula until further information can be obtained.  ID is aware performing appropriate work-up and treatment for girlfriend and daughter.  Concern for OSA/OHS at bedtime Mr. Schumpert has a BMI of 55 and appears to have mild shortness of breath at baseline.  He has been told in the past that he snores and at times stops breathing while asleep.  Based on his body mass and current comorbidities (hypertension, heart failure) he is likely suffering from OSA/OHS which will be contributing to his poor health at this time.  In order to medically optimize him, a referral to ambulatory sleep medicine was made.  Please follow-up with this referral and ensure that he is getting appropriate diagnosis and treatment for his very likely OSA.  Issues for Follow Up:  1. Ensure good follow-up with heart failure clinic where his heart failure medication will be managed. 2. Ensure good follow-up with infectious disease for appropriate management of his new diagnosis of HIV. 3. BMP to  assess creatinine. 4. New heart failure medications: Lasix p.o. 80 mg daily, carvedilol 3.125 mg twice daily, Entresto twice daily, spironolactone. 5. New HIV medication: Biktarvy 50-200-25 daily 6. Follow-up regarding  sleep study.  Referral to sleep medicine placed for evaluation for OSA/OHS  Significant Procedures: 6/22-left heart/right heart cath  Significant Labs and Imaging:  Recent Labs  Lab 09/18/18 0207 09/19/18 0809 09/20/18 0757 09/21/18 0939 09/21/18 0946 09/21/18 0947  WBC 7.7 7.3 6.9  --   --   --   HGB 12.7* 13.1 13.0 13.3 13.6 13.9  HCT 37.5* 38.8* 38.8* 39.0 40.0 41.0  PLT 224 225 245  --   --   --    Recent Labs  Lab 09/17/18 1941 09/18/18 0207 09/19/18 0809 09/20/18 0757 09/21/18 0939 09/21/18 0946 09/21/18 0947 09/21/18 1421  NA 138 139 136 136 141 141 141 138  K 3.6 3.5 3.8 3.5 3.7 3.8 3.8 3.9  CL 107 104 103 101  --   --   --  103  CO2 25 25 25 27   --   --   --  29  GLUCOSE 128* 103* 89 100*  --   --   --  89  BUN 13 13 15 18   --   --   --  15  CREATININE 1.09 1.10 1.14 1.33*  --   --   --  1.19  CALCIUM 8.7* 8.9 9.1 9.1  --   --   --  8.9  ALKPHOS 68  --   --   --   --   --   --   --   AST 48*  --   --   --   --   --   --   --   ALT 78*  --   --   --   --   --   --   --   ALBUMIN 3.3*  --   --   --   --   --   --   --     Dg Chest Portable 1 View  Result Date: 09/17/2018 CLINICAL DATA:  Swelling in feet. EXAM: PORTABLE CHEST 1 VIEW COMPARISON:  Aug 21, 2018 FINDINGS: The cardiac size is significantly enlarged. There is vascular congestion without overt pulmonary edema. Evaluation is limited by suboptimal technique and patient body habitus. There is no pneumothorax. No acute osseous abnormality. IMPRESSION: Cardiomegaly with vascular congestion. Electronically Signed   By: Constance Holster M.D.   On: 09/17/2018 20:18     Results/Tests Pending at Time of Discharge:  Hep A antibody Hep B antibody RPR G6PD Quant gold HLA [id number] HIV RNA, PCR   Discharge Medications:  Allergies as of 09/22/2018   No Known Allergies     Medication List    TAKE these medications   albuterol 108 (90 Base) MCG/ACT inhaler Commonly known as: VENTOLIN HFA Inhale 1-2  puffs into the lungs every 6 (six) hours as needed for wheezing or shortness of breath.   aspirin 81 MG chewable tablet Chew 1 tablet (81 mg total) by mouth daily. Start taking on: September 23, 2018   Biktarvy 50-200-25 MG Tabs tablet Generic drug: bictegravir-emtricitabine-tenofovir AF Take 1 tablet by mouth daily.   carvedilol 6.25 MG tablet Commonly known as: COREG Take 1 tablet (6.25 mg total) by mouth 2 (two) times daily with a meal.   furosemide 80 MG tablet Commonly known as: Lasix Take 1 tablet (80 mg total) by mouth daily for 30  days. Take one TAB every morning.   Living Better with Heart Failure Book Misc 1 each by Does not apply route once for 1 dose.   sacubitril-valsartan 24-26 MG Commonly known as: ENTRESTO Take 1 tablet by mouth 2 (two) times daily.   spironolactone 25 MG tablet Commonly known as: ALDACTONE Take 1 tablet (25 mg total) by mouth daily. Start taking on: September 23, 2018       Discharge Instructions: Please refer to Patient Instructions section of EMR for full details.  Patient was counseled important signs and symptoms that should prompt return to medical care, changes in medications, dietary instructions, activity restrictions, and follow up appointments.   Follow-Up Appointments: Follow-up Information    Lanae Boast, FNP Follow up on 10/07/2018.   Specialty: Family Medicine Why: 9:20 to get established as a new patient, you will go in to this apt. Contact information: [place] [address] [phone]        Darrick Grinder D, NP Follow up on 09/30/2018.   Specialty: Cardiology Why: Please arrive 15 minutes early for your 10:00am post-hospital follow-up appointment. Please call office for parking instructions and code to get into parking lot.  Contact information: [address] [address] [phone]           Matilde Haymaker, MD 09/22/2018, 9:07 PM PGY-1, [facility]

## 2018-09-22 NOTE — Consult Note (Signed)
[facility]    Date of Admission:  09/17/2018                 Reason for Consult: HIV Positive   Referring Provider: Candy Sledgehamp / [employer] Primary Care Provider: Patient, No Pcp Per   Assessment/Plan:  Mr. Beverley FiedlerLarandall is a, 28 y/o  morbidly obese male admitted with worsening dyspnea, exertional chest pain, and orthopnea found to have acute combined systolic and diastolic heart failure and positive HIV-1 testing during routine screening.   HIV-1 - Newly diagnosed. Discussed the pathogenesis, transmission, risks if left untreated, and treatment options for HIV. We have arranged for partner testing to be performed at the [facility] for his significant other and newborn to be tested. He has [facility] and should have no problems obtaining his medication with a copay card. [facility] has been notified and will work to obtain 30 day supply of medication. Currently has no signs/symptoms of opportunistic infection at present. Will check basic HIV blood work today. Plan to start him on Biktarvy. Will arrange for follow up in [facility] to establish care. Ok for discharge from ID perspective.  Acute combined systolic and diastolic heart failure - Newly diagnosed during this admission s/p cardiac catheterization with decreased ejection fraction to 15-20% with severe hypokinesis. Cardiology recommended aggressive risk factor modification. Continue treatment per Cardiology.  Morbid Obesity - BMI of 55.67. Recommend aggressive weight loss through nutrition and physical activity as guided by primary and cardiology teams.   Tobacco use - Encouraged tobacco cessation to reduce risk of continued cardiovascular or development of respiratory or malignant disease in the future.   Principal Problem:   Acute combined systolic and diastolic heart failure (HCC) Active Problems:   Volume overload   Morbid obesity (HCC)   Dilated  cardiomyopathy (HCC)   HIV disease (HCC)   . aspirin  81 mg Oral Daily  . carvedilol  6.25 mg Oral BID WC  . furosemide  40 mg Intravenous BID  . heparin  5,000 Units Subcutaneous Q8H  . Living Better with Heart Failure Book   Does not apply Once  . sacubitril-valsartan  1 tablet Oral BID  . sodium chloride flush  3 mL Intravenous Q12H  . sodium chloride flush  3 mL Intravenous Q12H  . spironolactone  25 mg Oral Daily     HPI: Rhyse Marquel Ehlert is a 28 y.o. male with previous medical history of asthma and morbid obesity admitted with several months of worsening dyspnea as well as orthopnea with associated weight gain and lower extremity edema. He has experienced new onset exertional chest pain. Cariology workup with 2D echocardiogram showing ejection fraction of 15-20% with severe hypokinesis of the left ventricular, and entire anterior and anteroseptal wall. Cath performed showed severe dilated non-ischemic cardiomyopathy with evidence of combine systolic and diastolic heart failure with the recommendation of diuresis as well as aggressive risk factor modification.  During admission screening Mr. Collins ScotlandHouse was noted to have a positive HIV antibody test with confirmatory HIV-1 result. This is a new diagnosis for him. He has never been tested for HIV in the past to his knowledge. Risk factors for acquiring HIV include heterosexual sexual contact. He is currently in a relationship of 4 years with his girlfriend who gave birth 4 days ago to a baby girl. Has been sexually active within the last month.  She was last tested in December. He has informed his  mother of his positive status thus far and she is supportive.   Mr. Munkres currently works for [employer] of [place] and has insurance coverage through [employer]. He drinks alcohol socially and is a current some day smoker of tobacco. No recreational or illicit drug use at present. No history of prior mental health issues. He is feeling okay  today and now has increased anxiety with his new diagnosis. Denies feelings of being down, depressed or hopeless. His goal is to be around for his children.    Review of Systems: Review of Systems  Constitutional: Negative for chills, fever and weight loss.  Respiratory: Negative for cough, shortness of breath and wheezing.   Cardiovascular: Negative for chest pain and leg swelling.  Gastrointestinal: Negative for abdominal pain, constipation, diarrhea, nausea and vomiting.  Skin: Negative for rash.     Past Medical History:  Diagnosis Date  . Asthma   . Bronchitis     Social History   Tobacco Use  . Smoking status: Current Some Day Smoker    Types: Cigarettes  . Smokeless tobacco: Never Used  Substance Use Topics  . Alcohol use: Yes    Alcohol/week: 3.0 standard drinks    Types: 3 Cans of beer per week    Frequency: Never    Comment: socially   . Drug use: Not Currently    Family History  Problem Relation Age of Onset  . Hypertension Mother   . Diabetes Mellitus II Father   . CAD Father        Died at 71 of 'CAD' while sleeping   . Obesity Sister      No Known Allergies  OBJECTIVE: Blood pressure (!) 144/109, pulse (!) 101, temperature 98.3 F (36.8 C), temperature source Oral, resp. rate 16, height 5\' 10"  (1.778 m), weight (!) 176 kg, SpO2 97 %.  Physical Exam Constitutional:      General: He is not in acute distress.    Appearance: He is well-developed. He is obese.  Eyes:     Conjunctiva/sclera: Conjunctivae normal.  Neck:     Musculoskeletal: Neck supple.  Cardiovascular:     Rate and Rhythm: Normal rate and regular rhythm.     Heart sounds: Normal heart sounds. No murmur. No friction rub. No gallop.   Pulmonary:     Effort: Pulmonary effort is normal. No respiratory distress.     Breath sounds: Decreased breath sounds present. No wheezing or rales.  Chest:     Chest wall: No tenderness.  Abdominal:     General: Bowel sounds are normal.      Palpations: Abdomen is soft.     Tenderness: There is no abdominal tenderness.  Lymphadenopathy:     Cervical: No cervical adenopathy.  Skin:    General: Skin is warm and dry.     Findings: No rash.  Neurological:     Mental Status: He is alert and oriented to person, place, and time.  Psychiatric:        Behavior: Behavior normal.        Thought Content: Thought content normal.        Judgment: Judgment normal.     Lab Results Lab Results  Component Value Date   WBC 6.9 09/20/2018   HGB 13.9 09/21/2018   HCT 41.0 09/21/2018   MCV 90.9 09/20/2018   PLT 245 09/20/2018    Lab Results  Component Value Date   CREATININE 1.19 09/21/2018   BUN 15 09/21/2018   NA  138 09/21/2018   K 3.9 09/21/2018   CL 103 09/21/2018   CO2 29 09/21/2018    Lab Results  Component Value Date   ALT 78 (H) 09/17/2018   AST 48 (H) 09/17/2018   ALKPHOS 68 09/17/2018   BILITOT 0.7 09/17/2018     Microbiology: Recent Results (from the past 240 hour(s))  SARS Coronavirus 2     Status: None   Collection Time: 09/17/18  9:58 PM  Result Value Ref Range Status   SARS Coronavirus 2 NOT DETECTED NOT DETECTED Final    Comment: (NOTE) SARS-CoV-2 target nucleic acids are NOT DETECTED. The SARS-CoV-2 RNA is generally detectable in upper and lower respiratory specimens during the acute phase of infection.  Negative  results do not preclude SARS-CoV-2 infection, do not rule out co-infections with other pathogens, and should not be used as the sole basis for treatment or other patient management decisions.  Negative results must be combined with clinical observations, patient history, and epidemiological information. The expected result is Not Detected. Fact Sheet for Patients: http://www.biofiredefense.com/wp-content/uploads/2020/03/BIOFIRE-COVID -19-patients.pdf Fact Sheet for Healthcare Providers: http://www.biofiredefense.com/wp-content/uploads/2020/03/BIOFIRE-COVID -19-hcp.pdf This test is not  yet approved or cleared by the [place] and  has been authorized for detection and/or diagnosis of SARS-CoV-2 by FDA under an Emergency Use Authorization (EUA).  This EUA will remain in effec t (meaning this test can be used) for the duration of  the COVID-19 declaration under Section 564(b)(1) of the Act, 21 U.S.C. section 360bbb-3(b)(1), unless the authorization is terminated or revoked sooner. Performed at [facility], [place] [address] [place], [place] [address]      Terri Piedra, [place] [facility] [phone] Pager  09/22/2018  1:23 PM

## 2018-09-22 NOTE — Progress Notes (Addendum)
Family Medicine Teaching Service Daily Progress Note Intern Pager: [phone]  Patient name: Todd Griffith Medical record number: [id number] Date of birth: 1990/11/14 Age: 28 y.o. Gender: male  Primary Care Provider: Patient, No Pcp Per Consultants: none Code Status: FULL  Pt Overview and Major Events to Date:  6/18-admission for new onset CHF  Assessment and Plan: Todd Griffith is a 28 y.o. male presenting with worsening BLE edema and orthopnea. PMH is significant for morbid obesity and asthma.  HIV positive HIV 1 antibody positive.  He was at this morning about his diagnosis being HIV positive.  He was informed that would be doing a further work-up to better assess his HIV status.  We spoke briefly about how this can be well controlled chronic disease but that he does have to take precautions for all of his sexual relationships from this point forward.  Plan to follow-up in the afternoon for further questions.  Following his discussion with ID, he reported that his girlfriend and daughter are on their way to be tested today and that his daughter is currently on only formula. -Follow-up CD4/viral load -ID following, appreciate recommendations  New onset HFrEF EF 15-20%, severe mitral valve regurg. Right heart, left heart cath performed on 1/47 without complication.  Cath showed severe dilated nonischemic cardiomyopathy, systolic/diastolic heart failure, EF 15-20%.  1.0 L urine output on 6/22, net -8.2 L for hospitalization so far.  Weight down 5.2 kg her hospitalization so far. - cardiology following, appreciate recs - IV Lasix 7m BID per cards - Carvedilol 3.125 mg twice daily - Entresto twice daily - L/RHC on 6/22  CAD -ASA  Hypertension Blood pressure 133/88 overnight. -Heart failure medications as above  Creatinine rise-resolved Creatinine on admission 1.09.  1.19 this a.m. -Daily BMP  Elevated troponin-resolved Trops 0.03x3.  No chest pain this  morning. -No further work-up  Elevated AST/ALT AST 48, ALT 78.    Likely secondary to heart failure -Continue to monitor  Nutrition Nutrition has met with him for education regarding weight loss.  Normocytic Anemia-resolved - monitor CBC  Asthma -Albuterol PRN  Social Needs - SW consult for PCP, [facility]  FEN/GI: Regular Prophylaxis: Lovenox   Disposition: 1-2 additional days of hospitalization anticipated prior to discharge.  Subjective:  No acute events overnight.  Mildly elevated heart rate up to 106.  This morning he reports no chest pain reports that he is breathing comfortably on room air.  He is clearly shocked from his diagnosis of HIV and had several follow-up questions about whether this is related to his current heart failure.  After brief discussion about HIV, he was informed that we would follow-up again in the afternoon to see if he had more questions.  Objective: Temp:  [97.3 F (36.3 C)-98.6 F (37 C)] 98.6 F (37 C) (06/23 0033) Pulse Rate:  [84-99] 94 (06/23 0033) Resp:  [10-19] 16 (06/22 1535) BP: (116-147)/(72-102) 133/88 (06/23 0033) SpO2:  [97 %-100 %] 97 % (06/23 0033)  Physical Exam:  General: Alert and cooperative and appears to be in no acute distress.  Lying in bed comfortably HEENT: Difficult to assess JVD secondary to neck girth. Cardio: Normal S1 and S2, no S3 or S4. Rhythm is regular. No murmurs or rubs.   Pulm: Difficult to auscultate lung sounds secondary to body habitus.  No obvious wheezing/stridor.  Difficult to assess for rales.  Mild work of breathing on room air (normal per patient) Abdomen: Bowel  sounds normal. Abdomen soft and non-tender.  Extremities: Trace LE edema. Warm/ well perfused.  Strong radial pulse. Neuro: Cranial nerves grossly intact   Laboratory: Recent Labs  Lab 09/18/18 0207 09/19/18 0809 09/20/18 0757 09/21/18 0939 09/21/18 0946 09/21/18 0947  WBC 7.7 7.3 6.9  --    --   --   HGB 12.7* 13.1 13.0 13.3 13.6 13.9  HCT 37.5* 38.8* 38.8* 39.0 40.0 41.0  PLT 224 225 245  --   --   --    Recent Labs  Lab 09/17/18 1941  09/19/18 0809 09/20/18 0757  09/21/18 0946 09/21/18 0947 09/21/18 1421  NA 138   < > 136 136   < > 141 141 138  K 3.6   < > 3.8 3.5   < > 3.8 3.8 3.9  CL 107   < > 103 101  --   --   --  103  CO2 25   < > 25 27  --   --   --  29  BUN 13   < > 15 18  --   --   --  15  CREATININE 1.09   < > 1.14 1.33*  --   --   --  1.19  CALCIUM 8.7*   < > 9.1 9.1  --   --   --  8.9  PROT 6.6  --   --   --   --   --   --   --   BILITOT 0.7  --   --   --   --   --   --   --   ALKPHOS 68  --   --   --   --   --   --   --   ALT 78*  --   --   --   --   --   --   --   AST 48*  --   --   --   --   --   --   --   GLUCOSE 128*   < > 89 100*  --   --   --  89   < > = values in this interval not displayed.    Imaging/Diagnostic Tests: No results found.   Matilde Haymaker, MD 09/22/2018, 6:05 AM PGY-1, [facility] Intern pager: [phone], text pages welcome

## 2018-09-22 NOTE — Discharge Instructions (Signed)
You are admitted to the hospital for heart failure.  While you are here, you were found to have very significant heart failure and started on 4 medications.  He will be very important for you to continue taking his medications for your heart health.  Please follow-up with your scheduled cardiology appointment for continued care.  If you go home and notice worsened trouble breathing with increased lower extremity swelling that is an important reason to seek care quickly and cardiologist returning to the ED.  While you are here, you were also found to be HIV positive.  You were seen by an infectious disease doctor who has begun treatment with the appropriate antiviral medication.  Your girlfriend and baby daughter also recommended to be tested and to follow-up closely with infectious disease doctors for appropriate care.  This will be an issue for which she needed lifelong treatment.  With appropriate medication, it can be well controlled and lead to very few complications in your life.

## 2018-09-23 LAB — GLUCOSE 6 PHOSPHATE DEHYDROGENASE
G6PDH: 1.7 U/g{Hb} — ABNORMAL LOW (ref 4.6–13.5)
Hemoglobin: 13.7 g/dL (ref 13.0–17.7)

## 2018-09-23 LAB — HEPATITIS A ANTIBODY, TOTAL: hep A Total Ab: NEGATIVE

## 2018-09-23 LAB — HEPATITIS B SURFACE ANTIBODY, QUANTITATIVE: Hep B S AB Quant (Post): 32.7 m[IU]/mL (ref >9.9)

## 2018-09-23 LAB — RPR: RPR Ser Ql: NONREACTIVE

## 2018-09-24 LAB — QUANTIFERON-TB GOLD PLUS: QuantiFERON-TB Gold Plus: NEGATIVE

## 2018-09-24 LAB — QUANTIFERON-TB GOLD PLUS (RQFGPL)
QuantiFERON Mitogen Value: 3.11 IU/mL
QuantiFERON Nil Value: 0.06 IU/mL
QuantiFERON TB1 Ag Value: 0.05 IU/mL
QuantiFERON TB2 Ag Value: 0.05 IU/mL

## 2018-09-29 ENCOUNTER — Telehealth (HOSPITAL_COMMUNITY): Payer: Self-pay | Admitting: Adult Health

## 2018-09-29 NOTE — Telephone Encounter (Signed)
6/30 - Left v/m re: COVID Screening.  -[facility]

## 2018-09-29 NOTE — Telephone Encounter (Signed)
COVID-19 pre-appointment screening questions:   Do you have a history of COVID-19 or a positive test result in the past 7-10 days? NO  To the best of your knowledge, have you been in close contact with anyone with a confirmed diagnosis of COVID 19? NO  Have you had any one or more of the following: Fever, chills, cough, shortness of breath (out of the normal for you) or any flu-like symptoms? NO  Are you experiencing any of the following symptoms that is new or out of usual for you: NO   Ear, nose or throat discomfort  Sore throat  Headache  Muscle Pain  Diarrhea  Loss of taste or smell   Reviewed all the following with patient: YES  Use of hand sanitizer when entering the building  Everyone is required to wear a mask in the building, if you do not have a mask we are happy to provide you with one when you arrive  NO Visitor guidelines   If patient answers YES to any of questions they must change to a virtual visit and place note in comments about symptoms

## 2018-09-30 ENCOUNTER — Encounter (HOSPITAL_COMMUNITY): Payer: Self-pay

## 2018-09-30 ENCOUNTER — Ambulatory Visit (HOSPITAL_COMMUNITY)
Admit: 2018-09-30 | Discharge: 2018-09-30 | Disposition: A | Discharge status: Home / Self Care | Payer: BC Managed Care – PPO | Source: Ambulatory Visit | Attending: Cardiology | Admitting: Cardiology

## 2018-09-30 ENCOUNTER — Other Ambulatory Visit: Payer: Self-pay

## 2018-09-30 VITALS — BP 133/70 | HR 92 | Wt 382.6 lb

## 2018-09-30 DIAGNOSIS — I2510 Atherosclerotic heart disease of native coronary artery without angina pectoris: Secondary | ICD-10-CM

## 2018-09-30 DIAGNOSIS — F17210 Nicotine dependence, cigarettes, uncomplicated: Secondary | ICD-10-CM | POA: Diagnosis not present

## 2018-09-30 DIAGNOSIS — I110 Hypertensive heart disease with heart failure: Secondary | ICD-10-CM | POA: Diagnosis not present

## 2018-09-30 DIAGNOSIS — Z21 Asymptomatic human immunodeficiency virus [HIV] infection status: Secondary | ICD-10-CM | POA: Insufficient documentation

## 2018-09-30 DIAGNOSIS — Z8249 Family history of ischemic heart disease and other diseases of the circulatory system: Secondary | ICD-10-CM | POA: Diagnosis not present

## 2018-09-30 DIAGNOSIS — Z6841 Body Mass Index (BMI) 40.0 and over, adult: Secondary | ICD-10-CM | POA: Diagnosis not present

## 2018-09-30 DIAGNOSIS — R0683 Snoring: Secondary | ICD-10-CM | POA: Diagnosis not present

## 2018-09-30 DIAGNOSIS — I428 Other cardiomyopathies: Secondary | ICD-10-CM | POA: Insufficient documentation

## 2018-09-30 DIAGNOSIS — I420 Dilated cardiomyopathy: Secondary | ICD-10-CM

## 2018-09-30 DIAGNOSIS — Z833 Family history of diabetes mellitus: Secondary | ICD-10-CM | POA: Diagnosis not present

## 2018-09-30 DIAGNOSIS — J45909 Unspecified asthma, uncomplicated: Secondary | ICD-10-CM | POA: Insufficient documentation

## 2018-09-30 DIAGNOSIS — I10 Essential (primary) hypertension: Secondary | ICD-10-CM

## 2018-09-30 DIAGNOSIS — Z7982 Long term (current) use of aspirin: Secondary | ICD-10-CM | POA: Diagnosis not present

## 2018-09-30 DIAGNOSIS — Z79899 Other long term (current) drug therapy: Secondary | ICD-10-CM | POA: Insufficient documentation

## 2018-09-30 DIAGNOSIS — F1010 Alcohol abuse, uncomplicated: Secondary | ICD-10-CM | POA: Diagnosis not present

## 2018-09-30 DIAGNOSIS — I5022 Chronic systolic (congestive) heart failure: Secondary | ICD-10-CM | POA: Diagnosis not present

## 2018-09-30 LAB — COMPREHENSIVE METABOLIC PANEL
ALT: 39 U/L (ref 0–44)
AST: 31 U/L (ref 15–41)
Albumin: 3.9 g/dL (ref 3.5–5.0)
Alkaline Phosphatase: 64 U/L (ref 38–126)
Anion gap: 7 (ref 5–15)
BUN: 19 mg/dL (ref 6–20)
CO2: 24 mmol/L (ref 22–32)
Calcium: 9.3 mg/dL (ref 8.9–10.3)
Chloride: 106 mmol/L (ref 98–111)
Creatinine, Ser: 1.44 mg/dL — ABNORMAL HIGH (ref 0.61–1.24)
GFR calc Af Amer: >60 mL/min (ref >60)
GFR calc non Af Amer: >60 mL/min (ref >60)
Glucose, Bld: 102 mg/dL — ABNORMAL HIGH (ref 70–99)
Potassium: 4.3 mmol/L (ref 3.5–5.1)
Sodium: 137 mmol/L (ref 135–145)
Total Bilirubin: 1.2 mg/dL (ref 0.3–1.2)
Total Protein: 7.9 g/dL (ref 6.5–8.1)

## 2018-09-30 MED ORDER — SACUBITRIL-VALSARTAN 49-51 MG PO TABS: 1.0000 | ORAL_TABLET | Freq: Two times a day (BID) | ORAL | 3 refills | Status: DC

## 2018-09-30 NOTE — Patient Instructions (Addendum)
EKG was completed today.  Labs were done today. We will call you with any ABNORMAL results. No news is good news!  INCREASE Entresto to 49/51 mg (1 tab) twice a day.  Your provider has recommended that you have a home sleep study.  [employer] is the company that provides these and will send the equipment right to your home with instructions on how to set it up.  Once you have completed the test you just dispose of the equipment, the information is automatically uploaded to [place].  If your test was positive and you need a home CPAP machine you will be contacted by Dr Theodosia Blender office [facility]) to set this up.  Your physician recommends that you schedule a follow-up appointment in: 3 weeks.  At the [facility], you and your health needs are our priority. As part of our continuing mission to provide you with exceptional heart care, we have created designated Provider Care Teams. These Care Teams include your primary Cardiologist (physician) and Advanced Practice Providers (APPs- Physician Assistants and Nurse Practitioners) who all work together to provide you with the care you need, when you need it.   You may see any of the following providers on your designated Care Team at your next follow up: Marland Kitchen Dr Glori Bickers . Dr Loralie Champagne . Darrick Grinder, NP

## 2018-09-30 NOTE — Progress Notes (Signed)
ADVANCED HF CLINIC CONSULT NOTE  Referring Physician: Dr Tenny Crawoss Primary Care: none Primary Cardiologist: Dr Tenny Crawoss  HF MD: Dr Shirlee LatchMcLean  ID; Dr Drue SecondSnider  HPI:. [name] is a 28 year old referred to the HF clinic by Dr Tenny Crawoss for HF consultation.   [name] is 28 year old with asthma, CAD, HTN, MR, ETOH abuse, morbid obesity, HIV +, and chronic systolic heart failure. He is father died of CAD.   Admitted 09/17/2018 with increased SOB. ECHO was performed and showed severely reduced EF. Had LHC/RHC that showed NICM and single vessel disease. He was also diagnosed with HIV and started on biktarvy.   Today he presents as a new patient. SOB with steps but has improved. Denies PND. + Snoring. No dizziness. No chest pain. No falls. Weight at home 382. Says he has gained 100 pounds over the last few years. Denies chest pain. Works full time at [employer]. Taking all medications.   Cardiac Studies RHC/LHC 09/21/2018 1st Diagonal 70% RA 4 PCWP 21 CO 8.33/CI 3  ECHO 09/18/2018  Severe HK 15-20%, RV normal.  FH: Dad died CAD, Grandfather and Great Grandmother had CAD.   SH: Lives with girlfriend and new born baby. Works full time at [employer]. He does not smoke. Drinks 1/5 alcohol on the weekend. .   Review of Systems: [y] = yes, [ ]  = no   General: Weight gain [ ] ; Weight loss [ ] ; Anorexia [ ] ; Fatigue [ ] ; Fever [ ] ; Chills [ ] ; Weakness [ ]   Cardiac: Chest pain/pressure [ ] ; Resting SOB [ ] ; Exertional SOB [ Y]; Orthopnea [ ] ; Pedal Edema [ ] ; Palpitations [ ] ; Syncope [ ] ; Presyncope [ ] ; Paroxysmal nocturnal dyspnea[ ]   Pulmonary: Cough [ ] ; Wheezing[ ] ; Hemoptysis[ ] ; Sputum [ ] ; Snoring [ ]   GI: Vomiting[ ] ; Dysphagia[ ] ; Melena[ ] ; Hematochezia [ ] ; Heartburn[ ] ; Abdominal pain [ ] ; Constipation [ ] ; Diarrhea [ ] ; BRBPR [ ]   GU: Hematuria[ ] ; Dysuria [ ] ; Nocturia[ ]   Vascular: Pain in legs with walking [ ] ; Pain in feet with lying flat [ ] ; Non-healing sores [ ] ; Stroke [ ] ; TIA [  ]; Slurred speech [ ] ;  Neuro: Headaches[ ] ; Vertigo[ ] ; Seizures[ ] ; Paresthesias[ ] ;Blurred vision [ ] ; Diplopia [ ] ; Vision changes [ ]   Ortho/Skin: Arthritis [ ] ; Joint pain [Y ]; Muscle pain [ ] ; Joint swelling [ ] ; Back Pain [ ] ; Rash [ ]   Psych: Depression[ ] ; Anxiety[ ]   Heme: Bleeding problems [ ] ; Clotting disorders [ ] ; Anemia [ ]   Endocrine: Diabetes [ ] ; Thyroid dysfunction[ ]    Past Medical History:  Diagnosis Date  . Asthma   . Bronchitis     Current Outpatient Medications  Medication Sig Dispense Refill  . albuterol (PROVENTIL HFA;VENTOLIN HFA) 108 (90 Base) MCG/ACT inhaler Inhale 1-2 puffs into the lungs every 6 (six) hours as needed for wheezing or shortness of breath.    Marland Kitchen. aspirin 81 MG chewable tablet Chew 1 tablet (81 mg total) by mouth daily. 30 tablet 0  . bictegravir-emtricitabine-tenofovir AF (BIKTARVY) 50-200-25 MG TABS tablet Take 1 tablet by mouth daily. 30 tablet 5  . carvedilol (COREG) 6.25 MG tablet Take 1 tablet (6.25 mg total) by mouth 2 (two) times daily with a meal. 60 tablet 0  . furosemide (LASIX) 80 MG tablet Take 1 tablet (80 mg total) by mouth daily for 30 days. Take one TAB every morning. 30 tablet  0  . sacubitril-valsartan (ENTRESTO) 24-26 MG Take 1 tablet by mouth 2 (two) times daily. 60 tablet 0  . spironolactone (ALDACTONE) 25 MG tablet Take 1 tablet (25 mg total) by mouth daily. 30 tablet 0   No current facility-administered medications for this visit.     No Known Allergies    Social History   Socioeconomic History  . Marital status: Significant Other    Spouse name: Not on file  . Number of children: 1  . Years of education: 38  . Highest education level: Bachelor's degree (e.g., BA, AB, BS)  Occupational History  . Occupation: recovery specialist    Employer: [employer]  Social Needs  . Financial resource strain: Not hard at all  . Food insecurity    Worry: Never true    Inability: Never true  . Transportation needs     Medical: Not on file    Non-medical: Not on file  Tobacco Use  . Smoking status: Current Some Day Smoker    Types: Cigarettes  . Smokeless tobacco: Never Used  Substance and Sexual Activity  . Alcohol use: Yes    Alcohol/week: 3.0 standard drinks    Types: 3 Cans of beer per week    Frequency: Never    Comment: socially   . Drug use: Not Currently  . Sexual activity: Yes    Partners: Female  Lifestyle  . Physical activity    Days per week: 1 day    Minutes per session: 30 min  . Stress: Not at all  Relationships  . Social Musician on phone: Once a week    Gets together: Once a week    Attends religious service: Never    Active member of club or organization: Yes    Attends meetings of clubs or organizations: 1 to 4 times per year    Relationship status: Never married  . Intimate partner violence    Fear of current or ex partner: No    Emotionally abused: No    Physically abused: Not on file    Forced sexual activity: No  Other Topics Concern  . Not on file  Social History Narrative  . Not on file      Family History  Problem Relation Age of Onset  . Hypertension Mother   . Diabetes Mellitus II Father   . CAD Father        Died at 44 of 'CAD' while sleeping   . Obesity Sister     Vitals:   09/30/18 1021  BP: 133/70  Pulse: 92  SpO2: 99%  Weight: (!) 173.5 kg (382 lb 9.6 oz)   Wt Readings from Last 3 Encounters:  09/30/18 (!) 173.5 kg (382 lb 9.6 oz)  09/22/18 (!) 176 kg (388 lb 0.2 oz)  08/21/18 (!) 170.1 kg (375 lb)     PHYSICAL EXAM: General:  Morbidly obese.  No respiratory difficulty HEENT: normal Neck: supple. no JVD. Carotids 2+ bilat; no bruits. No lymphadenopathy or thryomegaly appreciated. Cor: PMI nondisplaced. Regular rate & rhythm. No rubs, gallops or murmurs. Lungs: clear Abdomen: obese, soft, nontender, nondistended. No hepatosplenomegaly. No bruits or masses. Good bowel sounds. Extremities: no cyanosis, clubbing, rash,  edema Neuro: alert & oriented x 3, cranial nerves grossly intact. moves all 4 extremities w/o difficulty. Affect pleasant.  ECG: NSR bpm QRS 98 ms   ASSESSMENT & PLAN: 1. Chronic Systolic HF ECHO 12/24/4626   EF 15-20% . Plan to repeat ECHO  in 3 months after HF med optimized.   Had 08/2018 RHC/LHC with NICM and single vessel disease 1st diagonal 70% .  - He has not had CMRI but I am not sure he would fit in the MRI scanner. Hold off for now.  NYHA IIIb. Volume status stable. Continue lasix 80 mg daily.  - Increase entresto 49-51 mg twice a day.  - Continue spironolactone 25 mg daily.  - Continue carvedilol 6.25 mg twice a day. He does have asthma but seems to be doing ok with carvedilol. If he does we can place on bisoprolol.  - Consider bidil down the road.  - Check BMET   2. CAD Single vessel disease - LHC 1st diagonal  70%.  - LFTs elevate during his hospitalization. Check LFTs today. Start atorvastatin 20 mg daily.   3. HTN  Improving.   4. Morbid Obesity  Body mass index is 54.9 kg/m.  Discussed portion control.   5.HIV +  Now followed by ID. On biktarvy.  6  ETOH abuse Discussed that he needs to avoid alcohol    7. Snoring Set up for home sleep study.   Greater than 50% of the (total minutes 45) visit spent in counseling/coordination of care regarding the above.     Follow up in 2-3 weeks then 6 weeks with Dr Aundra Dubin.   Adri Schloss NP-C  12:27 PM

## 2018-10-07 ENCOUNTER — Other Ambulatory Visit: Payer: Self-pay

## 2018-10-07 ENCOUNTER — Ambulatory Visit (INDEPENDENT_AMBULATORY_CARE_PROVIDER_SITE_OTHER): Payer: BC Managed Care – PPO | Admitting: Family Medicine

## 2018-10-07 ENCOUNTER — Encounter: Payer: Self-pay | Admitting: Family Medicine

## 2018-10-07 VITALS — BP 123/79 | HR 79 | Temp 98.2°F | Resp 18 | Ht 70.0 in | Wt 382.0 lb

## 2018-10-07 DIAGNOSIS — Z23 Encounter for immunization: Secondary | ICD-10-CM

## 2018-10-07 DIAGNOSIS — R609 Edema, unspecified: Secondary | ICD-10-CM

## 2018-10-07 DIAGNOSIS — I5041 Acute combined systolic (congestive) and diastolic (congestive) heart failure: Secondary | ICD-10-CM | POA: Diagnosis not present

## 2018-10-07 DIAGNOSIS — B20 Human immunodeficiency virus [HIV] disease: Secondary | ICD-10-CM | POA: Diagnosis not present

## 2018-10-07 DIAGNOSIS — J4520 Mild intermittent asthma, uncomplicated: Secondary | ICD-10-CM | POA: Diagnosis not present

## 2018-10-07 NOTE — Patient Instructions (Signed)
Health Maintenance, Male Adopting a healthy lifestyle and getting preventive care are important in promoting health and wellness. Ask your health care provider about:  The right schedule for you to have regular tests and exams.  Things you can do on your own to prevent diseases and keep yourself healthy. What should I know about diet, weight, and exercise? Eat a healthy diet   Eat a diet that includes plenty of vegetables, fruits, low-fat dairy products, and lean protein.  Do not eat a lot of foods that are high in solid fats, added sugars, or sodium. Maintain a healthy weight Body mass index (BMI) is a measurement that can be used to identify possible weight problems. It estimates body fat based on height and weight. Your health care provider can help determine your BMI and help you achieve or maintain a healthy weight. Get regular exercise Get regular exercise. This is one of the most important things you can do for your health. Most adults should:  Exercise for at least 150 minutes each week. The exercise should increase your heart rate and make you sweat (moderate-intensity exercise).  Do strengthening exercises at least twice a week. This is in addition to the moderate-intensity exercise.  Spend less time sitting. Even light physical activity can be beneficial. Watch cholesterol and blood lipids Have your blood tested for lipids and cholesterol at 28 years of age, then have this test every 5 years. You may need to have your cholesterol levels checked more often if:  Your lipid or cholesterol levels are high.  You are older than 28 years of age.  You are at high risk for heart disease. What should I know about cancer screening? Many types of cancers can be detected early and may often be prevented. Depending on your health history and family history, you may need to have cancer screening at various ages. This may include screening for:  Colorectal cancer.  Prostate cancer.   Skin cancer.  Lung cancer. What should I know about heart disease, diabetes, and high blood pressure? Blood pressure and heart disease  High blood pressure causes heart disease and increases the risk of stroke. This is more likely to develop in people who have high blood pressure readings, are of African descent, or are overweight.  Talk with your health care provider about your target blood pressure readings.  Have your blood pressure checked: ? Every 3-5 years if you are 79-12 years of age. ? Every year if you are 26 years old or older.  If you are between the ages of 57 and 39 and are a current or former smoker, ask your health care provider if you should have a one-time screening for abdominal aortic aneurysm (AAA). Diabetes Have regular diabetes screenings. This checks your fasting blood sugar level. Have the screening done:  Once every three years after age 15 if you are at a normal weight and have a low risk for diabetes.  More often and at a younger age if you are overweight or have a high risk for diabetes. What should I know about preventing infection? Hepatitis B If you have a higher risk for hepatitis B, you should be screened for this virus. Talk with your health care provider to find out if you are at risk for hepatitis B infection. Hepatitis C Blood testing is recommended for:  Everyone born from [birth year] through 1965.  Anyone with known risk factors for hepatitis C. Sexually transmitted infections (STIs)  You should be screened each year  for STIs, including gonorrhea and chlamydia, if: ? You are sexually active and are younger than 28 years of age. ? You are older than 28 years of age and your health care provider tells you that you are at risk for this type of infection. ? Your sexual activity has changed since you were last screened, and you are at increased risk for chlamydia or gonorrhea. Ask your health care provider if you are at risk.  Ask your health care  provider about whether you are at high risk for HIV. Your health care provider may recommend a prescription medicine to help prevent HIV infection. If you choose to take medicine to prevent HIV, you should first get tested for HIV. You should then be tested every 3 months for as long as you are taking the medicine. Follow these instructions at home: Lifestyle  Do not use any products that contain nicotine or tobacco, such as cigarettes, e-cigarettes, and chewing tobacco. If you need help quitting, ask your health care provider.  Do not use street drugs.  Do not share needles.  Ask your health care provider for help if you need support or information about quitting drugs. Alcohol use  Do not drink alcohol if your health care provider tells you not to drink.  If you drink alcohol: ? Limit how much you have to 0-2 drinks a day. ? Be aware of how much alcohol is in your drink. In the [place] one drink equals one 12 oz bottle of beer (355 mL), one 5 oz glass of wine (148 mL), or one 1 oz glass of hard liquor (44 mL). General instructions  Schedule regular health, dental, and eye exams.  Stay current with your vaccines.  Tell your health care provider if: ? You often feel depressed. ? You have ever been abused or do not feel safe at home. Summary  Adopting a healthy lifestyle and getting preventive care are important in promoting health and wellness.  Follow your health care provider's instructions about healthy diet, exercising, and getting tested or screened for diseases.  Follow your health care provider's instructions on monitoring your cholesterol and blood pressure. This information is not intended to replace advice given to you by your health care provider. Make sure you discuss any questions you have with your health care provider. Document Released: 09/14/2007 Document Revised: 03/11/2018 Document Reviewed: 03/11/2018 Elsevier Patient Education  2020 [employer]. Heart  Failure, Diagnosis  Heart failure means that your heart is not able to pump blood in the right way. This makes it hard for your body to work well. Heart failure is usually a long-term (chronic) condition. You must take good care of yourself and follow your treatment plan from your doctor. What are the causes? This condition may be caused by:  High blood pressure.  Build up of cholesterol and fat in the arteries.  Heart attack. This injures the heart muscle.  Heart valves that do not open and close properly.  Damage of the heart muscle. This is also called cardiomyopathy.  Lung disease.  Abnormal heart rhythms. What increases the risk? The risk of heart failure goes up as a person ages. This condition is also more likely to develop in people who:  Are overweight.  Are male.  Smoke or chew tobacco.  Abuse alcohol or illegal drugs.  Have taken medicines that can damage the heart.  Have diabetes.  Have abnormal heart rhythms.  Have thyroid problems.  Have low blood counts (anemia). What are the  signs or symptoms? Symptoms of this condition include:  Shortness of breath.  Coughing.  Swelling of the feet, ankles, legs, or belly.  Losing weight for no reason.  Trouble breathing.  Waking from sleep because of the need to sit up and get more air.  Rapid heartbeat.  Being very tired.  Feeling dizzy, or feeling like you may pass out (faint).  Having no desire to eat.  Feeling like you may vomit (nauseous).  Peeing (urinating) more at night.  Feeling confused. How is this treated?     This condition may be treated with:  Medicines. These can be given to treat blood pressure and to make the heart muscles stronger.  Changes in your daily life. These may include eating a healthy diet, staying at a healthy body weight, quitting tobacco and illegal drug use, or doing exercises.  Surgery. Surgery can be done to open blocked valves, or to put devices in the  heart, such as pacemakers.  A donor heart (heart transplant). You will receive a healthy heart from a donor. Follow these instructions at home:  Treat other conditions as told by your doctor. These may include high blood pressure, diabetes, thyroid disease, or abnormal heart rhythms.  Learn as much as you can about heart failure.  Get support as you need it.  Keep all follow-up visits as told by your doctor. This is important. Summary  Heart failure means that your heart is not able to pump blood in the right way.  This condition is caused by high blood pressure, heart attack, or damage of the heart muscle.  Symptoms of this condition include shortness of breath and swelling of the feet, ankles, legs, or belly. You may also feel very tired or feel like you may vomit.  You may be treated with medicines, surgery, or changes in your daily life.  Treat other health conditions as told by your doctor. This information is not intended to replace advice given to you by your health care provider. Make sure you discuss any questions you have with your health care provider. Document Released: 12/26/2007 Document Revised: 06/05/2018 Document Reviewed: 06/05/2018 Elsevier Patient Education  2020 [employer].

## 2018-10-07 NOTE — Progress Notes (Signed)
Patient [name], [place]    WUJ:[id number]  NFA:[id number]  DOB - [birth year]  SUBJECTIVE:   Todd Griffith, is a 28 y.o. male who presents to establish care with this clinic.   Current problems/concerns:  Patient hospitalized from 6/18-6/23/2020.  Patient noted to be HIV+, New heart failure, Possible OSA. He has appointments scheduled with specialist and is here to establish care.  Patient states that his weight at home was 368 this AM. He states that he has been checking his weight daily as instructed and has not noticed an increase. Patient reports compliance with all medications.  Patient denies side effects of medications.  No Known Allergies Past Medical History:  Diagnosis Date  . Asthma   . Bronchitis    Current Outpatient Medications on File Prior to Visit  Medication Sig Dispense Refill  . albuterol (PROVENTIL HFA;VENTOLIN HFA) 108 (90 Base) MCG/ACT inhaler Inhale 1-2 puffs into the lungs every 6 (six) hours as needed for wheezing or shortness of breath.    Marland Kitchen aspirin 81 MG chewable tablet Chew 1 tablet (81 mg total) by mouth daily. 30 tablet 0  . bictegravir-emtricitabine-tenofovir AF (BIKTARVY) 50-200-25 MG TABS tablet Take 1 tablet by mouth daily. 30 tablet 5  . carvedilol (COREG) 6.25 MG tablet Take 1 tablet (6.25 mg total) by mouth 2 (two) times daily with a meal. 60 tablet 0  . furosemide (LASIX) 80 MG tablet Take 1 tablet (80 mg total) by mouth daily for 30 days. Take one TAB every morning. 30 tablet 0  . sacubitril-valsartan (ENTRESTO) 49-51 MG Take 1 tablet by mouth 2 (two) times daily. 180 tablet 3  . spironolactone (ALDACTONE) 25 MG tablet Take 1 tablet (25 mg total) by mouth daily. 30 tablet 0   No current facility-administered medications on file prior to visit.    Family History  Problem Relation Age of Onset  . Hypertension Mother   . Diabetes Mellitus II Father   .  CAD Father        Died at 15 of 'CAD' while sleeping   . Obesity Sister    Social History   Socioeconomic History  . Marital status: Significant Other    Spouse name: Not on file  . Number of children: 1  . Years of education: 44  . Highest education level: Bachelor's degree (e.g., BA, AB, BS)  Occupational History  . Occupation: recovery specialist    Employer: [employer]  . Financial resource strain: Not hard at all  . Food insecurity    Worry: Never true    Inability: Never true  . Transportation needs    Medical: Not on file    Non-medical: Not on file  Tobacco Use  . Smoking status: Former Smoker    Types: Cigarettes    Quit date: 05/31/2018    Years since quitting: 0.3  . Smokeless tobacco: Never Used  Substance and Sexual Activity  . Alcohol use: Yes    Alcohol/week: 3.0 standard drinks    Types: 3 Cans of beer per week    Frequency: Never    Comment: socially   . Drug use: Not Currently  . Sexual activity: Yes    Partners: Female  Lifestyle  . Physical activity    Days per week: 1 day    Minutes per session: 30 min  . Stress: Not at all  Relationships  . Social connections  Talks on phone: Once a week    Gets together: Once a week    Attends religious service: Never    Active member of club or organization: Yes    Attends meetings of clubs or organizations: 1 to 4 times per year    Relationship status: Never married  . Intimate partner violence    Fear of current or ex partner: No    Emotionally abused: No    Physically abused: Not on file    Forced sexual activity: No  Other Topics Concern  . Not on file  Social History Narrative  . Not on file    Review of Systems  Constitutional: Negative.   HENT: Negative.   Eyes: Negative.   Respiratory: Negative.   Cardiovascular: Negative.   Gastrointestinal: Negative.   Genitourinary: Negative.   Musculoskeletal: Negative.   Skin: Negative.   Neurological: Negative.    Psychiatric/Behavioral: Negative.      OBJECTIVE:    BP 123/79 (BP Location: Left Arm, Patient Position: Sitting, Cuff Size: Large)   Pulse 79   Temp 98.2 F (36.8 C) (Oral)   Resp 18   Ht 5\' 10"  (1.778 m)   Wt (!) 382 lb (173.3 kg)   SpO2 100%   BMI 54.81 kg/m   Physical Exam  Constitutional: He is oriented to person, place, and time and well-developed, well-nourished, and in no distress. No distress.  HENT:  Head: Normocephalic and atraumatic.  Eyes: Pupils are equal, round, and reactive to light. Conjunctivae and EOM are normal.  Neck: Normal range of motion. Neck supple.  Cardiovascular: Normal rate, regular rhythm and intact distal pulses. Exam reveals no gallop and no friction rub.  No murmur heard. Pulmonary/Chest: Effort normal. No respiratory distress. He has wheezes (bilateral upper lobes. inspiratory).  Abdominal: Soft. Bowel sounds are normal. There is no abdominal tenderness.  Musculoskeletal: Normal range of motion.        General: Edema (mild non-pitting) present. No tenderness.  Lymphadenopathy:    He has no cervical adenopathy.  Neurological: He is alert and oriented to person, place, and time. Gait normal.  Skin: Skin is warm and dry.  Psychiatric: Mood, memory, affect and judgment normal.  Nursing note and vitals reviewed.    ASSESSMENT/PLAN:  1. HIV disease (HCC) Patient with appt to ID tomorrow. Continue with current medications.   2. Acute combined systolic and diastolic heart failure (HCC) No medication changes warranted at the present time.   3. Morbid obesity (HCC) The patient is asked to make an attempt to improve diet and exercise patterns to aid in medical management of this problem. We discussed diet, portion control and walking.   4. Mild intermittent asthma without complication  5. Peripheral edema Resolving.    Return in about 3 months (around 01/07/2019) for General Follow up.  The patient was given clear instructions to go to  ER or return to medical center if symptoms don't improve, worsen or new problems develop. The patient verbalized understanding. The patient was told to call to get lab results if they haven't heard anything in the next week.     This note has been created with Education officer, environmental. Any transcriptional errors are unintentional.   Ms. Andr L. Riley Lam, FNP-BC [facility] [address] [place], [place] [address] [phone]

## 2018-10-08 ENCOUNTER — Ambulatory Visit: Payer: BC Managed Care – PPO | Admitting: Family

## 2018-10-08 ENCOUNTER — Telehealth: Payer: Self-pay | Admitting: Family

## 2018-10-08 NOTE — Telephone Encounter (Signed)
COVID-19 Pre-Screening Questions:10/08/18 ° °Do you currently have a fever (>100 °F), chills or unexplained body aches?NO ° °Are you currently experiencing new cough, shortness of breath, sore throat, runny nose? NO  °•  °Have you recently travelled outside the state of [place] in the last 14 days? NO °•  °Have you been in contact with someone that is currently pending confirmation of Covid19 testing or has been confirmed to have the Covid19 virus?  NO °**If the patient answers NO to ALL questions -  advise the patient to please call the clinic before coming to the office should any symptoms develop.  ° ° °

## 2018-10-09 LAB — HLA B*5701

## 2018-10-10 ENCOUNTER — Encounter (INDEPENDENT_AMBULATORY_CARE_PROVIDER_SITE_OTHER): Payer: BC Managed Care – PPO | Admitting: Cardiology

## 2018-10-10 DIAGNOSIS — G4733 Obstructive sleep apnea (adult) (pediatric): Secondary | ICD-10-CM | POA: Diagnosis not present

## 2018-10-11 ENCOUNTER — Ambulatory Visit: Payer: BC Managed Care – PPO

## 2018-10-11 NOTE — Procedures (Signed)
     Patient Information First Name: Todd Griffith Last Name: Calender ID: [id number] Birth Date: Sep 15, 1990  Age: 28  Gender: Male  Sleep Study Information Study Date: 10/10/2018 Referring Physician:  Loralie Champagne, MD  TEST DESCRIPTION: Home sleep apnea testing was completed using the WatchPat, a Type 1 device, utilizing peripheral arterial tonometry (PAT), chest movement, actigraphy, pulse oximetry, pulse rate, body position and snore. AHI was calculated with apnea and hypopnea using valid sleep time as the denominator. RDI includes apneas, hypopneas, and RERAs. The data acquired and the scoring of sleep and all associated events were performed in accordance with the recommended standards and specifications as outlined in the AASM Manual for the Scoring of Sleep and Associated Events 2.2.0 (2015).  DIAGNOSIS: 1. Severe Obstructive Sleep Apnea (G47.33) with AHI 32.9/hr. 2. No significant Central Sleep Apnea with pAHIc 1.6/hr. 3. Nocturnal Hypoxemia with lowest O2 sat 68%. Time spent with O2 sats<88% was 41.6 minutes. 4. Moderate snoring was noted. 5. Sleep fragmentation with increased frequency of arousals. Normal sleep latency and shortened REM sleep latency.  Recommendations 1. Recommend continuous positive airway pressure (CPAP) as the initial treatment of choice for severe obstructive sleep apnea. Recommend Auto PAP with pressure range 5-20cm H2O with download and PAP interface (mask) fitted for patient comfort, heated humidification and PAP compliance monitoring (1 month, 3 months & 12 months after PAP Initiation).  2. Positive airway pressure therapy (PAP) is the first line of treatment for patients with severe OSA. Alternative treatment for severe OSA in patients who cannot tolerate and have failed or refused CPAP therapy includes :   a. The patient may benefit from the use of nocturnal mandibular repositioning appliance. If that line of therapy is to be pursed, the patient should  be evaluated by a dentist trained in the treatment of sleep related breathing disorders.   b. An ENT consultation which may be useful to look for specific causes of obstruction and possible treatment Options.   c. If patient is intolerant to PAP therapy, consider referral to ENT for evaluation for hypoglossal nerve stimulator.  3. Weight loss may be of benefit in reducing the severity of respiratory events and snoring .  4. Routine follow-up efficacy testing should be performed.  Report prepared by: Fransico Him, MD 10/11/2018 Electronically Sig

## 2018-10-12 ENCOUNTER — Encounter: Payer: Self-pay | Admitting: Family

## 2018-10-12 ENCOUNTER — Ambulatory Visit (INDEPENDENT_AMBULATORY_CARE_PROVIDER_SITE_OTHER): Payer: BC Managed Care – PPO | Admitting: Family

## 2018-10-12 ENCOUNTER — Other Ambulatory Visit: Payer: Self-pay

## 2018-10-12 VITALS — BP 142/87 | HR 86 | Temp 98.2°F | Wt 378.0 lb

## 2018-10-12 DIAGNOSIS — I420 Dilated cardiomyopathy: Secondary | ICD-10-CM

## 2018-10-12 DIAGNOSIS — B20 Human immunodeficiency virus [HIV] disease: Secondary | ICD-10-CM | POA: Diagnosis not present

## 2018-10-12 MED ORDER — BIKTARVY 50-200-25 MG PO TABS: 1.0000 | ORAL_TABLET | Freq: Every day | ORAL | 5 refills | Status: DC

## 2018-10-12 NOTE — Patient Instructions (Signed)
Nice to see you!  Please continue to take your Garnet as prescribed.  Medication has been sent to the pharmacy.  We will check your blood work today and get you the results.   Plan for follow up in 6 weeks or sooner if needed with lab work 1-2 weeks prior to appointment.

## 2018-10-12 NOTE — Progress Notes (Signed)
Introduced myself to patient and explained our pharmacy services at the clinic. He is tolerating Biktarvy well so far. He developed nausea when he first started taking it but has since started taking it with a meal and the nausea has subsided. He hasn't missed any doses. Gave him my card and told him to call me with any issues/questions/concerns.

## 2018-10-12 NOTE — Progress Notes (Signed)
Subjective:    Patient ID: Todd Griffith, male    DOB: [birth year], 28 y.o.   MRN: [id number]  Chief Complaint  Patient presents with  . HIV Positive/AIDS    HPI:  Todd Griffith is a 28 y.o. male with previous medical history of asthma and morbid obesity recently admitted to the hospital with several months of worsening dyspnea and orthopnea found to have cardiomyopathy and heart failure with EF of 15 to 20% with severe hypokinesis of the left ventricular and entire anterior and anteroseptal wall.  Routine admission screening found to be positive for HIV with confirmatory HIV-1.  Risk factors for acquiring HIV include heterosexual contact.  CD4 nadir of 739.  Pathogenesis, transmission, prevention, and treatments for HIV were discussed while in the hospital.  He was started on Biktarvy at discharge. His girlfriend tested negative and he has a newborn.   Todd Griffith continues to take his Biktarvy as prescribed with good adherence and tolerance to the regimen following 1 month of treatment.  Initially experienced nausea when taking medication which was improved when taking it with food.  Feeling well today and improved since leaving the hospital with no concerns. Denies fevers, chills, night sweats, headaches, changes in vision, neck pain/stiffness, nausea, diarrhea, vomiting, lesions or rashes.  Todd Griffith continues to have coverage through [facility] and has no problems obtaining medication from the pharmacy.  He has a good support system around him including his girlfriend.  He is working on adapting to life with HIV as well as heart failure.  He is back to work full-time.  No recreational/illicit drug use or tobacco use.  Occasionally consumes red wine.  No recent feelings of being down, depressed, or hopeless.  He is not currently sexually active.  He continues to follow-up with cardiology for heart failure.  No Known Allergies    Outpatient Medications Prior to  Visit  Medication Sig Dispense Refill  . albuterol (PROVENTIL HFA;VENTOLIN HFA) 108 (90 Base) MCG/ACT inhaler Inhale 1-2 puffs into the lungs every 6 (six) hours as needed for wheezing or shortness of breath.    Marland Kitchen aspirin 81 MG chewable tablet Chew 1 tablet (81 mg total) by mouth daily. 30 tablet 0  . carvedilol (COREG) 6.25 MG tablet Take 1 tablet (6.25 mg total) by mouth 2 (two) times daily with a meal. 60 tablet 0  . furosemide (LASIX) 80 MG tablet Take 1 tablet (80 mg total) by mouth daily for 30 days. Take one TAB every morning. 30 tablet 0  . sacubitril-valsartan (ENTRESTO) 49-51 MG Take 1 tablet by mouth 2 (two) times daily. 180 tablet 3  . spironolactone (ALDACTONE) 25 MG tablet Take 1 tablet (25 mg total) by mouth daily. 30 tablet 0  . bictegravir-emtricitabine-tenofovir AF (BIKTARVY) 50-200-25 MG TABS tablet Take 1 tablet by mouth daily. 30 tablet 5   No facility-administered medications prior to visit.      Past Medical History:  Diagnosis Date  . Asthma   . Bronchitis       Past Surgical History:  Procedure Laterality Date  . RIGHT/LEFT HEART CATH AND CORONARY ANGIOGRAPHY N/A 09/21/2018   Procedure: RIGHT/LEFT HEART CATH AND CORONARY ANGIOGRAPHY;  Surgeon: Leonie Man, MD;  Location: [place] [facility];  Service: Cardiovascular;  Laterality: N/A;      Family History  Problem Relation Age of Onset  . Hypertension Mother   . Diabetes Mellitus II Father   . CAD Father  Died at 2 of 'CAD' while sleeping   . Obesity Sister     Social History   Socioeconomic History  . Marital status: Significant Other    Spouse name: Not on file  . Number of children: 1  . Years of education: 29  . Highest education level: Bachelor's degree (e.g., BA, AB, BS)  Occupational History  . Occupation: recovery specialist    Employer: [employer]  . Financial resource strain: Not hard at all  . Food insecurity    Worry: Never true    Inability: Never  true  . Transportation needs    Medical: Not on file    Non-medical: Not on file  Tobacco Use  . Smoking status: Former Smoker    Types: Cigarettes    Quit date: 05/31/2018    Years since quitting: 0.3  . Smokeless tobacco: Never Used  Substance and Sexual Activity  . Alcohol use: Yes    Alcohol/week: 3.0 standard drinks    Types: 3 Cans of beer per week    Frequency: Never    Comment: socially   . Drug use: Not Currently  . Sexual activity: Yes    Partners: Female  Lifestyle  . Physical activity    Days per week: 1 day    Minutes per session: 30 min  . Stress: Not at all  Relationships  . Social Herbalist on phone: Once a week    Gets together: Once a week    Attends religious service: Never    Active member of club or organization: Yes    Attends meetings of clubs or organizations: 1 to 4 times per year    Relationship status: Never married  . Intimate partner violence    Fear of current or ex partner: No    Emotionally abused: No    Physically abused: Not on file    Forced sexual activity: No  Other Topics Concern  . Not on file  Social History Narrative  . Not on file     Review of Systems  Constitutional: Negative for appetite change, chills, fatigue, fever and unexpected weight change.  Eyes: Negative for visual disturbance.  Respiratory: Negative for cough, chest tightness, shortness of breath and wheezing.   Cardiovascular: Negative for chest pain and leg swelling.  Gastrointestinal: Negative for abdominal pain, constipation, diarrhea, nausea and vomiting.  Genitourinary: Negative for dysuria, flank pain, frequency, genital sores, hematuria and urgency.  Skin: Negative for rash.  Allergic/Immunologic: Negative for immunocompromised state.  Neurological: Negative for dizziness and headaches.       Objective:    BP (!) 142/87   Pulse 86   Temp 98.2 F (36.8 C)   Wt (!) 378 lb (171.5 kg)   BMI 54.24 kg/m  Nursing note and vital signs  reviewed.  Physical Exam Constitutional:      General: He is not in acute distress.    Appearance: He is well-developed. He is obese. He is not ill-appearing.  Eyes:     Conjunctiva/sclera: Conjunctivae normal.  Neck:     Musculoskeletal: Neck supple.  Cardiovascular:     Rate and Rhythm: Normal rate and regular rhythm.     Heart sounds: Normal heart sounds. No murmur. No friction rub. No gallop.   Pulmonary:     Effort: Pulmonary effort is normal. No respiratory distress.     Breath sounds: Normal breath sounds. No wheezing or rales.  Chest:     Chest  wall: No tenderness.  Abdominal:     General: Bowel sounds are normal.     Palpations: Abdomen is soft.     Tenderness: There is no abdominal tenderness.  Lymphadenopathy:     Cervical: No cervical adenopathy.  Skin:    General: Skin is warm and dry.     Findings: No rash.  Neurological:     Mental Status: He is alert and oriented to person, place, and time.  Psychiatric:        Behavior: Behavior normal.        Thought Content: Thought content normal.        Judgment: Judgment normal.         Assessment & Plan:   Problem List Items Addressed This Visit      Cardiovascular and Mediastinum   Dilated cardiomyopathy ([place])    Stable with no significant shortness of breath or symptoms of exacerbation at present.  Continue management per heart failure team.        Other   Morbid obesity ([place])    Discussed importance of continued weight loss to supplement his heart failure treatments as well as improve his overall health.  Continue to monitor.      HIV disease ([place]) - Primary    Mr. Gilliand appears to be doing well with good adherence and tolerance to his ART regimen of Biktarvy.  Initial nausea resolved.  No signs/symptoms of opportunistic infection or progressive HIV disease.  He has no problems obtaining his medication from the pharmacy.  Discussed the basics of HIV as well as treatments.  Clinic introduction provided.   Check blood work today.  Continue current dose of Biktarvy.  Plan for follow-up in 6 weeks or sooner if needed with lab work 1 to 2 weeks prior to appointment.      Relevant Medications   bictegravir-emtricitabine-tenofovir AF (BIKTARVY) 50-200-25 MG TABS tablet   Other Relevant Orders   HIV-1 RNA quant-no reflex-bld   COMPLETE METABOLIC PANEL WITH GFR   T-helper cell (CD4)- ([facility] clinic only)   HLA B*[id number]   HIV-1 RNA quant-no reflex-bld   Comprehensive metabolic panel   T-helper cell (CD4)- ([facility] clinic only)   RPR       I am having Ruven M. Griffith maintain his albuterol, aspirin, carvedilol, furosemide, spironolactone, sacubitril-valsartan, and Biktarvy.   Meds ordered this encounter  Medications  . bictegravir-emtricitabine-tenofovir AF (BIKTARVY) 50-200-25 MG TABS tablet    Sig: Take 1 tablet by mouth daily.    Dispense:  30 tablet    Refill:  5    [place] card: [id number] PCN: ACCESS [id number] ID: [id number]    Order Specific Question:   Supervising Provider    Answer:   Carlyle Basques [[id number]]     Follow-up: Return in about 6 weeks (around 11/23/2018), or if symptoms worsen or fail to improve.    Terri Piedra, MSN, FNP-C Nurse Practitioner [facility] Office phone: [phone] Pager: [place] number: [phone]

## 2018-10-12 NOTE — Assessment & Plan Note (Signed)
Todd Griffith appears to be doing well with good adherence and tolerance to his ART regimen of Biktarvy.  Initial nausea resolved.  No signs/symptoms of opportunistic infection or progressive HIV disease.  He has no problems obtaining his medication from the pharmacy.  Discussed the basics of HIV as well as treatments.  Clinic introduction provided.  Check blood work today.  Continue current dose of Biktarvy.  Plan for follow-up in 6 weeks or sooner if needed with lab work 1 to 2 weeks prior to appointment.

## 2018-10-12 NOTE — Assessment & Plan Note (Signed)
Stable with no significant shortness of breath or symptoms of exacerbation at present.  Continue management per heart failure team.

## 2018-10-12 NOTE — Assessment & Plan Note (Signed)
Discussed importance of continued weight loss to supplement his heart failure treatments as well as improve his overall health.  Continue to monitor.

## 2018-10-13 ENCOUNTER — Telehealth: Payer: Self-pay | Admitting: *Deleted

## 2018-10-13 LAB — T-HELPER CELL (CD4) - (RCID CLINIC ONLY)
CD4 % Helper T Cell: 35 % (ref 33–65)
CD4 T Cell Abs: 1089 /uL (ref 400–1790)

## 2018-10-13 NOTE — Telephone Encounter (Signed)
Informed patient of sleep study results and patient understanding was verbalized. Patient understands his sleep study showed they have sleep apnea and recommend auto CPAP titration through [facility].  Followup with me in 10 weeks.  Upon patient request DME selection is [facility]. Patient understands he will be contacted by [facility] to set up his cpap. Patient understands to call if [facility] does not contact him with new setup in a timely manner. Patient understands they will be called once confirmation has been received from [facility] that they have received their new machine to schedule 10 week follow up appointment.  [facility] notified of new cpap order  Please add to airview Patient was grateful for the call and thanked me.

## 2018-10-13 NOTE — Telephone Encounter (Signed)
-----   Message from Sueanne Margarita, MD sent at 10/11/2018  5:17 PM EDT ----- Please let patient know that they have sleep apnea and recommend auto CPAP titration through [facility].  Orders are in [place].  Followup with me in 10 weeks.

## 2018-10-19 ENCOUNTER — Other Ambulatory Visit: Payer: Self-pay

## 2018-10-20 ENCOUNTER — Telehealth (HOSPITAL_COMMUNITY): Payer: Self-pay

## 2018-10-20 NOTE — Telephone Encounter (Signed)
Felipa Eth called regarding letter of accomodation given by clinic to pt stating to not work >8 hrs and have those hours within 1st shift. Miller requested time frame for condition, explained it was chronic and that any shift (830-530, 9-6, or 10-7p) that was considered 1st shift would suffice.

## 2018-10-21 LAB — COMPLETE METABOLIC PANEL WITH GFR
AG Ratio: 1.1 (calc) (ref 1.0–2.5)
ALT: 31 U/L (ref 9–46)
AST: 26 U/L (ref 10–40)
Albumin: 4.1 g/dL (ref 3.6–5.1)
Alkaline phosphatase (APISO): 68 U/L (ref 36–130)
BUN/Creatinine Ratio: 10 (calc) (ref 6–22)
BUN: 16 mg/dL (ref 7–25)
CO2: 29 mmol/L (ref 20–32)
Calcium: 9.5 mg/dL (ref 8.6–10.3)
Chloride: 101 mmol/L (ref 98–110)
Creat: 1.59 mg/dL — ABNORMAL HIGH (ref 0.60–1.35)
GFR, Est African American: 67 mL/min/{1.73_m2} (ref >=60)
GFR, Est Non African American: 58 mL/min/{1.73_m2} — ABNORMAL LOW (ref >=60)
Globulin: 3.7 g/dL (calc) (ref 1.9–3.7)
Glucose, Bld: 88 mg/dL (ref 65–99)
Potassium: 4.3 mmol/L (ref 3.5–5.3)
Sodium: 136 mmol/L (ref 135–146)
Total Bilirubin: 0.9 mg/dL (ref 0.2–1.2)
Total Protein: 7.8 g/dL (ref 6.1–8.1)

## 2018-10-21 LAB — HLA B*5701: HLA-B*5701 w/rflx HLA-B High: NEGATIVE

## 2018-10-21 LAB — HIV-1 RNA QUANT-NO REFLEX-BLD
HIV 1 RNA Quant: <20 copies/mL — AB
HIV-1 RNA Quant, Log: <1.3 Log copies/mL — AB

## 2018-10-23 ENCOUNTER — Other Ambulatory Visit: Payer: Self-pay

## 2018-10-23 ENCOUNTER — Encounter (HOSPITAL_COMMUNITY): Payer: Self-pay

## 2018-10-23 ENCOUNTER — Ambulatory Visit (HOSPITAL_COMMUNITY)
Admission: RE | Admit: 2018-10-23 | Discharge: 2018-10-23 | Disposition: A | Discharge status: Home / Self Care | Payer: BC Managed Care – PPO | Source: Ambulatory Visit | Attending: Internal Medicine | Admitting: Internal Medicine

## 2018-10-23 VITALS — BP 112/72 | HR 98 | Wt 378.0 lb

## 2018-10-23 DIAGNOSIS — Z8249 Family history of ischemic heart disease and other diseases of the circulatory system: Secondary | ICD-10-CM | POA: Insufficient documentation

## 2018-10-23 DIAGNOSIS — Z21 Asymptomatic human immunodeficiency virus [HIV] infection status: Secondary | ICD-10-CM | POA: Diagnosis not present

## 2018-10-23 DIAGNOSIS — I428 Other cardiomyopathies: Secondary | ICD-10-CM | POA: Insufficient documentation

## 2018-10-23 DIAGNOSIS — F1010 Alcohol abuse, uncomplicated: Secondary | ICD-10-CM | POA: Insufficient documentation

## 2018-10-23 DIAGNOSIS — Z87891 Personal history of nicotine dependence: Secondary | ICD-10-CM | POA: Insufficient documentation

## 2018-10-23 DIAGNOSIS — J45909 Unspecified asthma, uncomplicated: Secondary | ICD-10-CM | POA: Insufficient documentation

## 2018-10-23 DIAGNOSIS — I420 Dilated cardiomyopathy: Secondary | ICD-10-CM | POA: Diagnosis not present

## 2018-10-23 DIAGNOSIS — I2510 Atherosclerotic heart disease of native coronary artery without angina pectoris: Secondary | ICD-10-CM | POA: Insufficient documentation

## 2018-10-23 DIAGNOSIS — Z6841 Body Mass Index (BMI) 40.0 and over, adult: Secondary | ICD-10-CM | POA: Diagnosis not present

## 2018-10-23 DIAGNOSIS — G4730 Sleep apnea, unspecified: Secondary | ICD-10-CM | POA: Diagnosis not present

## 2018-10-23 DIAGNOSIS — Z79899 Other long term (current) drug therapy: Secondary | ICD-10-CM | POA: Insufficient documentation

## 2018-10-23 DIAGNOSIS — G4733 Obstructive sleep apnea (adult) (pediatric): Secondary | ICD-10-CM | POA: Diagnosis not present

## 2018-10-23 DIAGNOSIS — Z7982 Long term (current) use of aspirin: Secondary | ICD-10-CM | POA: Diagnosis not present

## 2018-10-23 DIAGNOSIS — I5022 Chronic systolic (congestive) heart failure: Secondary | ICD-10-CM

## 2018-10-23 DIAGNOSIS — I110 Hypertensive heart disease with heart failure: Secondary | ICD-10-CM | POA: Diagnosis not present

## 2018-10-23 MED ORDER — SPIRONOLACTONE 25 MG PO TABS: 25.0000 mg | ORAL_TABLET | Freq: Every day | ORAL | 3 refills | Status: DC

## 2018-10-23 MED ORDER — ASPIRIN 81 MG PO CHEW: 81.0000 mg | CHEWABLE_TABLET | Freq: Every day | ORAL | 3 refills | Status: DC

## 2018-10-23 MED ORDER — ATORVASTATIN CALCIUM 20 MG PO TABS: 20.0000 mg | ORAL_TABLET | Freq: Every day | ORAL | 3 refills | Status: DC

## 2018-10-23 MED ORDER — FUROSEMIDE 80 MG PO TABS: 80.0000 mg | ORAL_TABLET | Freq: Every day | ORAL | 3 refills | Status: DC

## 2018-10-23 MED ORDER — BISOPROLOL FUMARATE 5 MG PO TABS: 2.5000 mg | ORAL_TABLET | Freq: Every day | ORAL | 3 refills | Status: DC

## 2018-10-23 NOTE — Patient Instructions (Signed)
STOP Carvedilol   REFILLED Aspirin, Furosemide, Spironolactone  START Atorvastatin 20mg . One tab daily.  START Bisoprolol 2.5mg . 0.5 tab daily  Please follow up with the [facility] in 4 weeks.  At the [facility], you and your health needs are our priority. As part of our continuing mission to provide you with exceptional heart care, we have created designated Provider Care Teams. These Care Teams include your primary Cardiologist (physician) and Advanced Practice Providers (APPs- Physician Assistants and Nurse Practitioners) who all work together to provide you with the care you need, when you need it.   You may see any of the following providers on your designated Care Team at your next follow up: Marland Kitchen Dr Glori Bickers . Dr Loralie Champagne . Darrick Grinder, NP

## 2018-10-23 NOTE — Progress Notes (Signed)
ADVANCED HF CLINIC NOTE  Referring Physician: Dr Harrington Challenger Primary Care:  Primary Cardiologist: Dr Harrington Challenger  HF MD: Dr Aundra Dubin  ID; Dr Baxter Flattery  HPI:. Todd Griffith is a 28 year old referred to the HF clinic by Dr Harrington Challenger for HF consultation.   Todd Griffith is 28 year old with asthma, CAD, HTN, Todd, ETOH abuse, morbid obesity, HIV +, and chronic systolic heart failure. He is father died of CAD.   Admitted 10/07/2018 with increased SOB. ECHO was performed and showed severely reduced EF. Had LHC/RHC that showed NICM and single vessel disease. He was also diagnosed with HIV and started on biktarvy.   Today he returns for HF follow up. Overall feeling fine. Last visit entresto was increased to 49-51 mg twice a day. Had some wheezing last night. SOB of steps. Denies PND/Orthopnea. Appetite ok. No fever or chills. Weight at home 370 pounds. Not walking much. No chest pain. Taking all medications. Working from home. Drinking a glass of wine 1-2 times week. Lives with his girlfriend and newborn.   Cardiac Studies RHC/LHC 09/21/2018 1st Diagonal 70% RA 4 PCWP 21 CO 8.33/CI 3  ECHO 09/18/2018  Severe HK 15-20%, RV normal.  FH: Dad died CAD, Grandfather and Great Grandmother had CAD.   SH: Lives with girlfriend and new born baby. Works full time at [employer]. He does not smoke. Drinks 1/5 alcohol on the weekend. .    Past Medical History:  Diagnosis Date  . Asthma   . Bronchitis     Current Outpatient Medications  Medication Sig Dispense Refill  . albuterol (PROVENTIL HFA;VENTOLIN HFA) 108 (90 Base) MCG/ACT inhaler Inhale 1-2 puffs into the lungs every 6 (six) hours as needed for wheezing or shortness of breath.    Marland Kitchen aspirin 81 MG chewable tablet Chew 1 tablet (81 mg total) by mouth daily. 30 tablet 0  . bictegravir-emtricitabine-tenofovir AF (BIKTARVY) 50-200-25 MG TABS tablet Take 1 tablet by mouth daily. 30 tablet 5  . carvedilol (COREG) 6.25 MG tablet Take 1 tablet (6.25 mg total) by mouth 2 (two)  times daily with a meal. 60 tablet 0  . furosemide (LASIX) 80 MG tablet Take 1 tablet (80 mg total) by mouth daily for 30 days. Take one TAB every morning. 30 tablet 0  . sacubitril-valsartan (ENTRESTO) 49-51 MG Take 1 tablet by mouth 2 (two) times daily. 180 tablet 3  . spironolactone (ALDACTONE) 25 MG tablet Take 1 tablet (25 mg total) by mouth daily. 30 tablet 0   No current facility-administered medications for this encounter.     No Known Allergies    Social History   Socioeconomic History  . Marital status: Significant Other    Spouse name: Not on file  . Number of children: 1  . Years of education: 65  . Highest education level: Bachelor's degree (e.g., BA, AB, BS)  Occupational History  . Occupation: recovery specialist    Employer: [employer]  . Financial resource strain: Not hard at all  . Food insecurity    Worry: Never true    Inability: Never true  . Transportation needs    Medical: Not on file    Non-medical: Not on file  Tobacco Use  . Smoking status: Former Smoker    Types: Cigarettes    Quit date: 05/31/2018    Years since quitting: 0.3  . Smokeless tobacco: Never Used  Substance and Sexual Activity  . Alcohol use: Yes    Alcohol/week:  3.0 standard drinks    Types: 3 Cans of beer per week    Frequency: Never    Comment: socially   . Drug use: Not Currently  . Sexual activity: Yes    Partners: Female  Lifestyle  . Physical activity    Days per week: 1 day    Minutes per session: 30 min  . Stress: Not at all  Relationships  . Social Musicianconnections    Talks on phone: Once a week    Gets together: Once a week    Attends religious service: Never    Active member of club or organization: Yes    Attends meetings of clubs or organizations: 1 to 4 times per year    Relationship status: Never married  . Intimate partner violence    Fear of current or ex partner: No    Emotionally abused: No    Physically abused: Not on file    Forced  sexual activity: No  Other Topics Concern  . Not on file  Social History Narrative  . Not on file      Family History  Problem Relation Age of Onset  . Hypertension Mother   . Diabetes Mellitus II Father   . CAD Father        Died at 6943 of 'CAD' while sleeping   . Obesity Sister     Vitals:   10/23/18 1113  BP: 112/72  Pulse: 98  SpO2: 97%  Weight: (!) 171.5 kg (378 lb)   Wt Readings from Last 3 Encounters:  10/23/18 (!) 171.5 kg (378 lb)  10/12/18 (!) 171.5 kg (378 lb)  10/07/18 (!) 173.3 kg (382 lb)     PHYSICAL EXAM: General:  Well appearing. No resp difficulty HEENT: normal Neck: supple. no JVD. Carotids 2+ bilat; no bruits. No lymphadenopathy or thryomegaly appreciated. Cor: PMI nondisplaced. Regular rate & rhythm. No rubs, gallops or murmurs. Lungs: clear Abdomen: obese, soft, nontender, nondistended. No hepatosplenomegaly. No bruits or masses. Good bowel sounds. Extremities: no cyanosis, clubbing, rash, edema Neuro: alert & orientedx3, cranial nerves grossly intact. moves all 4 extremities w/o difficulty. Affect pleasant     ASSESSMENT & PLAN: 1. Chronic Systolic HF ECHO 4/09/81196/19/2020   EF 15-20% . Plan to repeat ECHO in 3 months after HF med optimized.   Had 08/2018 RHC/LHC with NICM and single vessel disease 1st diagonal 70% .  - He has not had CMRI but I am not sure he would fit in the MRI scanner. Hold off for now.  NYHA IIIb. Volume status stable. Continue lasix 80 mg daily.  - Continue entresto 49-51 mg twice a day.  - Continue spironolactone 25 mg daily.  - Has some wheezing last night. Stop carvedilol and 2.5 mg bisoprolol daily.  - Consider bidil down the road.   2. CAD Single vessel disease - LHC 1st diagonal  70%.  - LFTs elevate during his hospitalization.  -LFTs normalized 7/13.  -Continue 81 mg aspirin daily.  - Start atorvastatin 20 mg daily.   3. HTN  Improving.   4. Morbid Obesity  Body mass index is 54.24 kg/m.  Discussed portion  control. Needs to lose weight.  5.HIV +  Now followed by ID. On biktarvy.  6  ETOH abuse Discussed that he needs to avoid alcohol     7. Severe Sleep Apnea.  [place] Sleep study showed --> AHI 32.9 nocturna hypoxia. Needs to start CPAP ASAP.    Follow up in 4 week for medications  titration. Follow up in October with Dr Shirlee Latch with an ECHO.     Rether Rison NP-C  11:25 AM

## 2018-10-27 LAB — HIV-1 RNA, PCR (GRAPH) RFX/GENO EDI
HIV-1 RNA BY PCR: 41600 copies/mL
HIV-1 RNA Quant, Log: 4.619 log10copy/mL

## 2018-10-27 LAB — REFLEX TO GENOSURE(R) MG EDI

## 2018-10-28 ENCOUNTER — Other Ambulatory Visit: Payer: Self-pay

## 2018-10-28 ENCOUNTER — Other Ambulatory Visit: Payer: BC Managed Care – PPO

## 2018-10-28 DIAGNOSIS — B20 Human immunodeficiency virus [HIV] disease: Secondary | ICD-10-CM

## 2018-10-29 LAB — T-HELPER CELL (CD4) - (RCID CLINIC ONLY)
CD4 % Helper T Cell: 33 % (ref 33–65)
CD4 T Cell Abs: 999 /uL (ref 400–1790)

## 2018-11-05 ENCOUNTER — Encounter: Payer: Self-pay | Admitting: Family

## 2018-11-05 ENCOUNTER — Other Ambulatory Visit: Payer: Self-pay

## 2018-11-05 ENCOUNTER — Ambulatory Visit (INDEPENDENT_AMBULATORY_CARE_PROVIDER_SITE_OTHER): Payer: BC Managed Care – PPO | Admitting: Family Medicine

## 2018-11-05 ENCOUNTER — Encounter: Payer: Self-pay | Admitting: Family Medicine

## 2018-11-05 VITALS — BP 114/60 | HR 93 | Temp 98.9°F | Resp 18 | Ht 70.0 in | Wt 378.0 lb

## 2018-11-05 DIAGNOSIS — K5900 Constipation, unspecified: Secondary | ICD-10-CM

## 2018-11-05 LAB — COMPREHENSIVE METABOLIC PANEL
AG Ratio: 1.2 (calc) (ref 1.0–2.5)
ALT: 31 U/L (ref 9–46)
AST: 24 U/L (ref 10–40)
Albumin: 4.1 g/dL (ref 3.6–5.1)
Alkaline phosphatase (APISO): 72 U/L (ref 36–130)
BUN/Creatinine Ratio: 13 (calc) (ref 6–22)
BUN: 20 mg/dL (ref 7–25)
CO2: 27 mmol/L (ref 20–32)
Calcium: 9.6 mg/dL (ref 8.6–10.3)
Chloride: 103 mmol/L (ref 98–110)
Creat: 1.53 mg/dL — ABNORMAL HIGH (ref 0.60–1.35)
Globulin: 3.4 g/dL (calc) (ref 1.9–3.7)
Glucose, Bld: 92 mg/dL (ref 65–99)
Potassium: 4.4 mmol/L (ref 3.5–5.3)
Sodium: 136 mmol/L (ref 135–146)
Total Bilirubin: 0.8 mg/dL (ref 0.2–1.2)
Total Protein: 7.5 g/dL (ref 6.1–8.1)

## 2018-11-05 LAB — RPR: RPR Ser Ql: NONREACTIVE

## 2018-11-05 LAB — HIV-1 RNA QUANT-NO REFLEX-BLD
HIV 1 RNA Quant: 21 copies/mL — ABNORMAL HIGH
HIV-1 RNA Quant, Log: 1.32 Log copies/mL — ABNORMAL HIGH

## 2018-11-05 MED ORDER — HYDROCORTISONE (PERIANAL) 2.5 % EX CREA: 1.0000 "application " | TOPICAL_CREAM | Freq: Two times a day (BID) | CUTANEOUS | 0 refills | Status: DC

## 2018-11-05 MED ORDER — DOCUSATE SODIUM 100 MG PO CAPS: 100.0000 mg | ORAL_CAPSULE | Freq: Two times a day (BID) | ORAL | 0 refills | Status: DC

## 2018-11-05 NOTE — Patient Instructions (Signed)
I am referring you to a gastroenterologist for further evaluation of your pain.   Constipation, Adult Constipation is when a person:  Poops (has a bowel movement) fewer times in a week than normal.  Has a hard time pooping.  Has poop that is dry, hard, or bigger than normal. Follow these instructions at home: Eating and drinking   Eat foods that have a lot of fiber, such as: ? Fresh fruits and vegetables. ? Whole grains. ? Beans.  Eat less of foods that are high in fat, low in fiber, or overly processed, such as: ? [place] fries. ? Hamburgers. ? Cookies. ? Candy. ? Soda.  Drink enough fluid to keep your pee (urine) clear or pale yellow. General instructions  Exercise regularly or as told by your doctor.  Go to the restroom when you feel like you need to poop. Do not hold it in.  Take over-the-counter and prescription medicines only as told by your doctor. These include any fiber supplements.  Do pelvic floor retraining exercises, such as: ? Doing deep breathing while relaxing your lower belly (abdomen). ? Relaxing your pelvic floor while pooping.  Watch your condition for any changes.  Keep all follow-up visits as told by your doctor. This is important. Contact a doctor if:  You have pain that gets worse.  You have a fever.  You have not pooped for 4 days.  You throw up (vomit).  You are not hungry.  You lose weight.  You are bleeding from the anus.  You have thin, pencil-like poop (stool). Get help right away if:  You have a fever, and your symptoms suddenly get worse.  You leak poop or have blood in your poop.  Your belly feels hard or bigger than normal (is bloated).  You have very bad belly pain.  You feel dizzy or you faint. This information is not intended to replace advice given to you by your health care provider. Make sure you discuss any questions you have with your health care provider. Document Released: 09/04/2007 Document Revised:  02/28/2017 Document Reviewed: 09/06/2015 Elsevier Patient Education  2020 [employer].

## 2018-11-05 NOTE — Progress Notes (Signed)
  Patient Todd Griffith   Progress Note: Sick Visit Provider: Lanae Boast, FNP  SUBJECTIVE:   Todd Griffith is a 28 y.o. male who  has a past medical history of Asthma and Bronchitis.. Patient presents today for Testicle Pain (right side. last week ), Blood In Stools, and Hemorrhoids (? hemorrhoids )  Patient states that he has been having pain with defecation. He noticed blood on toilet paper with wiping. None in toilet. Concerned for hemorrhoids.  Patient states that testicular pain has resolved.  Review of Systems  Gastrointestinal: Positive for blood in stool and constipation. Negative for abdominal pain.  All other systems reviewed and are negative.    OBJECTIVE: BP 114/60 (BP Location: Left Arm, Patient Position: Sitting, Cuff Size: Large)   Pulse 93   Temp 98.9 F (37.2 C) (Oral)   Resp 18   Ht 5\' 10"  (1.778 m)   Wt (!) 378 lb (171.5 kg)   SpO2 99%   BMI 54.24 kg/m   Wt Readings from Last 3 Encounters:  11/05/18 (!) 378 lb (171.5 kg)  10/23/18 (!) 378 lb (171.5 kg)  10/12/18 (!) 378 lb (171.5 kg)     Physical Exam Exam conducted with a chaperone present.  Genitourinary:    Penis: Uncircumcised.      Scrotum/Testes: Normal.     Prostate: Normal.     Rectum: Guaiac result negative. Tenderness present. No mass, anal fissure, external hemorrhoid or internal hemorrhoid. Normal anal tone.   Difficult to assess due to patient expressing pain.   ASSESSMENT/PLAN:   1. Constipation, unspecified constipation type - docusate sodium (COLACE) 100 MG capsule; Take 1 capsule (100 mg total) by mouth 2 (two) times daily.  Dispense: 10 capsule; Refill: 0 - hydrocortisone (ANUSOL-HC) 2.5 % rectal cream; Place 1 application rectally 2 (two) times daily.  Dispense: 30 g; Refill: 0 - Ambulatory referral to Gastroenterology        The patient was given clear instructions to go to ER or return to medical center if symptoms do  not improve, worsen or new problems develop. The patient verbalized understanding and agreed with plan of Griffith.   Ms. Doug Sou. Nathaneil Canary, FNP-BC [name] [address] [place], [place] [address] [phone]     This note has been created with Dragon speech recognition software and smart phrase technology. Any transcriptional errors are unintentional.

## 2018-11-09 ENCOUNTER — Ambulatory Visit (INDEPENDENT_AMBULATORY_CARE_PROVIDER_SITE_OTHER): Payer: BC Managed Care – PPO | Admitting: Family

## 2018-11-09 ENCOUNTER — Encounter: Payer: Self-pay | Admitting: Gastroenterology

## 2018-11-09 ENCOUNTER — Encounter: Payer: Self-pay | Admitting: Family

## 2018-11-09 ENCOUNTER — Other Ambulatory Visit: Payer: Self-pay

## 2018-11-09 VITALS — BP 133/84 | HR 96 | Temp 98.2°F | Ht 70.0 in | Wt 378.0 lb

## 2018-11-09 DIAGNOSIS — Z23 Encounter for immunization: Secondary | ICD-10-CM | POA: Diagnosis not present

## 2018-11-09 DIAGNOSIS — I5041 Acute combined systolic (congestive) and diastolic (congestive) heart failure: Secondary | ICD-10-CM | POA: Diagnosis not present

## 2018-11-09 DIAGNOSIS — Z0000 Encounter for general adult medical examination without abnormal findings: Secondary | ICD-10-CM | POA: Insufficient documentation

## 2018-11-09 DIAGNOSIS — B20 Human immunodeficiency virus [HIV] disease: Secondary | ICD-10-CM

## 2018-11-09 MED ORDER — BIKTARVY 50-200-25 MG PO TABS: 1.0000 | ORAL_TABLET | Freq: Every day | ORAL | 5 refills | Status: DC

## 2018-11-09 NOTE — Assessment & Plan Note (Signed)
Stable currently with no signs/symptoms of exacerbation.  Continue management per heart failure team.

## 2018-11-09 NOTE — Patient Instructions (Signed)
Nice to see you!  Please continue take your [place] as prescribed daily.  You are doing exceptionally well with her medication regimen and undetectable.  Refills of medication is been sent to the pharmacy.  Plan for follow-up in 3 months or sooner if needed with lab work 1 to 2 weeks prior to appointment.  Please let us know if you have any questions or concerns.  Have a great day and stay safe!

## 2018-11-09 NOTE — Assessment & Plan Note (Signed)
Todd Griffith is working on weight loss through nutrition and physical activity.  Resources previously provided.  Continue to monitor.

## 2018-11-09 NOTE — Progress Notes (Signed)
Subjective:    Patient ID: Todd Griffith, male    DOB: 28-May-1990, 28 y.o.   MRN: [id number]  Chief Complaint  Patient presents with   HIV Positive/AIDS     HPI:  Todd Griffith is a 28 y.o. male with HIV disease and acute combined systolic and diastolic heart failure who was last seen in the office on 10/12/2018 with good adherence and tolerance to his ART regimen of Biktarvy which was started during most recent hospitalization when he was found to have HIV-1.  Blood work at the time showed a viral load that was undetectable with CD4 count of 1089.  Most recent blood work completed on 10/28/2018 shows a viral load that remains undetectable and CD4 count of 999.   Todd Griffith continues to take his Biktarvy as prescribed with no adverse side effects or missed doses.  Overall feeling well today with no new concerns or complaints.Denies fevers, chills, night sweats, headaches, changes in vision, neck pain/stiffness, nausea, diarrhea, vomiting, lesions or rashes.  Todd Griffith has coverage through [facility] and has no problems obtaining his medication from the pharmacy.  Denies any recreational or illicit drug use or tobacco use.  Occasional glass of wine about 2 times per week.  Not currently sexually active but does use condoms when he is.  Last dental exam completed in June.  Denies any feelings of being down, depressed, or hopeless recently.   No Known Allergies    Outpatient Medications Prior to Visit  Medication Sig Dispense Refill   albuterol (PROVENTIL HFA;VENTOLIN HFA) 108 (90 Base) MCG/ACT inhaler Inhale 1-2 puffs into the lungs every 6 (six) hours as needed for wheezing or shortness of breath.     aspirin 81 MG chewable tablet Chew 1 tablet (81 mg total) by mouth daily. 30 tablet 3   atorvastatin (LIPITOR) 20 MG tablet Take 1 tablet (20 mg total) by mouth daily. 90 tablet 3   bisoprolol (ZEBETA) 5 MG tablet Take 0.5 tablets (2.5 mg total) by mouth  daily. 45 tablet 3   docusate sodium (COLACE) 100 MG capsule Take 1 capsule (100 mg total) by mouth 2 (two) times daily. 10 capsule 0   furosemide (LASIX) 80 MG tablet Take 1 tablet (80 mg total) by mouth daily. Take one TAB every morning. 30 tablet 3   hydrocortisone (ANUSOL-HC) 2.5 % rectal cream Place 1 application rectally 2 (two) times daily. 30 g 0   sacubitril-valsartan (ENTRESTO) 49-51 MG Take 1 tablet by mouth 2 (two) times daily. 180 tablet 3   spironolactone (ALDACTONE) 25 MG tablet Take 1 tablet (25 mg total) by mouth daily. 30 tablet 3   bictegravir-emtricitabine-tenofovir AF (BIKTARVY) 50-200-25 MG TABS tablet Take 1 tablet by mouth daily. 30 tablet 5   No facility-administered medications prior to visit.      Past Medical History:  Diagnosis Date   Asthma    Bronchitis      Past Surgical History:  Procedure Laterality Date   RIGHT/LEFT HEART CATH AND CORONARY ANGIOGRAPHY N/A 09/21/2018   Procedure: RIGHT/LEFT HEART CATH AND CORONARY ANGIOGRAPHY;  Surgeon: Leonie Man, MD;  Location: [facility];  Service: Cardiovascular;  Laterality: N/A;       Review of Systems  Constitutional: Negative for appetite change, chills, fatigue, fever and unexpected weight change.  Eyes: Negative for visual disturbance.  Respiratory: Negative for cough, chest tightness, shortness of breath and wheezing.   Cardiovascular: Negative for chest pain and leg swelling.  Gastrointestinal: Negative for abdominal pain, constipation, diarrhea, nausea and vomiting.  Genitourinary: Negative for dysuria, flank pain, frequency, genital sores, hematuria and urgency.  Skin: Negative for rash.  Allergic/Immunologic: Negative for immunocompromised state.  Neurological: Negative for dizziness and headaches.      Objective:    BP 133/84    Pulse 96    Temp 98.2 F (36.8 C) (Oral)    Ht 5\' 10"  (1.778 m)    Wt (!) 378 lb (171.5 kg)    BMI 54.24 kg/m  Nursing note and vital signs  reviewed.  Physical Exam Constitutional:      General: He is not in acute distress.    Appearance: He is well-developed. He is obese.  Eyes:     Conjunctiva/sclera: Conjunctivae normal.  Neck:     Musculoskeletal: Neck supple.  Cardiovascular:     Rate and Rhythm: Normal rate and regular rhythm.     Heart sounds: Normal heart sounds. No murmur. No friction rub. No gallop.   Pulmonary:     Effort: Pulmonary effort is normal. No respiratory distress.     Breath sounds: Normal breath sounds. No wheezing or rales.  Chest:     Chest wall: No tenderness.  Abdominal:     General: Bowel sounds are normal.     Palpations: Abdomen is soft.     Tenderness: There is no abdominal tenderness.  Lymphadenopathy:     Cervical: No cervical adenopathy.  Skin:    General: Skin is warm and dry.     Findings: No rash.  Neurological:     Mental Status: He is alert and oriented to person, place, and time.  Psychiatric:        Behavior: Behavior normal.        Thought Content: Thought content normal.        Judgment: Judgment normal.      [facility] 11/09/2018 11/05/2018 10/07/2018  Decreased Interest 0 1 0  Down, Depressed, Hopeless 0 0 0  PHQ - 2 Score 0 1 0       Assessment & Plan:   Problem List Items Addressed This Visit      Cardiovascular and Mediastinum   Acute combined systolic and diastolic heart failure (HCC)    Stable currently with no signs/symptoms of exacerbation.  Continue management per heart failure team.        Other   Morbid obesity [facility](HCC)    Todd Griffith is working on weight loss through nutrition and physical activity.  Resources previously provided.  Continue to monitor.       HIV disease (HCC) - Primary    Todd Griffith has well-controlled HIV disease and has achieved undetectable status with good adherence and tolerance to his ART regimen of Biktarvy.  No signs/symptoms of opportunistic infection or progressive HIV disease at present.  He has no problems  obtaining his medication from the pharmacy.  Discussed U equals U as well as recommended immunizations.  Continue current dose of Biktarvy.  Plan for follow-up in 3 months or sooner if needed with lab work 1 to 2 weeks prior to appointment or on the same day.      Relevant Medications   bictegravir-emtricitabine-tenofovir AF (BIKTARVY) 50-200-25 MG TABS tablet   Other Relevant Orders   T-helper cell (CD4)- ([facility] clinic only)   Comprehensive metabolic panel   HIV-1 RNA quant-no reflex-bld   Healthcare maintenance     Prevnar updated today.  Dental exam completed in June 2020  Discussed importance of safe sexual practice to reduce risk of acquisition/transmission of STI.  Condoms provided.       Other Visit Diagnoses    Need for vaccination with 13-polyvalent pneumococcal conjugate vaccine       Relevant Orders   Pneumococcal conjugate vaccine 13-valent IM (Completed)       I am having Raymont M. Kosta maintain his albuterol, sacubitril-valsartan, aspirin, furosemide, spironolactone, atorvastatin, bisoprolol, docusate sodium, hydrocortisone, and Biktarvy.   Meds ordered this encounter  Medications   bictegravir-emtricitabine-tenofovir AF (BIKTARVY) 50-200-25 MG TABS tablet    Sig: Take 1 tablet by mouth daily.    Dispense:  30 tablet    Refill:  5    Copay card: [id number] PCN: ACCESS [id number] ID: [id number]    Order Specific Question:   Supervising Provider    Answer:   Judyann Munson [[id number]]     Follow-up: Return in about 3 months (around 02/09/2019), or if symptoms worsen or fail to improve.   Marcos Eke, MSN, FNP-C Nurse Practitioner [facility] for Infectious Disease [facility] Main number: [phone]

## 2018-11-09 NOTE — Assessment & Plan Note (Signed)
Mr. Langenfeld has well-controlled HIV disease and has achieved undetectable status with good adherence and tolerance to his ART regimen of Biktarvy.  No signs/symptoms of opportunistic infection or progressive HIV disease at present.  He has no problems obtaining his medication from the pharmacy.  Discussed U equals U as well as recommended immunizations.  Continue current dose of Biktarvy.  Plan for follow-up in 3 months or sooner if needed with lab work 1 to 2 weeks prior to appointment or on the same day.

## 2018-11-09 NOTE — Assessment & Plan Note (Signed)
   Prevnar updated today.  Dental exam completed in June 2020  Discussed importance of safe sexual practice to reduce risk of acquisition/transmission of STI.  Condoms provided.

## 2018-11-18 NOTE — Telephone Encounter (Signed)
Patient has a 10 week follow up appointment scheduled for 01/11/19. Patient understands he needs to keep this appointment for insurance compliance. Patient was grateful for the call and thanked me.  

## 2018-11-18 NOTE — Telephone Encounter (Signed)
Virtual Visit Pre-Appointment Phone Call  "(Name), I am calling you today to discuss your upcoming appointment. We are currently trying to limit exposure to the virus that causes COVID-19 by seeing patients at home rather than in the office."  1. "What is the BEST phone number to call the day of the visit?" - include this in appointment notes  2. "Do you have or have access to (through a family member/friend) a smartphone with video capability that we can use for your visit?" a. If yes - list this number in appt notes as "cell" (if different from BEST phone #) and list the appointment type as a VIDEO visit in appointment notes b. If no - list the appointment type as a PHONE visit in appointment notes  3. Confirm consent - "In the setting of the current Covid19 crisis, you are scheduled for a (phone or video) visit with your provider on (date) at (time).  Just as we do with many in-office visits, in order for you to participate in this visit, we must obtain consent.  If you'd like, I can send this to your mychart (if signed up) or email for you to review.  Otherwise, I can obtain your verbal consent now.  All virtual visits are billed to your insurance company just like a normal visit would be.  By agreeing to a virtual visit, we'd like you to understand that the technology does not allow for your provider to perform an examination, and thus may limit your provider's ability to fully assess your condition. If your provider identifies any concerns that need to be evaluated in person, we will make arrangements to do so.  Finally, though the technology is pretty good, we cannot assure that it will always work on either your or our end, and in the setting of a video visit, we may have to convert it to a phone-only visit.  In either situation, we cannot ensure that we have a secure connection.  Are you willing to proceed?" STAFF: Did the patient verbally acknowledge consent to telehealth visit? Document  YES/NO here: YES  4. Advise patient to be prepared - "Two hours prior to your appointment, go ahead and check your blood pressure, pulse, oxygen saturation, and your weight (if you have the equipment to check those) and write them all down. When your visit starts, your provider will ask you for this information. If you have an Apple Watch or Kardia device, please plan to have heart rate information ready on the day of your appointment. Please have a pen and paper handy nearby the day of the visit as well."  5. Give patient instructions for MyChart download to smartphone OR Doximity/Doxy.me as below if video visit (depending on what platform provider is using)  6. Inform patient they will receive a phone call 15 minutes prior to their appointment time (may be from unknown caller ID) so they should be prepared to answer    [place] has been deemed a candidate for a follow-up tele-health visit to limit community exposure during the Covid-19 pandemic. I spoke with the patient via phone to ensure availability of phone/video source, confirm preferred email & phone number, and discuss instructions and expectations.  I reminded Keron Marquel Heidt to be prepared with any vital sign and/or heart rhythm information that could potentially be obtained via home monitoring, at the time of his visit. I reminded Larnell Marquel Hedger to expect a phone call prior to  his visit.  Latrelle Dodrill, CMA 11/18/2018 2:29 PM   INSTRUCTIONS FOR DOWNLOADING THE MYCHART APP TO SMARTPHONE  - The patient must first make sure to have activated MyChart and know their login information - If Apple, go to [employer] and type in MyChart in the search bar and download the app. If Android, ask patient to go to [employer] and type in [place] in the search bar and download the app. The app is free but as with any other app downloads, their phone may require them to verify saved  payment information or Apple/Android password.  - The patient will need to then log into the app with their MyChart username and password, and select [facility] as their healthcare provider to link the account. When it is time for your visit, go to the MyChart app, find appointments, and click Begin Video Visit. Be sure to Select Allow for your device to access the Microphone and Camera for your visit. You will then be connected, and your provider will be with you shortly.  **If they have any issues connecting, or need assistance please contact MyChart service desk [phone])**  **If using a computer, in order to ensure the best quality for their visit they will need to use either of the following Internet Browsers: [employer], or Google Chrome**  IF USING DOXIMITY or DOXY.ME - The patient will receive a link just prior to their visit by text.     FULL LENGTH CONSENT FOR TELE-HEALTH VISIT   I hereby voluntarily request, consent and authorize [facility] and its employed or contracted physicians, physician assistants, nurse practitioners or other licensed health care professionals (the Practitioner), to provide me with telemedicine health care services (the "Services") as deemed necessary by the treating Practitioner. I acknowledge and consent to receive the Services by the Practitioner via telemedicine. I understand that the telemedicine visit will involve communicating with the Practitioner through live audiovisual communication technology and the disclosure of certain medical information by electronic transmission. I acknowledge that I have been given the opportunity to request an in-person assessment or other available alternative prior to the telemedicine visit and am voluntarily participating in the telemedicine visit.  I understand that I have the right to withhold or withdraw my consent to the use of telemedicine in the course of my care at any time, without affecting  my right to future care or treatment, and that the Practitioner or I may terminate the telemedicine visit at any time. I understand that I have the right to inspect all information obtained and/or recorded in the course of the telemedicine visit and may receive copies of available information for a reasonable fee.  I understand that some of the potential risks of receiving the Services via telemedicine include:  Marland Kitchen Delay or interruption in medical evaluation due to technological equipment failure or disruption; . Information transmitted may not be sufficient (e.g. poor resolution of images) to allow for appropriate medical decision making by the Practitioner; and/or  . In rare instances, security protocols could fail, causing a breach of personal health information.  Furthermore, I acknowledge that it is my responsibility to provide information about my medical history, conditions and care that is complete and accurate to the best of my ability. I acknowledge that Practitioner's advice, recommendations, and/or decision may be based on factors not within their control, such as incomplete or inaccurate data provided by me or distortions of diagnostic images or specimens that may result from electronic transmissions.  I understand that the practice of medicine is not an exact science and that Practitioner makes no warranties or guarantees regarding treatment outcomes. I acknowledge that I will receive a copy of this consent concurrently upon execution via email to the email address I last provided but may also request a printed copy by calling the office of [place].    I understand that my insurance will be billed for this visit.   I have read or had this consent read to me. . I understand the contents of this consent, which adequately explains the benefits and risks of the Services being provided via telemedicine.  . I have been provided ample opportunity to ask questions regarding this consent and the  Services and have had my questions answered to my satisfaction. . I give my informed consent for the services to be provided through the use of telemedicine in my medical care  By participating in this telemedicine visit I agree to the above.

## 2018-11-24 ENCOUNTER — Encounter (HOSPITAL_COMMUNITY): Payer: Self-pay

## 2018-11-24 ENCOUNTER — Ambulatory Visit (HOSPITAL_COMMUNITY)
Admission: RE | Admit: 2018-11-24 | Discharge: 2018-11-24 | Disposition: A | Discharge status: Home / Self Care | Payer: BC Managed Care – PPO | Source: Ambulatory Visit | Attending: Internal Medicine | Admitting: Internal Medicine

## 2018-11-24 ENCOUNTER — Telehealth (HOSPITAL_COMMUNITY): Payer: Self-pay | Admitting: Licensed Clinical Social Worker

## 2018-11-24 ENCOUNTER — Other Ambulatory Visit: Payer: Self-pay

## 2018-11-24 VITALS — BP 115/64 | HR 99 | Wt 386.0 lb

## 2018-11-24 DIAGNOSIS — J45909 Unspecified asthma, uncomplicated: Secondary | ICD-10-CM | POA: Diagnosis not present

## 2018-11-24 DIAGNOSIS — R0902 Hypoxemia: Secondary | ICD-10-CM | POA: Diagnosis not present

## 2018-11-24 DIAGNOSIS — Z79899 Other long term (current) drug therapy: Secondary | ICD-10-CM | POA: Insufficient documentation

## 2018-11-24 DIAGNOSIS — F1010 Alcohol abuse, uncomplicated: Secondary | ICD-10-CM | POA: Insufficient documentation

## 2018-11-24 DIAGNOSIS — Z6841 Body Mass Index (BMI) 40.0 and over, adult: Secondary | ICD-10-CM | POA: Insufficient documentation

## 2018-11-24 DIAGNOSIS — Z87891 Personal history of nicotine dependence: Secondary | ICD-10-CM | POA: Diagnosis not present

## 2018-11-24 DIAGNOSIS — I10 Essential (primary) hypertension: Secondary | ICD-10-CM

## 2018-11-24 DIAGNOSIS — Z833 Family history of diabetes mellitus: Secondary | ICD-10-CM | POA: Diagnosis not present

## 2018-11-24 DIAGNOSIS — Z8249 Family history of ischemic heart disease and other diseases of the circulatory system: Secondary | ICD-10-CM | POA: Insufficient documentation

## 2018-11-24 DIAGNOSIS — G4730 Sleep apnea, unspecified: Secondary | ICD-10-CM | POA: Insufficient documentation

## 2018-11-24 DIAGNOSIS — I420 Dilated cardiomyopathy: Secondary | ICD-10-CM | POA: Diagnosis not present

## 2018-11-24 DIAGNOSIS — Z7982 Long term (current) use of aspirin: Secondary | ICD-10-CM | POA: Diagnosis not present

## 2018-11-24 DIAGNOSIS — Z21 Asymptomatic human immunodeficiency virus [HIV] infection status: Secondary | ICD-10-CM | POA: Insufficient documentation

## 2018-11-24 DIAGNOSIS — I5022 Chronic systolic (congestive) heart failure: Secondary | ICD-10-CM | POA: Insufficient documentation

## 2018-11-24 DIAGNOSIS — I428 Other cardiomyopathies: Secondary | ICD-10-CM | POA: Insufficient documentation

## 2018-11-24 DIAGNOSIS — G4733 Obstructive sleep apnea (adult) (pediatric): Secondary | ICD-10-CM

## 2018-11-24 DIAGNOSIS — I110 Hypertensive heart disease with heart failure: Secondary | ICD-10-CM | POA: Diagnosis not present

## 2018-11-24 DIAGNOSIS — I2510 Atherosclerotic heart disease of native coronary artery without angina pectoris: Secondary | ICD-10-CM | POA: Diagnosis not present

## 2018-11-24 MED ORDER — BISOPROLOL FUMARATE 5 MG PO TABS: 5.0000 mg | ORAL_TABLET | Freq: Every day | ORAL | 3 refills | Status: DC

## 2018-11-24 NOTE — Progress Notes (Signed)
ADVANCED HF CLINIC NOTE  Referring Physician: Dr [name] Primary Care:  Primary Cardiologist: Dr [name]  HF MD: Dr Shirlee Latch  ID: Dr Drue Second  HPI: [name] is 28 year old with asthma, CAD, HTN, MR, ETOH abuse, morbid obesity, HIV +, and chronic systolic heart failure. He is father died of CAD.   Admitted 10/01/18 with increased SOB. ECHO was performed and showed severely reduced EF. Had LHC/RHC that showed NICM and single vessel disease. He was also diagnosed with HIV and started on biktarvy.   Today he returns for HF follow up. Last visit he was switched from carvedilol to bisoprolol. Overall feeling fine. Denies PND/Orthopnea.  Mild dyspnea with steps. No chest pain. Appetite ok. No fever or chills. He has not been weighing recently. Taking all medications. Using CPAP every night. Working from home. Drinking a glass of wine 1-2 times week. Lives with his girlfriend and newborn.   Cardiac Studies RHC/LHC 09/21/2018 1st Diagonal 70% RA 4 PCWP 21 CO 8.33/CI 3  ECHO 09/18/2018  Severe HK 15-20%, RV normal.  FH: Dad died CAD, Grandfather and Great Grandmother had CAD.   SH: Lives with girlfriend and new born baby. Works full time at [employer] [place]. He does not smoke. Drinks 1/5 alcohol on the weekend. .    Past Medical History:  Diagnosis Date  . Asthma   . Bronchitis     Current Outpatient Medications  Medication Sig Dispense Refill  . albuterol (PROVENTIL HFA;VENTOLIN HFA) 108 (90 Base) MCG/ACT inhaler Inhale 1-2 puffs into the lungs every 6 (six) hours as needed for wheezing or shortness of breath.    Marland Kitchen aspirin 81 MG chewable tablet Chew 1 tablet (81 mg total) by mouth daily. 30 tablet 3  . atorvastatin (LIPITOR) 20 MG tablet Take 1 tablet (20 mg total) by mouth daily. 90 tablet 3  . bictegravir-emtricitabine-tenofovir AF (BIKTARVY) 50-200-25 MG TABS tablet Take 1 tablet by mouth daily. 30 tablet 5  . bisoprolol (ZEBETA) 5 MG tablet Take 0.5 tablets (2.5 mg total) by mouth daily.  45 tablet 3  . furosemide (LASIX) 80 MG tablet Take 1 tablet (80 mg total) by mouth daily. Take one TAB every morning. 30 tablet 3  . hydrocortisone (ANUSOL-HC) 2.5 % rectal cream Place 1 application rectally 2 (two) times daily. 30 g 0  . sacubitril-valsartan (ENTRESTO) 49-51 MG Take 1 tablet by mouth 2 (two) times daily. 180 tablet 3  . spironolactone (ALDACTONE) 25 MG tablet Take 1 tablet (25 mg total) by mouth daily. 30 tablet 3   No current facility-administered medications for this encounter.     No Known Allergies    Social History   Socioeconomic History  . Marital status: Significant Other    Spouse name: Not on file  . Number of children: 1  . Years of education: 35  . Highest education level: Bachelor's degree (e.g., BA, AB, BS)  Occupational History  . Occupation: recovery specialist    Employer: [employer]  Social Needs  . Financial resource strain: Not hard at all  . Food insecurity    Worry: Never true    Inability: Never true  . Transportation needs    Medical: Not on file    Non-medical: Not on file  Tobacco Use  . Smoking status: Former Smoker    Types: Cigarettes    Quit date: 05/31/2018    Years since quitting: 0.4  . Smokeless tobacco: Never Used  Substance and Sexual Activity  . Alcohol  use: Yes    Alcohol/week: 3.0 standard drinks    Types: 3 Cans of beer per week    Frequency: Never    Comment: socially   . Drug use: Not Currently  . Sexual activity: Yes    Partners: Female  Lifestyle  . Physical activity    Days per week: 1 day    Minutes per session: 30 min  . Stress: Not at all  Relationships  . Social Herbalist on phone: Once a week    Gets together: Once a week    Attends religious service: Never    Active member of club or organization: Yes    Attends meetings of clubs or organizations: 1 to 4 times per year    Relationship status: Never married  . Intimate partner violence    Fear of current or ex partner: No     Emotionally abused: No    Physically abused: Not on file    Forced sexual activity: No  Other Topics Concern  . Not on file  Social History Narrative  . Not on file      Family History  Problem Relation Age of Onset  . Hypertension Mother   . Diabetes Mellitus II Father   . CAD Father        Died at 60 of 'CAD' while sleeping   . Obesity Sister     Vitals:   11/24/18 1122  BP: 115/64  Pulse: 99  SpO2: 97%  Weight: (!) 175.1 kg (386 lb)   Wt Readings from Last 3 Encounters:  11/24/18 (!) 175.1 kg (386 lb)  11/09/18 (!) 171.5 kg (378 lb)  11/05/18 (!) 171.5 kg (378 lb)     PHYSICAL EXAM: General:  Well appearing. No resp difficulty HEENT: normal Neck: supple. no JVD. Carotids 2+ bilat; no bruits. No lymphadenopathy or thryomegaly appreciated. Cor: PMI nondisplaced. Regular rate & rhythm. No rubs, gallops or murmurs. Lungs: clear Abdomen: obese, soft, nontender, nondistended. No hepatosplenomegaly. No bruits or masses. Good bowel sounds. Extremities: no cyanosis, clubbing, rash, edema Neuro: alert & orientedx3, cranial nerves grossly intact. moves all 4 extremities w/o difficulty. Affect pleasant     ASSESSMENT & PLAN: 1. Chronic Systolic HF ECHO 5/42/7062   EF 15-20% . Plan to repeat ECHO in 3 months after HF med optimized.   Had 08/2018 RHC/LHC with NICM and single vessel disease 1st diagonal 70% .  - He has not had CMRI but I am not sure he would fit in the MRI scanner. Hold off for now.  NYHA II. Functional improvement.  - Volume status stable. Continue lasix 80 mg daily.  - Continue entresto 49-51 mg twice a day.  - Continue spironolactone 25 mg daily.   -Increase bisoprolol to 5 mg daily.   - Consider bidil down the road but not sure BP will tolerate.  - Plan to repeat ECHO in October.   2. CAD Single vessel disease - LHC 1st diagonal  70%.  - LFTs elevate during his hospitalization.  -LFTs normalized 7/13.  -Continue 81 mg aspirin daily.  - Start  atorvastatin 20 mg daily.   3. HTN  Stable.   4. Morbid Obesity  Body mass index is 55.39 kg/m.  Discussed portion control.   5.HIV +  Now followed by ID. On biktarvy.  6  ETOH abuse Discussed that he needs to avoid alcohol     7. Severe Sleep Apnea.  [place] Sleep study showed --> AHI 32.9 nocturna  hypoxia. -Started on CPAP and has follow up with Dr Mayford Knifeurner.   Follow up in 3 weeks for med titration and 8 weeks with Dr Shirlee LatchMcLean and an ECHO.  Todd Fetterman NP-C  11:35 AM

## 2018-11-24 NOTE — Patient Instructions (Signed)
INCREASE Bisoprolol to 5mg  daily.  Please follow up with the [facility] in 3 weeks and again at the end of October.  At the [facility], you and your health needs are our priority. As part of our continuing mission to provide you with exceptional heart care, we have created designated Provider Care Teams. These Care Teams include your primary Cardiologist (physician) and Advanced Practice Providers (APPs- Physician Assistants and Nurse Practitioners) who all work together to provide you with the care you need, when you need it.   You may see any of the following providers on your designated Care Team at your next follow up: Marland Kitchen Dr Glori Bickers . Dr Loralie Champagne . Darrick Grinder, NP   Please be sure to bring in all your medications bottles to every appointment.

## 2018-11-24 NOTE — Telephone Encounter (Signed)
Pt inquiring about [facility].  CSW met with pt and explained that the support group is not currently meeting due to [place] concerns but that we could add him to the list to be contacted when the mens group is able to start meeting again.  CSW and pt then talked about how he is dealing with his new diagnosis of heart failure.  Pt states he is doing well adjusting to the new medical requirements- trying to reduce sodium intake, working on getting fit again, taking his medications- but has had some struggles being so young and dealing with this kind of medical issue while his peers can't relate.  CSW provided active listening and support- provided with CSW phone number in case he needs to talk or needs help with anything.  CSW will continue to follow and assist as needed  Jorge Ny, [facility] Desk#: [phone] Cell#: [phone]

## 2018-12-01 ENCOUNTER — Other Ambulatory Visit: Payer: Self-pay

## 2018-12-01 MED ORDER — SACUBITRIL-VALSARTAN 49-51 MG PO TABS: 1.0000 | ORAL_TABLET | Freq: Two times a day (BID) | ORAL | 3 refills | Status: DC

## 2018-12-08 ENCOUNTER — Ambulatory Visit: Payer: BC Managed Care – PPO | Admitting: Gastroenterology

## 2018-12-09 ENCOUNTER — Encounter (HOSPITAL_COMMUNITY): Payer: Self-pay | Admitting: *Deleted

## 2018-12-09 ENCOUNTER — Encounter (HOSPITAL_COMMUNITY): Payer: Self-pay

## 2018-12-10 ENCOUNTER — Ambulatory Visit: Payer: BC Managed Care – PPO | Admitting: Gastroenterology

## 2018-12-15 ENCOUNTER — Ambulatory Visit (HOSPITAL_COMMUNITY)
Admission: RE | Admit: 2018-12-15 | Discharge: 2018-12-15 | Disposition: A | Discharge status: Home / Self Care | Payer: BC Managed Care – PPO | Source: Ambulatory Visit | Attending: Adult Health | Admitting: Adult Health

## 2018-12-15 ENCOUNTER — Other Ambulatory Visit: Payer: Self-pay

## 2018-12-15 ENCOUNTER — Encounter (HOSPITAL_COMMUNITY): Payer: Self-pay

## 2018-12-15 VITALS — BP 135/60 | HR 98 | Wt 384.0 lb

## 2018-12-15 DIAGNOSIS — Z87891 Personal history of nicotine dependence: Secondary | ICD-10-CM | POA: Diagnosis not present

## 2018-12-15 DIAGNOSIS — I5022 Chronic systolic (congestive) heart failure: Secondary | ICD-10-CM | POA: Diagnosis not present

## 2018-12-15 DIAGNOSIS — Z79899 Other long term (current) drug therapy: Secondary | ICD-10-CM | POA: Insufficient documentation

## 2018-12-15 DIAGNOSIS — Z7982 Long term (current) use of aspirin: Secondary | ICD-10-CM | POA: Insufficient documentation

## 2018-12-15 DIAGNOSIS — G4733 Obstructive sleep apnea (adult) (pediatric): Secondary | ICD-10-CM | POA: Diagnosis not present

## 2018-12-15 DIAGNOSIS — Z21 Asymptomatic human immunodeficiency virus [HIV] infection status: Secondary | ICD-10-CM | POA: Insufficient documentation

## 2018-12-15 DIAGNOSIS — G4730 Sleep apnea, unspecified: Secondary | ICD-10-CM | POA: Diagnosis not present

## 2018-12-15 DIAGNOSIS — Z8249 Family history of ischemic heart disease and other diseases of the circulatory system: Secondary | ICD-10-CM | POA: Diagnosis not present

## 2018-12-15 DIAGNOSIS — I2510 Atherosclerotic heart disease of native coronary artery without angina pectoris: Secondary | ICD-10-CM | POA: Diagnosis not present

## 2018-12-15 DIAGNOSIS — Z833 Family history of diabetes mellitus: Secondary | ICD-10-CM | POA: Insufficient documentation

## 2018-12-15 DIAGNOSIS — J45909 Unspecified asthma, uncomplicated: Secondary | ICD-10-CM | POA: Diagnosis not present

## 2018-12-15 DIAGNOSIS — I428 Other cardiomyopathies: Secondary | ICD-10-CM | POA: Diagnosis not present

## 2018-12-15 DIAGNOSIS — F1010 Alcohol abuse, uncomplicated: Secondary | ICD-10-CM | POA: Diagnosis not present

## 2018-12-15 DIAGNOSIS — Z6841 Body Mass Index (BMI) 40.0 and over, adult: Secondary | ICD-10-CM | POA: Insufficient documentation

## 2018-12-15 DIAGNOSIS — I110 Hypertensive heart disease with heart failure: Secondary | ICD-10-CM | POA: Insufficient documentation

## 2018-12-15 MED ORDER — SACUBITRIL-VALSARTAN 97-103 MG PO TABS: 1.0000 | ORAL_TABLET | Freq: Two times a day (BID) | ORAL | 11 refills | Status: DC

## 2018-12-15 NOTE — Progress Notes (Signed)
ADVANCED HF CLINIC NOTE  Referring Physician: Dr Tenny Crawoss Primary Care:  Primary Cardiologist: Dr Tenny Crawoss  HF MD: Dr Shirlee LatchMcLean  ID: Dr Drue SecondSnider  HPI: Mr Todd ScotlandHouse is 28 year old with asthma, CAD, HTN, MR, ETOH abuse, morbid obesity, HIV +, and chronic systolic heart failure. He is father died of CAD.   Admitted 09/17/2018 with increased SOB. ECHO was performed and showed severely reduced EF. Had LHC/RHC that showed NICM and single vessel disease. He was also diagnosed with HIV and started on biktarvy.   Today he returns for HF follow up. Last visit bisoprolol was increased to 5 mg daily. Overall feeling fine. SOB with steps. Denies PND/Orthopnea. No chest pain.  Using CPAP. Appetite ok. No fever or chills.  He has not been weighing. Taking all medications.  Cardiac Studies RHC/LHC 09/21/2018 1st Diagonal 70% RA 4 PCWP 21 CO 8.33/CI 3  ECHO 09/18/2018  Severe HK 15-20%, RV normal.  FH: Dad died CAD, Grandfather and Great Grandmother had CAD.   SH: Lives with girlfriend and new born baby. Works full time at [employer]. He does not smoke. Drinks 1/5 alcohol on the weekend. .    Past Medical History:  Diagnosis Date  . Asthma   . Bronchitis     Current Outpatient Medications  Medication Sig Dispense Refill  . albuterol (PROVENTIL HFA;VENTOLIN HFA) 108 (90 Base) MCG/ACT inhaler Inhale 1-2 puffs into the lungs every 6 (six) hours as needed for wheezing or shortness of breath.    Marland Kitchen. aspirin 81 MG chewable tablet Chew 1 tablet (81 mg total) by mouth daily. 30 tablet 3  . atorvastatin (LIPITOR) 20 MG tablet Take 1 tablet (20 mg total) by mouth daily. 90 tablet 3  . bictegravir-emtricitabine-tenofovir AF (BIKTARVY) 50-200-25 MG TABS tablet Take 1 tablet by mouth daily. 30 tablet 5  . bisoprolol (ZEBETA) 5 MG tablet Take 1 tablet (5 mg total) by mouth daily. 90 tablet 3  . furosemide (LASIX) 80 MG tablet Take 1 tablet (80 mg total) by mouth daily. Take one TAB every morning. 30 tablet 3  .  hydrocortisone (ANUSOL-HC) 2.5 % rectal cream Place 1 application rectally 2 (two) times daily. 30 g 0  . sacubitril-valsartan (ENTRESTO) 49-51 MG Take 1 tablet by mouth 2 (two) times daily. 180 tablet 3  . spironolactone (ALDACTONE) 25 MG tablet Take 1 tablet (25 mg total) by mouth daily. 30 tablet 3   No current facility-administered medications for this encounter.     No Known Allergies    Social History   Socioeconomic History  . Marital status: Significant Other    Spouse name: Not on file  . Number of children: 1  . Years of education: 2916  . Highest education level: Bachelor's degree (e.g., BA, AB, BS)  Occupational History  . Occupation: recovery specialist    Employer: [employer]  Social Needs  . Financial resource strain: Not hard at all  . Food insecurity    Worry: Never true    Inability: Never true  . Transportation needs    Medical: Not on file    Non-medical: Not on file  Tobacco Use  . Smoking status: Former Smoker    Types: Cigarettes    Quit date: 05/31/2018    Years since quitting: 0.5  . Smokeless tobacco: Never Used  Substance and Sexual Activity  . Alcohol use: Yes    Alcohol/week: 3.0 standard drinks    Types: 3 Cans of beer per week  Frequency: Never    Comment: socially   . Drug use: Not Currently  . Sexual activity: Yes    Partners: Female  Lifestyle  . Physical activity    Days per week: 1 day    Minutes per session: 30 min  . Stress: Not at all  Relationships  . Social Musician on phone: Once a week    Gets together: Once a week    Attends religious service: Never    Active member of club or organization: Yes    Attends meetings of clubs or organizations: 1 to 4 times per year    Relationship status: Never married  . Intimate partner violence    Fear of current or ex partner: No    Emotionally abused: No    Physically abused: Not on file    Forced sexual activity: No  Other Topics Concern  . Not on file   Social History Narrative  . Not on file      Family History  Problem Relation Age of Onset  . Hypertension Mother   . Diabetes Mellitus II Father   . CAD Father        Died at 70 of 'CAD' while sleeping   . Obesity Sister     Vitals:   12/15/18 1107  BP: 135/60  Pulse: 98  SpO2: 100%  Weight: (!) 174.2 kg (384 lb)   Wt Readings from Last 3 Encounters:  12/15/18 (!) 174.2 kg (384 lb)  11/24/18 (!) 175.1 kg (386 lb)  11/09/18 (!) 171.5 kg (378 lb)     PHYSICAL EXAM: General:  Well appearing. No resp difficulty HEENT: normal Neck: supple. no JVD. Carotids 2+ bilat; no bruits. No lymphadenopathy or thryomegaly appreciated. Cor: PMI nondisplaced. Regular rate & rhythm. No rubs, gallops or murmurs. Lungs: clear Abdomen: obese, soft, nontender, nondistended. No hepatosplenomegaly. No bruits or masses. Good bowel sounds. Extremities: no cyanosis, clubbing, rash, edema Neuro: alert & orientedx3, cranial nerves grossly intact. moves all 4 extremities w/o difficulty. Affect pleasant  ASSESSMENT & PLAN: 1. Chronic Systolic HF ECHO 06/28/760   EF 15-20% . Plan to repeat ECHO in 3 months after HF med optimized.   Had 08/2018 RHC/LHC with NICM and single vessel disease 1st diagonal 70% .  - He has not had CMRI but I am not sure he would fit in the MRI scanner. Hold off for now.  NYHA II. Volume status stable.  - Volume status stable. Continue lasix 80 mg daily.  - Increase entresto 97-103 twice a day. Check BMET next visit.  - Continue spironolactone 25 mg daily.   -Continue bisoprolol to 5 mg daily.   - Consider bidil down the road but not sure BP will tolerate.  - Plan to repeat ECHO in October.   2. CAD Single vessel disease - LHC 1st diagonal  70%.  - LFTs elevate during his hospitalization.  -LFTs normalized 7/13.  - No chest pain.  -Continue 81 mg aspirin daily.  - Continue  atorvastatin 20 mg daily.   3. HTN  Improved.   4. Morbid Obesity  Body mass index is  55.1 kg/m.  Discussed portion control.   5.HIV +  Now followed by ID. On biktarvy.  6  ETOH abuse Discussed that he needs to avoid alcohol     7. Severe Sleep Apnea.  Todd Griffith Sleep study showed --> AHI 32.9 nocturna hypoxia. -Started on CPAP and has follow up with Dr Mayford Knife.   Follow up  in 2 weeks with Amy. Follow up in October with Dr Aundra Dubin and an ECHO.   Amy Clegg NP-C  11:11 AM

## 2018-12-15 NOTE — Patient Instructions (Signed)
INCREASE Entresto 97/103mg . One tab twice daily.  Your physician has requested that you have an echocardiogram. Echocardiography is a painless test that uses sound waves to create images of your heart. It provides your doctor with information about the size and shape of your heart and how well your heart's chambers and valves are working. This procedure takes approximately one hour. There are no restrictions for this procedure. This will be done at your appointment in October.  Please follow up with the [facility] in 2 weeks.  At the [facility], you and your health needs are our priority. As part of our continuing mission to provide you with exceptional heart care, we have created designated Provider Care Teams. These Care Teams include your primary Cardiologist (physician) and Advanced Practice Providers (APPs- Physician Assistants and Nurse Practitioners) who all work together to provide you with the care you need, when you need it.   You may see any of the following providers on your designated Care Team at your next follow up: Marland Kitchen Dr Glori Bickers . Dr Loralie Champagne . Darrick Grinder, NP   Please be sure to bring in all your medications bottles to every appointment.

## 2018-12-15 NOTE — Addendum Note (Signed)
Encounter addended by: Marlise Eves, RN on: 12/15/2018 11:40 AM  Actions taken: Order list changed, Diagnosis association updated

## 2018-12-15 NOTE — Addendum Note (Signed)
Encounter addended by: Marlise Eves, RN on: 12/15/2018 11:44 AM  Actions taken: Pharmacy for encounter modified, Order list changed

## 2018-12-17 ENCOUNTER — Encounter: Payer: Self-pay | Admitting: Physician Assistant

## 2018-12-17 ENCOUNTER — Other Ambulatory Visit: Payer: Self-pay

## 2018-12-17 ENCOUNTER — Ambulatory Visit: Payer: BC Managed Care – PPO | Admitting: Physician Assistant

## 2018-12-17 VITALS — BP 118/76 | HR 87 | Temp 98.0°F | Ht 70.0 in | Wt 386.0 lb

## 2018-12-17 DIAGNOSIS — K602 Anal fissure, unspecified: Secondary | ICD-10-CM | POA: Diagnosis not present

## 2018-12-17 DIAGNOSIS — K625 Hemorrhage of anus and rectum: Secondary | ICD-10-CM | POA: Diagnosis not present

## 2018-12-17 MED ORDER — AMBULATORY NON FORMULARY MEDICATION: 0 refills | Status: DC

## 2018-12-17 NOTE — Patient Instructions (Signed)
We have sent a prescription for nitroglycerin 0.125% gel to [facility]. You should apply a pea size amount to your rectum three times daily x 6-8 weeks.  [facility]'s information is below: Address: [address], [place], [place] [address]  [phone]  *Please DO NOT go directly from our office to pick up this medication! Give the pharmacy 1 day to process the prescription as this is compounded and takes time to make.  We have sent the following medications to your pharmacy for you to pick up at your convenience: [facility] Ointment  Start Recta-Care( over the counter) with Lidocaine as needed.    How to Take a [employer] A sitz bath is a warm water bath that may be used to care for your rectum, genital area, or the area between your rectum and genitals (perineum). For a sitz bath, the water only comes up to your hips and covers your buttocks. A sitz bath may done at home in a bathtub or with a portable sitz bath that fits over the toilet. Your health care provider may recommend a sitz bath to help:  Relieve pain and discomfort after delivering a baby.  Relieve pain and itching from hemorrhoids or anal fissures.  Relieve pain after certain surgeries.  Relax muscles that are sore or tight. How to take a sitz bath Take 3-4 sitz baths a day, or as many as told by your health care provider. Bathtub sitz bath To take a sitz bath in a bathtub: 1. Partially fill a bathtub with warm water. The water should be deep enough to cover your hips and buttocks when you are sitting in the tub. 2. If your health care provider told you to put medicine in the water, follow his or her instructions. 3. Sit in the water. 4. Open the tub drain a little, and leave it open during your bath. 5. Turn on the warm water again, enough to replace the water that is draining out. Keep the water running throughout your bath. This helps keep the water at the right level and the right temperature.  6. Soak in the water for 15-20 minutes, or as long as told by your health care provider. 7. When you are done, be careful when you stand up. You may feel dizzy. 8. After the sitz bath, pat yourself dry. Do not rub your skin to dry it.  Over-the-toilet sitz bath To take a sitz bath with an over-the-toilet basin: 1. Follow the manufacturer's instructions. 2. Fill the basin with warm water. 3. If your health care provider told you to put medicine in the water, follow his or her instructions. 4. Sit on the seat. Make sure the water covers your buttocks and perineum. 5. Soak in the water for 15-20 minutes, or as long as told by your health care provider. 6. After the sitz bath, pat yourself dry. Do not rub your skin to dry it. 7. Clean and dry the basin between uses. 8. Discard the basin if it cracks, or according to the manufacturer's instructions. Contact a health care provider if:  Your symptoms get worse. Do not continue with sitz baths if your symptoms get worse.  You have new symptoms. If this happens, do not continue with sitz baths until you talk with your health care provider. Summary  A sitz bath is a warm water bath in which the water only comes up to your hips and covers your buttocks.  A sitz bath may help relieve itching,  relieve pain, and relax muscles that are sore or tight in the lower part of your body, including your genital area.  Take 3-4 sitz baths a day, or as many as told by your health care provider. Soak in the water for 15-20 minutes.  Do not continue with sitz baths if your symptoms get worse. This information is not intended to replace advice given to you by your health care provider. Make sure you discuss any questions you have with your health care provider. Document Released: 12/09/2003 Document Revised: 03/20/2017 Document Reviewed: 03/20/2017 Elsevier Patient Education  2020 [place] you for choosing me and [facility].   Todd Griffith

## 2018-12-17 NOTE — Progress Notes (Signed)
Chief Complaint: Rectal bleeding and pain  HPI:    Mr. Todd Griffith is a 28 year old male with a past medical history as listed below including HIV, who was referred to me by Mike Gipouglas, Andre, FNP for a complaint of rectal bleeding and pain.      11/05/2018 office visit with PCP.  At that time discussed pain with defecation and some blood in his stools.  Started on Colace 100 mg capsules, 1 capsule twice daily and given Anusol 2.5% rectal cream to be applied rectally 2 times a day.    Today, patient tells me that for the past month and a half or so he has had rectal pain when passing a stool.  It is sharp and stinging in nature and he will often see bright red blood on his stool or the toilet paper afterwards.  This pain is so much so that he tries to resist from having a bowel movement.  Tells me that he is having daily soft solid stools.  Did try the Anusol cream for 2 weeks as prescribed but this made no change in his symptoms.  Also tried Colace which just made things sticky.    Denies fever, chills, weight loss, family history of colon cancer or IBD.  Past Medical History:  Diagnosis Date  . Asthma   . Bronchitis   HIV/AIDS  Past Surgical History:  Procedure Laterality Date  . RIGHT/LEFT HEART CATH AND CORONARY ANGIOGRAPHY N/A 09/21/2018   Procedure: RIGHT/LEFT HEART CATH AND CORONARY ANGIOGRAPHY;  Surgeon: Marykay LexHarding, David W, MD;  [facility];  Service: Cardiovascular;  Laterality: N/A;    Current Outpatient Medications  Medication Sig Dispense Refill  . albuterol (PROVENTIL HFA;VENTOLIN HFA) 108 (90 Base) MCG/ACT inhaler Inhale 1-2 puffs into the lungs every 6 (six) hours as needed for wheezing or shortness of breath.    Marland Kitchen. aspirin 81 MG chewable tablet Chew 1 tablet (81 mg total) by mouth daily. 30 tablet 3  . atorvastatin (LIPITOR) 20 MG tablet Take 1 tablet (20 mg total) by mouth daily. 90 tablet 3  . bictegravir-emtricitabine-tenofovir AF (BIKTARVY) 50-200-25 MG TABS tablet  Take 1 tablet by mouth daily. 30 tablet 5  . bisoprolol (ZEBETA) 5 MG tablet Take 1 tablet (5 mg total) by mouth daily. 90 tablet 3  . furosemide (LASIX) 80 MG tablet Take 1 tablet (80 mg total) by mouth daily. Take one TAB every morning. 30 tablet 3  . hydrocortisone (ANUSOL-HC) 2.5 % rectal cream Place 1 application rectally 2 (two) times daily. 30 g 0  . sacubitril-valsartan (ENTRESTO) 97-103 MG Take 1 tablet by mouth 2 (two) times daily. 60 tablet 11  . spironolactone (ALDACTONE) 25 MG tablet Take 1 tablet (25 mg total) by mouth daily. 30 tablet 3   No current facility-administered medications for this visit.     Allergies as of 12/17/2018  . (No Known Allergies)    Family History  Problem Relation Age of Onset  . Hypertension Mother   . Diabetes Mellitus II Father   . CAD Father        Died at 9043 of 'CAD' while sleeping   . Obesity Sister     Social History   Socioeconomic History  . Marital status: Significant Other    Spouse name: Not on file  . Number of children: 1  . Years of education: 7916  . Highest education level: Bachelor's degree (e.g., BA, AB, BS)  Occupational History  . Occupation: recovery specialist  Employer: [employer]  Social Needs  . Financial resource strain: Not hard at all  . Food insecurity    Worry: Never true    Inability: Never true  . Transportation needs    Medical: Not on file    Non-medical: Not on file  Tobacco Use  . Smoking status: Former Smoker    Types: Cigarettes    Quit date: 05/31/2018    Years since quitting: 0.5  . Smokeless tobacco: Never Used  Substance and Sexual Activity  . Alcohol use: Yes    Alcohol/week: 3.0 standard drinks    Types: 3 Cans of beer per week    Frequency: Never    Comment: socially   . Drug use: Not Currently  . Sexual activity: Yes    Partners: Female  Lifestyle  . Physical activity    Days per week: 1 day    Minutes per session: 30 min  . Stress: Not at all  Relationships  .  Social Herbalist on phone: Once a week    Gets together: Once a week    Attends religious service: Never    Active member of club or organization: Yes    Attends meetings of clubs or organizations: 1 to 4 times per year    Relationship status: Never married  . Intimate partner violence    Fear of current or ex partner: No    Emotionally abused: No    Physically abused: Not on file    Forced sexual activity: No  Other Topics Concern  . Not on file  Social History Narrative  . Not on file    Review of Systems:    Constitutional: No weight loss, fever or chills Skin: No rash  Cardiovascular: No chest pain Respiratory: No SOB  Gastrointestinal: See HPI and otherwise negative Genitourinary: No dysuria Neurological: No headache, dizziness or syncope Musculoskeletal: No new muscle or joint pain Hematologic: No bruising Psychiatric: No history of depression or anxiety   Physical Exam:  Vital signs: Temp 98 F (36.7 C)   Ht 5\' 10"  (1.778 m)   Wt (!) 386 lb (175.1 kg)   BMI 55.39 kg/m    Constitutional:   Pleasant obese AA male appears to be in NAD, Well developed, Well nourished, alert and cooperative Head:  Normocephalic and atraumatic. Eyes:   PEERL, EOMI. No icterus. Conjunctiva pink. Ears:  Normal auditory acuity. Neck:  Supple Throat: Oral cavity and pharynx without inflammation, swelling or lesion.  Respiratory: Respirations even and unlabored. Lungs clear to auscultation bilaterally.   No wheezes, crackles, or rhonchi.  Cardiovascular: Normal S1, S2. No MRG. Regular rate and rhythm. No peripheral edema, cyanosis or pallor.  Gastrointestinal:  Soft, nondistended, nontender. No rebound or guarding. Normal bowel sounds. No appreciable masses or hepatomegaly. Rectal:  External: Posterior fissure with skin tag, ttp; Internal: not done due to tenderness Msk:  Symmetrical without gross deformities. Without edema, no deformity or joint abnormality.  Neurologic:   Alert and  oriented x4;  grossly normal neurologically.  Skin:   Dry and intact without significant lesions or rashes. Psychiatric:  Demonstrates good judgement and reason without abnormal affect or behaviors.  MOST RECENT LABS AND IMAGING: CBC    Component Value Date/Time   WBC 6.9 09/20/2018 0757   RBC 4.27 09/20/2018 0757   HGB 13.7 09/22/2018 1129   HCT 41.0 09/21/2018 0947   PLT 245 09/20/2018 0757   MCV 90.9 09/20/2018 0757   MCH 30.4 09/20/2018  0757   MCHC 33.5 09/20/2018 0757   RDW 12.7 09/20/2018 0757   LYMPHSABS 2.9 09/17/2018 1941   MONOABS 0.4 09/17/2018 1941   EOSABS 0.6 (H) 09/17/2018 1941   BASOSABS 0.0 09/17/2018 1941    CMP     Component Value Date/Time   NA 136 10/28/2018 1006   K 4.4 10/28/2018 1006   CL 103 10/28/2018 1006   CO2 27 10/28/2018 1006   GLUCOSE 92 10/28/2018 1006   BUN 20 10/28/2018 1006   CREATININE 1.53 (H) 10/28/2018 1006   CALCIUM 9.6 10/28/2018 1006   PROT 7.5 10/28/2018 1006   ALBUMIN 3.9 09/30/2018 1102   AST 24 10/28/2018 1006   ALT 31 10/28/2018 1006   ALKPHOS 64 09/30/2018 1102   BILITOT 0.8 10/28/2018 1006   GFRNONAA 58 (L) 10/12/2018 1647   GFRAA 67 10/12/2018 1647    Assessment: 1.  Anal fissure: Posterior on exam today, causing rectal pain and bleeding 2.  Rectal bleeding: With above  Plan: 1.  Prescribed Nitroglycerin ointment 3 times daily x6-8 weeks.  Discussed application of this in detail with the patient. 2.  Would recommend the patient buy RectiCare cream with lidocaine over-the-counter and apply as needed.  Can try applying it before a bowel movement to help some with the pain. 3.  Recommend sitz bath 2-3 times a day for 15 to 20 minutes. 4.  Recommend the patient keep his stools soft and solid.  He may use a stool softener if necessary. 5.  Patient to follow in clinic with me in 6 to 8 weeks.  Discussed that if he is completely better then he can cancel follow-up.  He was assigned to Dr. Leone Payor today.   Hyacinth Meeker, PA-C [facility] 12/17/2018, 10:07 AM  Cc: Mike Gip, FNP

## 2018-12-29 ENCOUNTER — Ambulatory Visit (HOSPITAL_COMMUNITY)
Admission: RE | Admit: 2018-12-29 | Discharge: 2018-12-29 | Disposition: A | Discharge status: Home / Self Care | Payer: BC Managed Care – PPO | Source: Ambulatory Visit | Attending: Adult Health | Admitting: Adult Health

## 2018-12-29 ENCOUNTER — Other Ambulatory Visit: Payer: Self-pay

## 2018-12-29 ENCOUNTER — Encounter (HOSPITAL_COMMUNITY): Payer: Self-pay

## 2018-12-29 VITALS — BP 118/84 | HR 89 | Wt 386.2 lb

## 2018-12-29 DIAGNOSIS — I110 Hypertensive heart disease with heart failure: Secondary | ICD-10-CM | POA: Diagnosis not present

## 2018-12-29 DIAGNOSIS — J45909 Unspecified asthma, uncomplicated: Secondary | ICD-10-CM | POA: Diagnosis not present

## 2018-12-29 DIAGNOSIS — G4730 Sleep apnea, unspecified: Secondary | ICD-10-CM | POA: Diagnosis not present

## 2018-12-29 DIAGNOSIS — Z7982 Long term (current) use of aspirin: Secondary | ICD-10-CM | POA: Diagnosis not present

## 2018-12-29 DIAGNOSIS — I428 Other cardiomyopathies: Secondary | ICD-10-CM | POA: Diagnosis not present

## 2018-12-29 DIAGNOSIS — I5042 Chronic combined systolic (congestive) and diastolic (congestive) heart failure: Secondary | ICD-10-CM | POA: Diagnosis not present

## 2018-12-29 DIAGNOSIS — G4733 Obstructive sleep apnea (adult) (pediatric): Secondary | ICD-10-CM

## 2018-12-29 DIAGNOSIS — Z79899 Other long term (current) drug therapy: Secondary | ICD-10-CM | POA: Diagnosis not present

## 2018-12-29 DIAGNOSIS — Z87891 Personal history of nicotine dependence: Secondary | ICD-10-CM | POA: Diagnosis not present

## 2018-12-29 DIAGNOSIS — I5022 Chronic systolic (congestive) heart failure: Secondary | ICD-10-CM | POA: Diagnosis not present

## 2018-12-29 DIAGNOSIS — Z8249 Family history of ischemic heart disease and other diseases of the circulatory system: Secondary | ICD-10-CM | POA: Diagnosis not present

## 2018-12-29 DIAGNOSIS — I2510 Atherosclerotic heart disease of native coronary artery without angina pectoris: Secondary | ICD-10-CM | POA: Diagnosis not present

## 2018-12-29 DIAGNOSIS — Z8349 Family history of other endocrine, nutritional and metabolic diseases: Secondary | ICD-10-CM | POA: Insufficient documentation

## 2018-12-29 DIAGNOSIS — R0902 Hypoxemia: Secondary | ICD-10-CM | POA: Diagnosis not present

## 2018-12-29 DIAGNOSIS — Z833 Family history of diabetes mellitus: Secondary | ICD-10-CM | POA: Insufficient documentation

## 2018-12-29 DIAGNOSIS — I10 Essential (primary) hypertension: Secondary | ICD-10-CM

## 2018-12-29 DIAGNOSIS — Z6841 Body Mass Index (BMI) 40.0 and over, adult: Secondary | ICD-10-CM | POA: Diagnosis not present

## 2018-12-29 DIAGNOSIS — F1010 Alcohol abuse, uncomplicated: Secondary | ICD-10-CM | POA: Insufficient documentation

## 2018-12-29 DIAGNOSIS — Z21 Asymptomatic human immunodeficiency virus [HIV] infection status: Secondary | ICD-10-CM | POA: Insufficient documentation

## 2018-12-29 LAB — BASIC METABOLIC PANEL
Anion gap: 10 (ref 5–15)
BUN: 25 mg/dL — ABNORMAL HIGH (ref 6–20)
CO2: 24 mmol/L (ref 22–32)
Calcium: 9.6 mg/dL (ref 8.9–10.3)
Chloride: 102 mmol/L (ref 98–111)
Creatinine, Ser: 1.48 mg/dL — ABNORMAL HIGH (ref 0.61–1.24)
Glucose, Bld: 96 mg/dL (ref 70–99)
Potassium: 4.2 mmol/L (ref 3.5–5.1)
Sodium: 136 mmol/L (ref 135–145)

## 2018-12-29 MED ORDER — BISOPROLOL FUMARATE 5 MG PO TABS: 7.5000 mg | ORAL_TABLET | Freq: Every day | ORAL | 3 refills | Status: DC

## 2018-12-29 NOTE — Progress Notes (Signed)
ADVANCED HF CLINIC NOTE  Referring Physician: Dr Harrington Challenger Primary Care:  Primary Cardiologist: Dr Harrington Challenger  HF MD: Dr Aundra Dubin  ID: Dr Baxter Flattery  HPI: [name] is 28 year old with asthma, CAD, HTN, MR, ETOH abuse, morbid obesity, HIV +, and chronic systolic heart failure. His father died of CAD.   Admitted 2018-10-08 with increased SOB. ECHO was performed and showed severely reduced EF. Had LHC/RHC that showed NICM and single vessel disease. He was also diagnosed with HIV and started on biktarvy.   Today he returns for HF follow up. Last visit entresto was increased to 97-103 twice a day. Overall feeling fine. Says he has more energy with CPAP. SOB with steps. Denies PND/Orthopnea. Appetite ok. No fever or chills. Weight at home  380-384 pounds. Using CPAP every night. Rarely drinking alcohol. Taking all medications. He continues to work from home.   Cardiac Studies RHC/LHC 09/21/2018 1st Diagonal 70% RA 4 PCWP 21 CO 8.33/CI 3  ECHO 09/18/2018  Severe HK 15-20%, RV normal.  FH: Dad died CAD, Grandfather and Great Grandmother had CAD.   SH: Lives with girlfriend and new born baby. Works full time at [employer]. He does not smoke. Drinks 1/5 alcohol on the weekend. .    Past Medical History:  Diagnosis Date  . Asthma   . Bronchitis   . HIV (human immunodeficiency virus infection) ([place])     Current Outpatient Medications  Medication Sig Dispense Refill  . albuterol (PROVENTIL HFA;VENTOLIN HFA) 108 (90 Base) MCG/ACT inhaler Inhale 1-2 puffs into the lungs every 6 (six) hours as needed for wheezing or shortness of breath.    . AMBULATORY NON FORMULARY MEDICATION Nitroglycerin ointment 0.125%- apply a pea size amount to your rectum three times daily for 6-8 weeks. 30 g 0  . aspirin 81 MG chewable tablet Chew 1 tablet (81 mg total) by mouth daily. 30 tablet 3  . atorvastatin (LIPITOR) 20 MG tablet Take 1 tablet (20 mg total) by mouth daily. 90 tablet 3  .  bictegravir-emtricitabine-tenofovir AF (BIKTARVY) 50-200-25 MG TABS tablet Take 1 tablet by mouth daily. 30 tablet 5  . bisoprolol (ZEBETA) 5 MG tablet Take 1 tablet (5 mg total) by mouth daily. 90 tablet 3  . furosemide (LASIX) 80 MG tablet Take 1 tablet (80 mg total) by mouth daily. Take one TAB every morning. 30 tablet 3  . sacubitril-valsartan (ENTRESTO) 97-103 MG Take 1 tablet by mouth 2 (two) times daily. 60 tablet 11  . spironolactone (ALDACTONE) 25 MG tablet Take 1 tablet (25 mg total) by mouth daily. 30 tablet 3  . hydrocortisone (ANUSOL-HC) 2.5 % rectal cream Place 1 application rectally 2 (two) times daily. (Patient not taking: Reported on 12/29/2018) 30 g 0   No current facility-administered medications for this encounter.     No Known Allergies    Social History   Socioeconomic History  . Marital status: Significant Other    Spouse name: Not on file  . Number of children: 2  . Years of education: 16  . Highest education level: Bachelor's degree (e.g., BA, AB, BS)  Occupational History  . Occupation: Sr Product/process development scientist: [employer]  Social Needs  . Financial resource strain: Not hard at all  . Food insecurity    Worry: Never true    Inability: Never true  . Transportation needs    Medical: Not on file    Non-medical: Not on file  Tobacco Use  .  Smoking status: Former Smoker    Types: Cigarettes    Quit date: 05/31/2018    Years since quitting: 0.5  . Smokeless tobacco: Never Used  Substance and Sexual Activity  . Alcohol use: Yes    Alcohol/week: 3.0 standard drinks    Types: 3 Cans of beer per week    Frequency: Never    Comment: socially   . Drug use: Not Currently  . Sexual activity: Yes    Partners: Female  Lifestyle  . Physical activity    Days per week: 1 day    Minutes per session: 30 min  . Stress: Not at all  Relationships  . Social Musician on phone: Once a week    Gets together: Once a week    Attends  religious service: Never    Active member of club or organization: Yes    Attends meetings of clubs or organizations: 1 to 4 times per year    Relationship status: Never married  . Intimate partner violence    Fear of current or ex partner: No    Emotionally abused: No    Physically abused: Not on file    Forced sexual activity: No  Other Topics Concern  . Not on file  Social History Narrative  . Not on file      Family History  Problem Relation Age of Onset  . Hypertension Mother   . Diabetes Mellitus II Father   . CAD Father        Died at 66 of 'CAD' while sleeping   . Obesity Sister   . Colon cancer Neg Hx   . Esophageal cancer Neg Hx     Vitals:   12/29/18 1124  BP: 118/84  Pulse: 89  SpO2: 96%  Weight: (!) 175.2 kg (386 lb 3.2 oz)   Wt Readings from Last 3 Encounters:  12/29/18 (!) 175.2 kg (386 lb 3.2 oz)  12/17/18 (!) 175.1 kg (386 lb)  12/15/18 (!) 174.2 kg (384 lb)     PHYSICAL EXAM: General:  Well appearing. No resp difficulty HEENT: normal Neck: supple. no JVD. Carotids 2+ bilat; no bruits. No lymphadenopathy or thryomegaly appreciated. Cor: PMI nondisplaced. Regular rate & rhythm. No rubs, gallops or murmurs. Lungs: clear Abdomen: obese, soft, nontender, nondistended. No hepatosplenomegaly. No bruits or masses. Good bowel sounds. Extremities: no cyanosis, clubbing, rash, edema Neuro: alert & orientedx3, cranial nerves grossly intact. moves all 4 extremities w/o difficulty. Affect pleasant  ASSESSMENT & PLAN: 1. Chronic Systolic HF ECHO 04/23/4823   EF 15-20% .Had 08/2018 RHC/LHC with NICM and single vessel disease 1st diagonal 70% .  - He has not had CMRI but I am not sure he would fit in the MRI scanner. Hold off for now.  NYHA II. Volume status stable.  -  Continue lasix 80 mg daily.  - Continue entresto 97-103 twice a day. - Continue spironolactone 25 mg daily.   -Increase bisoprolol to 7.5 mg daily.   - Consider bidil down the road but not  sure BP will tolerate.  - Plan to repeat ECHO in October.   2. CAD Single vessel disease - LHC 1st diagonal  70%.  - LFTs elevate during his hospitalization.  -LFTs normalized 7/13.  - No chest pain.  -Continue 81 mg aspirin daily.  - Continue  atorvastatin 20 mg daily.   3. HTN  -Stable.   4. Morbid Obesity  Body mass index is 55.41 kg/m.  Discussed portion control.  5.HIV +  Now followed by ID. On biktarvy.  6  ETOH abuse Discussed that he needs to avoid alcohol     7. Severe Sleep Apnea.  [facility] Sleep study showed --> AHI 32.9 nocturna hypoxia. -Continue CPAP nightly.  - He has follow up with Dr Mayford Knife.    Follow up in October with Dr Shirlee Latch and an ECHO. Discussed portion control and medication changes.   Ivry Pigue NP-C  11:27 AM

## 2018-12-29 NOTE — Patient Instructions (Addendum)
Lab work done today. We will notify you of any abnormal lab work. No news is good news!  INCREASE Bisoprolol to 7.5mg  (1.5mg ) daily.  Please follow up with the [facility] in October. You appointment has already been scheduled.  At the [facility], you and your health needs are our priority. As part of our continuing mission to provide you with exceptional heart care, we have created designated Provider Care Teams. These Care Teams include your primary Cardiologist (physician) and Advanced Practice Providers (APPs- Physician Assistants and Nurse Practitioners) who all work together to provide you with the care you need, when you need it.   You may see any of the following providers on your designated Care Team at your next follow up: Marland Kitchen Dr Glori Bickers . Dr Loralie Champagne . Darrick Grinder, NP   Please be sure to bring in all your medications bottles to every appointment.

## 2018-12-29 NOTE — Addendum Note (Signed)
Encounter addended by: Marlise Eves, RN on: 12/29/2018 11:44 AM  Actions taken: Clinical Note Signed

## 2019-01-06 ENCOUNTER — Ambulatory Visit: Payer: BC Managed Care – PPO | Admitting: Gastroenterology

## 2019-01-07 ENCOUNTER — Ambulatory Visit: Payer: Self-pay | Admitting: Family Medicine

## 2019-01-10 NOTE — Progress Notes (Signed)
This encounter was created in error - please disregard.

## 2019-01-11 ENCOUNTER — Encounter: Payer: BC Managed Care – PPO | Admitting: Cardiology

## 2019-01-11 ENCOUNTER — Other Ambulatory Visit: Payer: Self-pay

## 2019-01-11 ENCOUNTER — Telehealth: Payer: Self-pay | Admitting: *Deleted

## 2019-01-11 DIAGNOSIS — I10 Essential (primary) hypertension: Secondary | ICD-10-CM

## 2019-01-11 DIAGNOSIS — G4733 Obstructive sleep apnea (adult) (pediatric): Secondary | ICD-10-CM

## 2019-01-11 NOTE — Telephone Encounter (Signed)
Pt has telemedicine visit with Dr Radford Pax scheduled for 9:40 today. I have tried several times to reach him.  Left message on voicemail to call office.

## 2019-01-25 ENCOUNTER — Encounter (HOSPITAL_COMMUNITY): Payer: Self-pay | Admitting: Cardiology

## 2019-01-25 ENCOUNTER — Other Ambulatory Visit: Payer: Self-pay

## 2019-01-25 ENCOUNTER — Ambulatory Visit (HOSPITAL_COMMUNITY)
Admission: RE | Admit: 2019-01-25 | Discharge: 2019-01-25 | Disposition: A | Discharge status: Home / Self Care | Payer: BC Managed Care – PPO | Source: Ambulatory Visit | Attending: Cardiology | Admitting: Cardiology

## 2019-01-25 ENCOUNTER — Ambulatory Visit (HOSPITAL_BASED_OUTPATIENT_CLINIC_OR_DEPARTMENT_OTHER)
Admission: RE | Admit: 2019-01-25 | Discharge: 2019-01-25 | Disposition: A | Discharge status: Home / Self Care | Payer: BC Managed Care – PPO | Source: Ambulatory Visit | Attending: Cardiology | Admitting: Cardiology

## 2019-01-25 ENCOUNTER — Telehealth (HOSPITAL_COMMUNITY): Payer: Self-pay

## 2019-01-25 VITALS — BP 130/83 | HR 74 | Wt 384.4 lb

## 2019-01-25 DIAGNOSIS — I2510 Atherosclerotic heart disease of native coronary artery without angina pectoris: Secondary | ICD-10-CM | POA: Diagnosis not present

## 2019-01-25 DIAGNOSIS — I5022 Chronic systolic (congestive) heart failure: Secondary | ICD-10-CM

## 2019-01-25 DIAGNOSIS — J45909 Unspecified asthma, uncomplicated: Secondary | ICD-10-CM | POA: Diagnosis not present

## 2019-01-25 DIAGNOSIS — I083 Combined rheumatic disorders of mitral, aortic and tricuspid valves: Secondary | ICD-10-CM | POA: Insufficient documentation

## 2019-01-25 DIAGNOSIS — I428 Other cardiomyopathies: Secondary | ICD-10-CM | POA: Diagnosis not present

## 2019-01-25 DIAGNOSIS — Z7982 Long term (current) use of aspirin: Secondary | ICD-10-CM | POA: Insufficient documentation

## 2019-01-25 DIAGNOSIS — Z79899 Other long term (current) drug therapy: Secondary | ICD-10-CM | POA: Insufficient documentation

## 2019-01-25 DIAGNOSIS — Z8249 Family history of ischemic heart disease and other diseases of the circulatory system: Secondary | ICD-10-CM | POA: Insufficient documentation

## 2019-01-25 DIAGNOSIS — G4733 Obstructive sleep apnea (adult) (pediatric): Secondary | ICD-10-CM | POA: Insufficient documentation

## 2019-01-25 DIAGNOSIS — Z6841 Body Mass Index (BMI) 40.0 and over, adult: Secondary | ICD-10-CM | POA: Insufficient documentation

## 2019-01-25 DIAGNOSIS — I110 Hypertensive heart disease with heart failure: Secondary | ICD-10-CM | POA: Diagnosis not present

## 2019-01-25 LAB — COMPREHENSIVE METABOLIC PANEL
ALT: 33 U/L (ref 0–44)
AST: 27 U/L (ref 15–41)
Albumin: 3.7 g/dL (ref 3.5–5.0)
Alkaline Phosphatase: 73 U/L (ref 38–126)
Anion gap: 8 (ref 5–15)
BUN: 22 mg/dL — ABNORMAL HIGH (ref 6–20)
CO2: 24 mmol/L (ref 22–32)
Calcium: 9.2 mg/dL (ref 8.9–10.3)
Chloride: 106 mmol/L (ref 98–111)
Creatinine, Ser: 1.4 mg/dL — ABNORMAL HIGH (ref 0.61–1.24)
GFR calc Af Amer: >60 mL/min (ref >60)
GFR calc non Af Amer: >60 mL/min (ref >60)
Glucose, Bld: 95 mg/dL (ref 70–99)
Potassium: 4.1 mmol/L (ref 3.5–5.1)
Sodium: 138 mmol/L (ref 135–145)
Total Bilirubin: 1.2 mg/dL (ref 0.3–1.2)
Total Protein: 7.5 g/dL (ref 6.5–8.1)

## 2019-01-25 LAB — LIPID PANEL
Cholesterol: 144 mg/dL (ref 0–200)
HDL: 26 mg/dL — ABNORMAL LOW (ref >40)
LDL Cholesterol: 73 mg/dL (ref 0–99)
Total CHOL/HDL Ratio: 5.5 RATIO
Triglycerides: 226 mg/dL — ABNORMAL HIGH (ref <150)
VLDL: 45 mg/dL — ABNORMAL HIGH (ref 0–40)

## 2019-01-25 MED ORDER — ISOSORB DINITRATE-HYDRALAZINE 20-37.5 MG PO TABS: 1.0000 | ORAL_TABLET | Freq: Three times a day (TID) | ORAL | 6 refills | Status: DC

## 2019-01-25 NOTE — Patient Instructions (Signed)
START Bidil (1 tab) three times a day  Labs today We will only contact you if something comes back abnormal or we need to make some changes. Otherwise no news is good news!  Your physician recommends that you schedule a follow-up appointment in: 3 weeks with Pharmacist and 6 weeks with Dr Aundra Dubin  At the [facility], you and your health needs are our priority. As part of our continuing mission to provide you with exceptional heart care, we have created designated Provider Care Teams. These Care Teams include your primary Cardiologist (physician) and Advanced Practice Providers (APPs- Physician Assistants and Nurse Practitioners) who all work together to provide you with the care you need, when you need it.   You may see any of the following providers on your designated Care Team at your next follow up: Marland Kitchen Dr Glori Bickers . Dr Loralie Champagne . Darrick Grinder, NP . Lyda Jester, PA   Please be sure to bring in all your medications bottles to every appointment.

## 2019-01-25 NOTE — Progress Notes (Signed)
Echocardiogram 2D Echocardiogram has been performed.  Todd Griffith 01/25/2019, 2:05 PM

## 2019-01-25 NOTE — Telephone Encounter (Signed)
-----   Message from Larey Dresser, MD sent at 01/25/2019  4:03 PM EDT ----- Good LDL.  Watch diet for triglycerides.

## 2019-01-25 NOTE — Progress Notes (Signed)
PCP: Patient, No Pcp Per Cardiology: Dr. Shirlee Latch  28 y.o. with history of asthma, OSA, HIV+, and nonischemic cardiomyopathy returns for followup of CHF.  He was admitted in 6/20 with dyspnea and found to have CHF.  Echo showed EF 15-20% with diffuse hypokinesis, moderate-severe MR. LHC/RHC showed mild coronary disease, preserved cardiac output.  He was also diagnosed with HIV and started on Biktarvy.   Echo was done today and reviewed, EF up to 35-40%, MR is only trivial.   Weight is down 2 lbs. Symptomatically, doing well.  Dyspnea only with heavy activity.  He is going to the gym, walks 1 mile on the treadmill in about 30 minutes and uses free weights.  No lightheadedness.  No chest pain.  No orthopnea/PND.   ECG (7/20, personally reviewed): NSR, LVH, normal QRS width.   Labs (9/20): K 4.2, creatinine 1.48  PMH: 1. Chronic systolic CHF: Nonischemic cardiomyopathy.   - LHC/RHC (6/20): mean RA 4, mean PCWP 21, CI 3; 70% D1 stenosis.  - Echo (6/20): EF 15-20%, normal RV, moderate-severe MR.  - Echo (10/20): EF 35-40% with diffuse hypokinesis, normal RV, trivial MR.  2. HIV+: Followed by ID.  3. H/o ETOH abuse.  4. Asthma: Since childhood.  5. HTN 6. Morbid obesity.  7. OSA: Uses CPAP.  8. CAD: LHC in 6/20 with 70% D1 stenosis.   FH: Father, grandf ather with CAD, no cardiomyopathy.   SH: Lives in [place] with girlfriend and child, works for [employer] of [place].  Nonsmoker.  Prior heavy ETOH, now drinks some on weekend but much less. Nonsmoker, no drugs.   ROS: All systems reviewed and negative except as per HPI.   Current Outpatient Medications  Medication Sig Dispense Refill  . albuterol (PROVENTIL HFA;VENTOLIN HFA) 108 (90 Base) MCG/ACT inhaler Inhale 1-2 puffs into the lungs every 6 (six) hours as needed for wheezing or shortness of breath.    . AMBULATORY NON FORMULARY MEDICATION Nitroglycerin ointment 0.125%- apply a pea size amount to your rectum three times daily for 6-8 weeks.  30 g 0  . aspirin 81 MG chewable tablet Chew 1 tablet (81 mg total) by mouth daily. 30 tablet 3  . atorvastatin (LIPITOR) 20 MG tablet Take 1 tablet (20 mg total) by mouth daily. 90 tablet 3  . bictegravir-emtricitabine-tenofovir AF (BIKTARVY) 50-200-25 MG TABS tablet Take 1 tablet by mouth daily. 30 tablet 5  . bisoprolol (ZEBETA) 5 MG tablet Take 1.5 tablets (7.5 mg total) by mouth daily. 135 tablet 3  . furosemide (LASIX) 80 MG tablet Take 1 tablet (80 mg total) by mouth daily. Take one TAB every morning. 30 tablet 3  . sacubitril-valsartan (ENTRESTO) 97-103 MG Take 2 tablets by mouth 2 (two) times daily.    Marland Kitchen spironolactone (ALDACTONE) 25 MG tablet Take 1 tablet (25 mg total) by mouth daily. 30 tablet 3  . isosorbide-hydrALAZINE (BIDIL) 20-37.5 MG tablet Take 1 tablet by mouth 3 (three) times daily. 90 tablet 6   No current facility-administered medications for this encounter.    BP 130/83   Pulse 74   Wt (!) 174.4 kg (384 lb 6.4 oz)   SpO2 100%   BMI 55.16 kg/m  General: NAD, obese.  Neck: Thick, no JVD, no thyromegaly or thyroid nodule.  Lungs: Clear to auscultation bilaterally with normal respiratory effort. CV: Nondisplaced PMI.  Heart regular S1/S2, no S3/S4, no murmur.  No peripheral edema.  No carotid bruit.  Normal pedal pulses.  Abdomen: Soft, nontender, no hepatosplenomegaly,  no distention.  Skin: Intact without lesions or rashes.  Neurologic: Alert and oriented x 3.  Psych: Normal affect. Extremities: No clubbing or cyanosis.  HEENT: Normal.   Assessment/Plan: 1. Chronic systolic CHF: Nonischemic cardiomyopathy.  Echo in 6/20 showed EF 15-20% with moderate-severe MR.  RHC/LHC indicated nonischemic cardiomyopathy, cardiac output preserved. Cause of cardiomyopathy is uncertain, he was a heavy drinker in the past.  He has also been found to have HIV, which can cause a cardiomyopathy.  Today's echo was reviewed, EF improved up to 35-40%.  On exam, he is not volume overloaded.   NYHA class II.  - Continue bisoprolol 7.5 mg daily.  - Continue Entresto 97/103 bid.  - Continue spironolactone 25 mg daily.  - Add Bidil 1 tab tid.  - He can continue current Lasix, 80 mg daily.  BMET today.  - I think that he is just out of the range for ICD. Repeat echo in 4 months.  - I do not think that he would fit in scanner for cardiac MRI.  2. CAD: LHC (6/20) showed 70% D1 stenosis.   - Continue ASA 81 daily.  - Continue atorvastatin 20 mg daily. Check lipids today.  3. Mitral regurgitation: This seems much improved on today's echo, trivial.  4. ETOH abuse: He has cut back considerably.  Prior ETOH abuse may have contributed to his cardiomyopathy.  - I strongly encouraged cut back further on ETOH.  5. OSA: Continue CPAP.  6. Obesity: I will refer him to [facility] for weight loss assistance.   Loralie Champagne 01/25/2019

## 2019-01-25 NOTE — Telephone Encounter (Signed)
Pt aware of lab results and recommendations. Verbalized understanding.  

## 2019-01-26 ENCOUNTER — Ambulatory Visit: Payer: Self-pay | Admitting: Family Medicine

## 2019-02-09 ENCOUNTER — Other Ambulatory Visit: Payer: BC Managed Care – PPO

## 2019-02-09 ENCOUNTER — Other Ambulatory Visit: Payer: Self-pay

## 2019-02-09 DIAGNOSIS — B20 Human immunodeficiency virus [HIV] disease: Secondary | ICD-10-CM

## 2019-02-10 LAB — T-HELPER CELL (CD4) - (RCID CLINIC ONLY)
CD4 % Helper T Cell: 39 % (ref 33–65)
CD4 T Cell Abs: 1272 /uL (ref 400–1790)

## 2019-02-12 LAB — COMPREHENSIVE METABOLIC PANEL
AG Ratio: 1.4 (calc) (ref 1.0–2.5)
ALT: 31 U/L (ref 9–46)
AST: 23 U/L (ref 10–40)
Albumin: 4 g/dL (ref 3.6–5.1)
Alkaline phosphatase (APISO): 70 U/L (ref 36–130)
BUN/Creatinine Ratio: 14 (calc) (ref 6–22)
BUN: 19 mg/dL (ref 7–25)
CO2: 27 mmol/L (ref 20–32)
Calcium: 9.3 mg/dL (ref 8.6–10.3)
Chloride: 104 mmol/L (ref 98–110)
Creat: 1.39 mg/dL — ABNORMAL HIGH (ref 0.60–1.35)
Globulin: 2.9 g/dL (calc) (ref 1.9–3.7)
Glucose, Bld: 87 mg/dL (ref 65–99)
Potassium: 4.1 mmol/L (ref 3.5–5.3)
Sodium: 139 mmol/L (ref 135–146)
Total Bilirubin: 1 mg/dL (ref 0.2–1.2)
Total Protein: 6.9 g/dL (ref 6.1–8.1)

## 2019-02-12 LAB — HIV-1 RNA QUANT-NO REFLEX-BLD
HIV 1 RNA Quant: <20 copies/mL
HIV-1 RNA Quant, Log: <1.3 Log copies/mL

## 2019-02-15 ENCOUNTER — Inpatient Hospital Stay (HOSPITAL_COMMUNITY)
Admission: RE | Admit: 2019-02-15 | Discharge: 2019-02-15 | Disposition: A | Discharge status: Home / Self Care | Payer: BC Managed Care – PPO | Source: Ambulatory Visit

## 2019-02-16 ENCOUNTER — Other Ambulatory Visit (HOSPITAL_COMMUNITY): Payer: Self-pay | Admitting: Adult Health

## 2019-02-17 ENCOUNTER — Encounter (HOSPITAL_COMMUNITY): Payer: Self-pay

## 2019-02-22 ENCOUNTER — Encounter (HOSPITAL_COMMUNITY): Payer: Self-pay | Admitting: *Deleted

## 2019-02-23 ENCOUNTER — Ambulatory Visit (INDEPENDENT_AMBULATORY_CARE_PROVIDER_SITE_OTHER): Payer: BC Managed Care – PPO | Admitting: Family

## 2019-02-23 ENCOUNTER — Other Ambulatory Visit: Payer: Self-pay

## 2019-02-23 ENCOUNTER — Encounter: Payer: Self-pay | Admitting: Family

## 2019-02-23 VITALS — BP 148/81 | HR 82 | Wt 373.0 lb

## 2019-02-23 DIAGNOSIS — Z0000 Encounter for general adult medical examination without abnormal findings: Secondary | ICD-10-CM | POA: Diagnosis not present

## 2019-02-23 DIAGNOSIS — B20 Human immunodeficiency virus [HIV] disease: Secondary | ICD-10-CM

## 2019-02-23 DIAGNOSIS — Z23 Encounter for immunization: Secondary | ICD-10-CM

## 2019-02-23 MED ORDER — BIKTARVY 50-200-25 MG PO TABS: 1.0000 | ORAL_TABLET | Freq: Every day | ORAL | 5 refills | Status: DC

## 2019-02-23 NOTE — Assessment & Plan Note (Signed)
Todd Griffith is down 13 pounds since his previous office visit.  Encouraged to continue with nutritional changes and physical activity increases as tolerated.

## 2019-02-23 NOTE — Patient Instructions (Addendum)
Nice to see you.  You are doing an awesome job - keep up the great work.  Continue to take your [place] daily.   Refills of medication have been sent to the pharmacy.   If you are interested in counseling you have several options including your employee assistance, our phone service [phone] Ext [phone] for intake and Marylu Lund our counselor in person.   Plan for follow up in 3 months or sooner if needed with lab work 1-2 weeks prior to your appointment.   Please let us know if you have any questions.   Have a great day and stay safe!  Happy Holidays!    Hepatitis B Vaccine, Recombinant injection What is this medicine? HEPATITIS B VACCINE (hep uh TAHY tis B VAK seen) is a vaccine. It is used to prevent an infection with the hepatitis B virus. This medicine may be used for other purposes; ask your health care provider or pharmacist if you have questions. COMMON BRAND NAME(S): Engerix-B, Recombivax HB What should I tell my health care provider before I take this medicine? They need to know if you have any of these conditions:  fever, infection  heart disease  hepatitis B infection  immune system problems  kidney disease  an unusual or allergic reaction to vaccines, yeast, other medicines, foods, dyes, or preservatives  pregnant or trying to get pregnant  breast-feeding How should I use this medicine? This vaccine is for injection into a muscle. It is given by a health care professional. A copy of Vaccine Information Statements will be given before each vaccination. Read this sheet carefully each time. The sheet may change frequently. Talk to your pediatrician regarding the use of this medicine in children. While this drug may be prescribed for children as young as newborn for selected conditions, precautions do apply. Overdosage: If you think you have taken too much of this medicine contact a poison control center or emergency room at once. NOTE: This medicine is only for  you. Do not share this medicine with others. What if I miss a dose? It is important not to miss your dose. Call your doctor or health care professional if you are unable to keep an appointment. What may interact with this medicine?  medicines that suppress your immune function like adalimumab, anakinra, infliximab  medicines to treat cancer  steroid medicines like prednisone or cortisone This list may not describe all possible interactions. Give your health care provider a list of all the medicines, herbs, non-prescription drugs, or dietary supplements you use. Also tell them if you smoke, drink alcohol, or use illegal drugs. Some items may interact with your medicine. What should I watch for while using this medicine? See your health care provider for all shots of this vaccine as directed. You must have 3 shots of this vaccine for protection from hepatitis B infection. Tell your doctor right away if you have any serious or unusual side effects after getting this vaccine. What side effects may I notice from receiving this medicine? Side effects that you should report to your doctor or health care professional as soon as possible:  allergic reactions like skin rash, itching or hives, swelling of the face, lips, or tongue  breathing problems  confused, irritated  fast, irregular heartbeat  flu-like syndrome  numb, tingling pain  seizures  unusually weak or tired Side effects that usually do not require medical attention (report to your doctor or health care professional if they continue or are bothersome):  diarrhea  fever  headache  loss of appetite  muscle pain  nausea  pain, redness, swelling, or irritation at site where injected  tiredness This list may not describe all possible side effects. Call your doctor for medical advice about side effects. You may report side effects to FDA at 1-800-FDA-1088. Where should I keep my medicine? This drug is given in a hospital  or clinic and will not be stored at home. NOTE: This sheet is a summary. It may not cover all possible information. If you have questions about this medicine, talk to your doctor, pharmacist, or health care provider.  2020 Elsevier/Gold Standard (2013-07-19 13:26:01)

## 2019-02-23 NOTE — Addendum Note (Signed)
Addended by: Reggy Eye on: 02/23/2019 01:56 PM   Modules accepted: Orders

## 2019-02-23 NOTE — Assessment & Plan Note (Signed)
   Pneumovax updated today.  Due for hepatitis B and Menveo at next office visit.  Discussed importance of safe sexual practice to reduce risk of STI.  Condoms provided.

## 2019-02-23 NOTE — Assessment & Plan Note (Signed)
Todd Griffith has well-controlled HIV disease with good adherence and tolerance to his ART regimen of Biktarvy.  No signs/symptoms of opportunistic infection or progressive HIV disease.  He has no problems obtaining his medication from the pharmacy.  Reviewed previous blood work and plan of care with questions answered.  Continue current dose of Biktarvy.  Plan for follow-up in 3 months or sooner if needed with lab work 1 to 2 weeks prior to appointment.

## 2019-02-23 NOTE — Progress Notes (Signed)
Subjective:    Patient ID: Todd Griffith, male    DOB: 22-Sep-1990, 28 y.o.   MRN: [id number]  Chief Complaint  Patient presents with  . Follow-up     HPI:  Todd Griffith is a 28 y.o. male with HIV disease who was last seen in the office on 11/09/2018 with good adherence and tolerance to his ART regimen of Biktarvy with a viral load that was undetectable and CD4 count of 999.  Most recent blood work completed on 02/09/2019 with CD4 count of 1272 and viral load that remains undetectable.  Renal function is stable at 1.39 with hepatic function and electrolytes within normal ranges.  Healthcare maintenance due includes Pneumovax, influenza, Menveo, and hepatitis B.  Todd Griffith to take his Biktarvy as prescribed with no adverse side effects or missed doses since his last office visit.  Overall feeling well today with no new concerns/complaints. Denies fevers, chills, night sweats, headaches, changes in vision, neck pain/stiffness, nausea, diarrhea, vomiting, lesions or rashes.  Todd Griffith remains covered through [facility] and has no problems obtaining his medication from the pharmacy.  Has been having feelings of being down at times as he Griffith to adjust to his new diagnosis.  He would like to pursue counseling if possible.  No recreational or illicit drug use, tobacco use, or alcohol consumption at present.  He remains sexually active in a monogamous relationship with no new partners.  He does use condoms as his partner has been HIV negative.   No Known Allergies    Outpatient Medications Prior to Visit  Medication Sig Dispense Refill  . albuterol (PROVENTIL HFA;VENTOLIN HFA) 108 (90 Base) MCG/ACT inhaler Inhale 1-2 puffs into the lungs every 6 (six) hours as needed for wheezing or shortness of breath.    . AMBULATORY NON FORMULARY MEDICATION Nitroglycerin ointment 0.125%- apply a pea size amount to your rectum three times daily for 6-8 weeks. 30 g  0  . aspirin 81 MG chewable tablet Chew 1 tablet (81 mg total) by mouth daily. 30 tablet 3  . bisoprolol (ZEBETA) 5 MG tablet Take 1.5 tablets (7.5 mg total) by mouth daily. 135 tablet 3  . isosorbide-hydrALAZINE (BIDIL) 20-37.5 MG tablet Take 1 tablet by mouth 3 (three) times daily. 90 tablet 6  . sacubitril-valsartan (ENTRESTO) 97-103 MG Take 2 tablets by mouth 2 (two) times daily.    Marland Kitchen spironolactone (ALDACTONE) 25 MG tablet TAKE 1 TABLET BY MOUTH EVERY DAY 90 tablet 1  . bictegravir-emtricitabine-tenofovir AF (BIKTARVY) 50-200-25 MG TABS tablet Take 1 tablet by mouth daily. 30 tablet 5  . atorvastatin (LIPITOR) 20 MG tablet Take 1 tablet (20 mg total) by mouth daily. 90 tablet 3  . furosemide (LASIX) 80 MG tablet Take 1 tablet (80 mg total) by mouth daily. Take one TAB every morning. 30 tablet 3   No facility-administered medications prior to visit.      Past Medical History:  Diagnosis Date  . Asthma   . Bronchitis   . HIV (human immunodeficiency virus infection) (HCC)      Past Surgical History:  Procedure Laterality Date  . RIGHT/LEFT HEART CATH AND CORONARY ANGIOGRAPHY N/A 09/21/2018   Procedure: RIGHT/LEFT HEART CATH AND CORONARY ANGIOGRAPHY;  Surgeon: Marykay Lex, MD;  Location: [facility] INVASIVE CV LAB;  Service: Cardiovascular;  Laterality: N/A;       Review of Systems  Constitutional: Negative for appetite change, chills, fatigue, fever and unexpected weight change.  Eyes: Negative for visual  disturbance.  Respiratory: Negative for cough, chest tightness, shortness of breath and wheezing.   Cardiovascular: Negative for chest pain and leg swelling.  Gastrointestinal: Negative for abdominal pain, constipation, diarrhea, nausea and vomiting.  Genitourinary: Negative for dysuria, flank pain, frequency, genital sores, hematuria and urgency.  Skin: Negative for rash.  Allergic/Immunologic: Negative for immunocompromised state.  Neurological: Negative for dizziness and  headaches.      Objective:    BP (!) 148/81   Pulse 82   Wt (!) 373 lb (169.2 kg)   SpO2 97%   BMI 53.52 kg/m  Nursing note and vital signs reviewed.   Physical Exam Constitutional:      General: He is not in acute distress.    Appearance: He is well-developed.  Eyes:     Conjunctiva/sclera: Conjunctivae normal.  Neck:     Musculoskeletal: Neck supple.  Cardiovascular:     Rate and Rhythm: Normal rate and regular rhythm.     Heart sounds: Normal heart sounds. No murmur. No friction rub. No gallop.   Pulmonary:     Effort: Pulmonary effort is normal. No respiratory distress.     Breath sounds: Normal breath sounds. No wheezing or rales.  Chest:     Chest wall: No tenderness.  Abdominal:     General: Bowel sounds are normal.     Palpations: Abdomen is soft.     Tenderness: There is no abdominal tenderness.  Lymphadenopathy:     Cervical: No cervical adenopathy.  Skin:    General: Skin is warm and dry.     Findings: No rash.  Neurological:     Mental Status: He is alert and oriented to person, place, and time.  Psychiatric:        Behavior: Behavior normal.        Thought Content: Thought content normal.        Judgment: Judgment normal.    Wt Readings from Last 3 Encounters:  02/23/19 (!) 373 lb (169.2 kg)  01/25/19 (!) 384 lb 6.4 oz (174.4 kg)  12/29/18 (!) 386 lb 3.2 oz (175.2 kg)     Depression screen [facility] 2/9 02/23/2019 11/09/2018 11/05/2018 10/07/2018  Decreased Interest 0 0 1 0  Down, Depressed, Hopeless 0 0 0 0  PHQ - 2 Score 0 0 1 0       Assessment & Plan:    Patient Active Problem List   Diagnosis Date Noted  . Healthcare maintenance 11/09/2018  . HIV disease (HCC) 09/22/2018  . Dilated cardiomyopathy (HCC) 09/21/2018  . Acute combined systolic and diastolic heart failure (HCC)   . Morbid obesity (HCC)   . Volume overload 09/17/2018  . Peripheral edema   . Orthopnea   . Cardiomegaly   . Uncomplicated asthma      Problem List Items  Addressed This Visit      Other   Morbid obesity [facility])    Todd Griffith is down 13 pounds since his previous office visit.  Encouraged to continue with nutritional changes and physical activity increases as tolerated.      HIV disease [facility])    Todd Griffith has well-controlled HIV disease with good adherence and tolerance to his ART regimen of Biktarvy.  No signs/symptoms of opportunistic infection or progressive HIV disease.  He has no problems obtaining his medication from the pharmacy.  Reviewed previous blood work and plan of care with questions answered.  Continue current dose of Biktarvy.  Plan for follow-up in 3 months or sooner if needed  with lab work 1 to 2 weeks prior to appointment.      Relevant Medications   bictegravir-emtricitabine-tenofovir AF (BIKTARVY) 50-200-25 MG TABS tablet   Other Relevant Orders   3 month T-helper cell (CD4)- ([facility] clinic only)   3 month VL   3 month CMP   3 month RPR   Urine cytology ancillary only([facility])   Healthcare maintenance     Pneumovax updated today.  Due for hepatitis B and Menveo at next office visit.  Discussed importance of safe sexual practice to reduce risk of STI.  Condoms provided.          I am having Todd Griffith maintain his albuterol, aspirin, furosemide, atorvastatin, AMBULATORY NON FORMULARY MEDICATION, bisoprolol, Entresto, isosorbide-hydrALAZINE, spironolactone, and Biktarvy.   Meds ordered this encounter  Medications  . bictegravir-emtricitabine-tenofovir AF (BIKTARVY) 50-200-25 MG TABS tablet    Sig: Take 1 tablet by mouth daily.    Dispense:  30 tablet    Refill:  5    Copay card: [id number] PCN: ACCESS [id number] ID: [id number]    Order Specific Question:   Supervising Provider    Answer:   Carlyle Basques [[id number]]     Follow-up: Return in about 3 months (around 05/26/2019), or if symptoms worsen or fail to improve.   Terri Piedra, MSN, FNP-C Nurse Practitioner [facility] [place] number: [phone]

## 2019-03-10 ENCOUNTER — Encounter (HOSPITAL_COMMUNITY): Payer: Self-pay | Admitting: Cardiology

## 2019-03-10 ENCOUNTER — Ambulatory Visit (HOSPITAL_COMMUNITY)
Admission: RE | Admit: 2019-03-10 | Discharge: 2019-03-10 | Disposition: A | Discharge status: Home / Self Care | Payer: BC Managed Care – PPO | Source: Ambulatory Visit | Attending: Cardiology | Admitting: Cardiology

## 2019-03-10 ENCOUNTER — Other Ambulatory Visit: Payer: Self-pay

## 2019-03-10 VITALS — BP 141/70 | HR 77 | Wt 390.0 lb

## 2019-03-10 DIAGNOSIS — I420 Dilated cardiomyopathy: Secondary | ICD-10-CM

## 2019-03-10 DIAGNOSIS — G4733 Obstructive sleep apnea (adult) (pediatric): Secondary | ICD-10-CM | POA: Diagnosis not present

## 2019-03-10 DIAGNOSIS — I5022 Chronic systolic (congestive) heart failure: Secondary | ICD-10-CM | POA: Diagnosis not present

## 2019-03-10 DIAGNOSIS — I340 Nonrheumatic mitral (valve) insufficiency: Secondary | ICD-10-CM | POA: Diagnosis not present

## 2019-03-10 DIAGNOSIS — Z7982 Long term (current) use of aspirin: Secondary | ICD-10-CM | POA: Insufficient documentation

## 2019-03-10 DIAGNOSIS — F1010 Alcohol abuse, uncomplicated: Secondary | ICD-10-CM | POA: Insufficient documentation

## 2019-03-10 DIAGNOSIS — Z79899 Other long term (current) drug therapy: Secondary | ICD-10-CM | POA: Insufficient documentation

## 2019-03-10 DIAGNOSIS — I2510 Atherosclerotic heart disease of native coronary artery without angina pectoris: Secondary | ICD-10-CM | POA: Insufficient documentation

## 2019-03-10 DIAGNOSIS — I428 Other cardiomyopathies: Secondary | ICD-10-CM | POA: Insufficient documentation

## 2019-03-10 DIAGNOSIS — Z6841 Body Mass Index (BMI) 40.0 and over, adult: Secondary | ICD-10-CM | POA: Insufficient documentation

## 2019-03-10 DIAGNOSIS — Z21 Asymptomatic human immunodeficiency virus [HIV] infection status: Secondary | ICD-10-CM | POA: Diagnosis not present

## 2019-03-10 DIAGNOSIS — J45909 Unspecified asthma, uncomplicated: Secondary | ICD-10-CM | POA: Diagnosis not present

## 2019-03-10 DIAGNOSIS — I110 Hypertensive heart disease with heart failure: Secondary | ICD-10-CM | POA: Insufficient documentation

## 2019-03-10 LAB — BASIC METABOLIC PANEL
Anion gap: 6 (ref 5–15)
BUN: 13 mg/dL (ref 6–20)
CO2: 25 mmol/L (ref 22–32)
Calcium: 9.5 mg/dL (ref 8.9–10.3)
Chloride: 105 mmol/L (ref 98–111)
Creatinine, Ser: 1.43 mg/dL — ABNORMAL HIGH (ref 0.61–1.24)
GFR calc Af Amer: >60 mL/min (ref >60)
GFR calc non Af Amer: >60 mL/min (ref >60)
Glucose, Bld: 90 mg/dL (ref 70–99)
Potassium: 4.2 mmol/L (ref 3.5–5.1)
Sodium: 136 mmol/L (ref 135–145)

## 2019-03-10 MED ORDER — ISOSORB DINITRATE-HYDRALAZINE 20-37.5 MG PO TABS: 0.5000 | ORAL_TABLET | Freq: Three times a day (TID) | ORAL | 3 refills | Status: DC

## 2019-03-10 NOTE — Patient Instructions (Signed)
START Bidil 1/2 tablet three times daily.  Routine lab work today. Will notify you of abnormal results  Follow up with PharmD in 3 weeks.   Follow up and echo in 2 months.   You have been referred to the bariatric program. (they will contact you to schedule appointment)

## 2019-03-10 NOTE — Progress Notes (Signed)
PCP: Massie Maroon, FNP Cardiology: Dr. Shirlee Latch  28 y.o. with history of asthma, OSA, HIV+, and nonischemic cardiomyopathy returns for followup of CHF.  He was admitted in 6/20 with dyspnea and found to have CHF.  Echo showed EF 15-20% with diffuse hypokinesis, moderate-severe MR. LHC/RHC showed mild coronary disease, preserved cardiac output.  He was also diagnosed with HIV and started on Biktarvy.   Echo in 10/20 showed EF up to 35-40%, MR is only trivial.   Weight is up about 6 lbs today, he thinks this is caloric rather than fluid retention.  He was unable to take Bidil 1 tab tid, says it made his chest hurt.  He continues to drink some on weekends but less than in the past.  No significant dyspnea walking on flat ground.  Working out some in a gym.  No problems with ADLs.  No orthopnea/PND.  No chest pain. He is using his CPAP nightly.   Labs (9/20): K 4.2, creatinine 1.48 Labs (10/20): LDL 73, HDL 26 Labs (11/20): K 4.1, creatinine 1.39  PMH: 1. Chronic systolic CHF: Nonischemic cardiomyopathy.   - LHC/RHC (6/20): mean RA 4, mean PCWP 21, CI 3; 70% D1 stenosis.  - Echo (6/20): EF 15-20%, normal RV, moderate-severe MR.  - Echo (10/20): EF 35-40% with diffuse hypokinesis, normal RV, trivial MR.  2. HIV+: Followed by ID.  3. H/o ETOH abuse.  4. Asthma: Since childhood.  5. HTN 6. Morbid obesity.  7. OSA: Uses CPAP.  8. CAD: LHC in 6/20 with 70% D1 stenosis.   FH: Father, grandf ather with CAD, no cardiomyopathy.   SH: Lives in [place] with girlfriend and child, works for [employer] [place].  Nonsmoker.  Prior heavy ETOH, now drinks some on weekend but much less. Nonsmoker, no drugs.   ROS: All systems reviewed and negative except as per HPI.   Current Outpatient Medications  Medication Sig Dispense Refill  . albuterol (PROVENTIL HFA;VENTOLIN HFA) 108 (90 Base) MCG/ACT inhaler Inhale 1-2 puffs into the lungs every 6 (six) hours as needed for wheezing or shortness of breath.     . AMBULATORY NON FORMULARY MEDICATION Nitroglycerin ointment 0.125%- apply a pea size amount to your rectum three times daily for 6-8 weeks. 30 g 0  . aspirin 81 MG chewable tablet Chew 1 tablet (81 mg total) by mouth daily. 30 tablet 3  . atorvastatin (LIPITOR) 20 MG tablet Take 1 tablet (20 mg total) by mouth daily. 90 tablet 3  . bictegravir-emtricitabine-tenofovir AF (BIKTARVY) 50-200-25 MG TABS tablet Take 1 tablet by mouth daily. 30 tablet 5  . bisoprolol (ZEBETA) 5 MG tablet Take 1.5 tablets (7.5 mg total) by mouth daily. 135 tablet 3  . furosemide (LASIX) 80 MG tablet Take 1 tablet (80 mg total) by mouth daily. Take one TAB every morning. 30 tablet 3  . sacubitril-valsartan (ENTRESTO) 97-103 MG Take 2 tablets by mouth 2 (two) times daily.    Marland Kitchen spironolactone (ALDACTONE) 25 MG tablet TAKE 1 TABLET BY MOUTH EVERY DAY 90 tablet 1  . isosorbide-hydrALAZINE (BIDIL) 20-37.5 MG tablet Take 0.5 tablets by mouth 3 (three) times daily. 45 tablet 3   No current facility-administered medications for this encounter.    BP (!) 141/70   Pulse 77   Wt (!) 176.9 kg (390 lb)   SpO2 99%   BMI 55.96 kg/m  General: NAD, obese.  Neck: Thick, no JVD, no thyromegaly or thyroid nodule.  Lungs: Clear to auscultation bilaterally with normal respiratory  effort. CV: Nondisplaced PMI.  Heart regular S1/S2, no S3/S4, no murmur.  No peripheral edema.  No carotid bruit.  Normal pedal pulses.  Abdomen: Soft, nontender, no hepatosplenomegaly, no distention.  Skin: Intact without lesions or rashes.  Neurologic: Alert and oriented x 3.  Psych: Normal affect. Extremities: No clubbing or cyanosis.  HEENT: Normal.   Assessment/Plan: 1. Chronic systolic CHF: Nonischemic cardiomyopathy.  Echo in 6/20 showed EF 15-20% with moderate-severe MR.  RHC/LHC indicated nonischemic cardiomyopathy, cardiac output preserved. Cause of cardiomyopathy is uncertain, he was a heavy drinker in the past.  He has also been found to have  HIV, which can cause a cardiomyopathy.  Echo in 10/20 showed EF improved up to 35-40%.  On exam, he is not volume overloaded today.  NYHA class II.  - Continue bisoprolol 7.5 mg daily.  - Continue Entresto 97/103 bid.  - Continue spironolactone 25 mg daily.  - He failed Bidil due to ?chest pain.  I will have him retry Bidil at a lower dose, 1/2 tab tid.  - He can continue current Lasix, 80 mg daily.  BMET today.  - I think that he is just out of the range for ICD. Repeat echo at followup in 2/21.   - I do not think that he would fit in scanner for cardiac MRI.  2. CAD: LHC (6/20) showed 70% D1 stenosis.   - Continue ASA 81 daily.  - Continue atorvastatin 20 mg daily. Good lipids in 10/20.  3. Mitral regurgitation: This seems much improved on 10/20 echo, trivial.  4. ETOH abuse: He has cut back considerably.  Prior ETOH abuse may have contributed to his cardiomyopathy.  - I strongly encouraged cut back further on ETOH (quit completely for now).  5. OSA: Continue CPAP.  6. Obesity: He is interested in bariatric surgery.  I will refer to [facility].   Followup HF pharmacist in 3 wks (med titration, make sure tolerates Bidil and can increase bisoprolol).  See me in 2/21 with echo.   Loralie Champagne 03/10/2019

## 2019-03-23 ENCOUNTER — Encounter (HOSPITAL_COMMUNITY): Payer: Self-pay

## 2019-04-01 ENCOUNTER — Inpatient Hospital Stay (HOSPITAL_COMMUNITY): Admission: RE | Admit: 2019-04-01 | Payer: BC Managed Care – PPO | Source: Ambulatory Visit

## 2019-04-05 NOTE — Progress Notes (Signed)
PCP: Dorena Dew, FNP Cardiology: Dr. Aundra Dubin  HPI:  29 y.o. with history of asthma, OSA, HIV+, and nonischemic cardiomyopathy returns for followup of CHF.  He was admitted in 08/2018 with dyspnea and found to have CHF.  Echo showed EF 15-20% with diffuse hypokinesis, moderate-severe MR. LHC/RHC showed mild coronary disease, preserved cardiac output.  He was also diagnosed with HIV and started on Biktarvy.   Echo in 12/2018 showed EF up to 35-40%, MR is only trivial.   Recently returned to HF Clinic for follow up on 03/10/19 with Dr. Aundra Dubin. At that visit, his weight was up about 6 lbs, which was believed to be caloric rather than fluid retention.  He was unable to take Bidil 1 tab TID, said it made his chest hurt.  He continued to drink some on weekends but less than in the past.  No significant dyspnea walking on flat ground.  Working out some in a gym.  No problems with ADLs.  No orthopnea/PND.  No chest pain. He was using his CPAP nightly.   Today he returns to HF clinic for pharmacist medication titration. At last visit with MD, Bidil 20-37.5 mg 1/2 tablet TID was restarted. Overall he is doing well with that change, no reoccurrence of chest pain. He has occasional dizziness/lightheadness upon standing, but this only occurs 1-2 times per week. No chest pain or palpitations. His breathing is good. He uses an albuterol inhaler a few times per week, usually before exercising. No SOB/DOE. His weight has been stable at home. He does not weigh himself daily, but when he does, his weight typically ranges from 386-390 lbs. He takes furosemide 80 mg daily and has not needed any extra. No LEE, PND or orthopnea. His appetite is "too good". He is currently contemplating bariatric surgery. He follows a low sodium diet but does not fluid restrict. He is using his CPAP nightly.    Marland Kitchen Shortness of breath/dyspnea on exertion? no  . Orthopnea/PND? no . Edema? no . Lightheadedness/dizziness? occasional  dizziness while standing. Only a few times per week.  . Daily weights at home? No - I encouraged him to weigh himself at least 3 times per week, preferably daily.  . Blood pressure/heart rate monitoring at home? no . Following low-sodium/fluid-restricted diet? yes  HF Medications: Bisoprolol 7.5 mg daily Entresto 97/103 mg BID Spironolactone 25 mg daily Bidil 1/2 tablet TID Furosemide 80 mg daily   Has the patient been experiencing any side effects to the medications prescribed?  Previously noted chest pain with Bidil. Has not recurred since restarting at a lower dose.   Does the patient have any problems obtaining medications due to transportation or finances?   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 He did note higher copay (~$50.00/month) for both Entresto and Bidil. I instructed him on how to obtain and activate copay cards to decrease this cost.   Understanding of regimen: good Understanding of indications: good Potential of compliance: good Patient understands to avoid NSAIDs. Patient understands to avoid decongestants.    Pertinent Lab Values (03/10/19): Marland Kitchen Serum creatinine 1.43, BUN 13, Potassium 4.2, Sodium 136  Vital Signs: . Weight: 396.8 lbs (last clinic weight: 390 lbs) . Blood pressure: 133/80  . Heart rate: 92   Assessment: 1. Chronic systolic CHF: Nonischemic cardiomyopathy.  Echo in 6/20 showed EF 15-20% with moderate-severe MR.  RHC/LHC indicated nonischemic cardiomyopathy, cardiac output preserved. Cause of cardiomyopathy is uncertain, he was a heavy drinker in the past.  He has also been  found to have HIV, which can cause a cardiomyopathy.  Echo in 10/20 showed EF improved up to 35-40%.   - Euvolemic on exam, NYHA class II.  - Continue furosemide 80 mg daily - Continue bisoprolol 7.5 mg daily.  - Continue Entresto 97/103 mg BID.  - Continue spironolactone 25 mg daily.  - Increase Bidil 20-37.5 mg to 1 tablet TID. Previously complained of "chest pain" with Bidil, but  has not recurred since restarting therapy.  - He would be a good candidate for SGLT2i in the future. Pharmacy Benefits Investigation: Marcelline Deist (dapagliflozin) copay is $0.  - Repeat echo at followup in 2/21.   - I do not think that he would fit in scanner for cardiac MRI.  2. CAD: LHC (6/20) showed 70% D1 stenosis.   - Continue ASA 81 daily.  - Continue atorvastatin 20 mg daily. LDL 73 on 01/25/19.  3. Mitral regurgitation: This seems much improved on 10/20 echo, trivial.  4. ETOH abuse: He has cut back considerably.  Prior ETOH abuse may have contributed to his cardiomyopathy.  - I strongly encouraged cut back further on ETOH (quit completely for now).  5. OSA: Continue CPAP.  6. Obesity: He is interested in bariatric surgery.  I will refer to [facility]. 7. HIV - Continue Biktarvy  Plan: 1) Medication changes: Based on clinical presentation, vital signs and recent labs will increase Bidil to 1 tablet TID. 2) Follow-up: HF Clinic in 1 month with Dr. Jonn Shingles, PharmD, BCPS, [facility], CPP Heart Failure Clinic Pharmacist [phone]

## 2019-04-14 ENCOUNTER — Other Ambulatory Visit: Payer: Self-pay

## 2019-04-14 ENCOUNTER — Ambulatory Visit (HOSPITAL_COMMUNITY)
Admission: RE | Admit: 2019-04-14 | Discharge: 2019-04-14 | Disposition: A | Discharge status: Home / Self Care | Payer: BC Managed Care – PPO | Source: Ambulatory Visit | Attending: Internal Medicine | Admitting: Internal Medicine

## 2019-04-14 VITALS — BP 133/80 | HR 92 | Wt 396.8 lb

## 2019-04-14 DIAGNOSIS — F1010 Alcohol abuse, uncomplicated: Secondary | ICD-10-CM | POA: Insufficient documentation

## 2019-04-14 DIAGNOSIS — I5022 Chronic systolic (congestive) heart failure: Secondary | ICD-10-CM

## 2019-04-14 DIAGNOSIS — B20 Human immunodeficiency virus [HIV] disease: Secondary | ICD-10-CM | POA: Insufficient documentation

## 2019-04-14 DIAGNOSIS — I2510 Atherosclerotic heart disease of native coronary artery without angina pectoris: Secondary | ICD-10-CM | POA: Insufficient documentation

## 2019-04-14 DIAGNOSIS — J45909 Unspecified asthma, uncomplicated: Secondary | ICD-10-CM | POA: Insufficient documentation

## 2019-04-14 DIAGNOSIS — I340 Nonrheumatic mitral (valve) insufficiency: Secondary | ICD-10-CM | POA: Insufficient documentation

## 2019-04-14 DIAGNOSIS — E669 Obesity, unspecified: Secondary | ICD-10-CM | POA: Diagnosis not present

## 2019-04-14 DIAGNOSIS — I428 Other cardiomyopathies: Secondary | ICD-10-CM | POA: Diagnosis not present

## 2019-04-14 DIAGNOSIS — Z79899 Other long term (current) drug therapy: Secondary | ICD-10-CM | POA: Diagnosis not present

## 2019-04-14 DIAGNOSIS — G4733 Obstructive sleep apnea (adult) (pediatric): Secondary | ICD-10-CM | POA: Insufficient documentation

## 2019-04-14 MED ORDER — ISOSORB DINITRATE-HYDRALAZINE 20-37.5 MG PO TABS: 1.0000 | ORAL_TABLET | Freq: Three times a day (TID) | ORAL | 3 refills | Status: DC

## 2019-04-14 NOTE — Patient Instructions (Signed)
It was a pleasure seeing you today!  MEDICATIONS: -We are changing your medications today -Increase Bidil to 1 tablet three times daily.  -You can go online to activate copay cards for Entresto and Bidil. -Call if you have questions about your medications.  NEXT APPOINTMENT: Return to clinic in 1 month with Dr. Shirlee Latch.  In general, to take care of your heart failure: -Limit your fluid intake to 2 Liters (half-gallon) per day.   -Limit your salt intake to ideally 2-3 grams (2000-3000 mg) per day. -Weigh yourself daily and record, and bring that "weight diary" to your next appointment.  (Weight gain of 2-3 pounds in 1 day typically means fluid weight.) -The medications for your heart are to help your heart and help you live longer.   -Please contact us before stopping any of your heart medications.  Call the clinic at [phone] with questions or to reschedule future appointments.

## 2019-04-15 ENCOUNTER — Telehealth (HOSPITAL_COMMUNITY): Payer: Self-pay

## 2019-04-15 NOTE — Telephone Encounter (Signed)
Received message from pharmD Lauren that patient inquired about bariatric referral. He was referred back in December however have not received a call.  LM on vm on bariatric navigators phone awaiting call back.

## 2019-04-15 NOTE — Telephone Encounter (Signed)
Bariatric navigator called back, pt was not referred to that office. Pt was referred to facility in [place]. CMA jasmine to contact patient and get information as patient provided at his last visit of where he wanted to go.

## 2019-05-11 ENCOUNTER — Encounter (HOSPITAL_COMMUNITY): Payer: Self-pay | Admitting: Cardiology

## 2019-05-11 ENCOUNTER — Ambulatory Visit (HOSPITAL_BASED_OUTPATIENT_CLINIC_OR_DEPARTMENT_OTHER)
Admission: RE | Admit: 2019-05-11 | Discharge: 2019-05-11 | Disposition: A | Discharge status: Home / Self Care | Payer: BC Managed Care – PPO | Source: Ambulatory Visit | Attending: Cardiology | Admitting: Cardiology

## 2019-05-11 ENCOUNTER — Ambulatory Visit (HOSPITAL_COMMUNITY)
Admission: RE | Admit: 2019-05-11 | Discharge: 2019-05-11 | Disposition: A | Discharge status: Home / Self Care | Payer: BC Managed Care – PPO | Source: Ambulatory Visit | Attending: Cardiology | Admitting: Cardiology

## 2019-05-11 ENCOUNTER — Other Ambulatory Visit: Payer: Self-pay

## 2019-05-11 VITALS — BP 133/83 | HR 86 | Wt 399.0 lb

## 2019-05-11 DIAGNOSIS — Z79899 Other long term (current) drug therapy: Secondary | ICD-10-CM | POA: Insufficient documentation

## 2019-05-11 DIAGNOSIS — I420 Dilated cardiomyopathy: Secondary | ICD-10-CM

## 2019-05-11 DIAGNOSIS — I5022 Chronic systolic (congestive) heart failure: Secondary | ICD-10-CM

## 2019-05-11 DIAGNOSIS — Z6841 Body Mass Index (BMI) 40.0 and over, adult: Secondary | ICD-10-CM | POA: Insufficient documentation

## 2019-05-11 DIAGNOSIS — J45909 Unspecified asthma, uncomplicated: Secondary | ICD-10-CM | POA: Diagnosis not present

## 2019-05-11 DIAGNOSIS — I2510 Atherosclerotic heart disease of native coronary artery without angina pectoris: Secondary | ICD-10-CM | POA: Diagnosis not present

## 2019-05-11 DIAGNOSIS — I110 Hypertensive heart disease with heart failure: Secondary | ICD-10-CM | POA: Diagnosis not present

## 2019-05-11 DIAGNOSIS — I340 Nonrheumatic mitral (valve) insufficiency: Secondary | ICD-10-CM | POA: Diagnosis not present

## 2019-05-11 DIAGNOSIS — F1010 Alcohol abuse, uncomplicated: Secondary | ICD-10-CM | POA: Diagnosis not present

## 2019-05-11 DIAGNOSIS — G4733 Obstructive sleep apnea (adult) (pediatric): Secondary | ICD-10-CM | POA: Insufficient documentation

## 2019-05-11 DIAGNOSIS — I428 Other cardiomyopathies: Secondary | ICD-10-CM | POA: Insufficient documentation

## 2019-05-11 DIAGNOSIS — Z7982 Long term (current) use of aspirin: Secondary | ICD-10-CM | POA: Diagnosis not present

## 2019-05-11 DIAGNOSIS — Z8249 Family history of ischemic heart disease and other diseases of the circulatory system: Secondary | ICD-10-CM | POA: Insufficient documentation

## 2019-05-11 LAB — BASIC METABOLIC PANEL
Anion gap: 8 (ref 5–15)
BUN: 13 mg/dL (ref 6–20)
CO2: 24 mmol/L (ref 22–32)
Calcium: 9.2 mg/dL (ref 8.9–10.3)
Chloride: 105 mmol/L (ref 98–111)
Creatinine, Ser: 1.33 mg/dL — ABNORMAL HIGH (ref 0.61–1.24)
GFR calc Af Amer: >60 mL/min (ref >60)
GFR calc non Af Amer: >60 mL/min (ref >60)
Glucose, Bld: 85 mg/dL (ref 70–99)
Potassium: 4.2 mmol/L (ref 3.5–5.1)
Sodium: 137 mmol/L (ref 135–145)

## 2019-05-11 MED ORDER — BISOPROLOL FUMARATE 10 MG PO TABS: 10.0000 mg | ORAL_TABLET | Freq: Every day | ORAL | 5 refills | Status: DC

## 2019-05-11 MED ORDER — ALBUTEROL SULFATE HFA 108 (90 BASE) MCG/ACT IN AERS: 1.0000 | INHALATION_SPRAY | Freq: Four times a day (QID) | RESPIRATORY_TRACT | 2 refills | Status: DC | PRN

## 2019-05-11 MED ORDER — ISOSORB DINITRATE-HYDRALAZINE 20-37.5 MG PO TABS: 1.5000 | ORAL_TABLET | Freq: Three times a day (TID) | ORAL | 5 refills | Status: DC

## 2019-05-11 NOTE — Progress Notes (Signed)
  Echocardiogram 2D Echocardiogram has been performed.  Todd Griffith 05/11/2019, 11:51 AM

## 2019-05-11 NOTE — Patient Instructions (Signed)
INCREASE Bisoprolol to 10mg  1 tab daily   INCREASE BIDIL to 1.5 tabs three times a day   For [facility] for bariatric surgery evaluation, please call [phone] to arrange a visit with them.   You have been referred to [facility].  They will call you to schedule this appointment.    Your physician recommends that you schedule a follow-up appointment in: 3 months with Dr [phone]   Please call office at [phone] option [phone] if you have any questions or concerns.     At the [facility], you and your health needs are our priority. As part of our continuing mission to provide you with exceptional heart care, we have created designated Provider Care Teams. These Care Teams include your primary Cardiologist (physician) and Advanced Practice Providers (APPs- Physician Assistants and Nurse Practitioners) who all work together to provide you with the care you need, when you need it.   You may see any of the following providers on your designated Care Team at your next follow up: [phone] Dr Marland Kitchen . Dr Arvilla Meres . Marca Ancona, NP . Tonye Becket, PA . Robbie Lis, PharmD   Please be sure to bring in all your medications bottles to every appointment.

## 2019-05-12 NOTE — Progress Notes (Signed)
PCP: Massie Maroon, FNP Cardiology: Dr. Shirlee Latch  29 y.o. with history of asthma, OSA, HIV+, and nonischemic cardiomyopathy returns for followup of CHF.  He was admitted in 6/20 with dyspnea and found to have CHF.  Echo showed EF 15-20% with diffuse hypokinesis, moderate-severe MR. LHC/RHC showed mild coronary disease, preserved cardiac output.  He was also diagnosed with HIV and started on Biktarvy.   Echo in 10/20 showed EF up to 35-40%, MR is only trivial.  Echo was done today and reviewed, EF 35-40% with normal RV.   Weight is up today about 9 lbs.  He thinks that this is due to dietary indiscretion.  No significant exertional dyspnea.  He is able to grocery shop and walk wherever he wants without significant dyspnea.  No chest pain.  He goes to the gym twice a week, uses treadmill.  He is staying away from ETOH.  He is using his CPAP nightly.    Labs (9/20): K 4.2, creatinine 1.48 Labs (10/20): LDL 73, HDL 26 Labs (11/20): K 4.1, creatinine 1.39 Labs (12/20): K 4.2, creatinine 1.43  ECG (personally reviewed): NSR, LVH  PMH: 1. Chronic systolic CHF: Nonischemic cardiomyopathy.   - LHC/RHC (6/20): mean RA 4, mean PCWP 21, CI 3; 70% D1 stenosis.  - Echo (6/20): EF 15-20%, normal RV, moderate-severe MR.  - Echo (10/20): EF 35-40% with diffuse hypokinesis, normal RV, trivial MR.   - Echo (2/21): EF 35-40%, diffuse hypokinesis, normal RV, normal IVC 2. HIV+: Followed by ID.  3. H/o ETOH abuse.  4. Asthma: Since childhood.  5. HTN 6. Morbid obesity.  7. OSA: Uses CPAP.  8. CAD: LHC in 6/20 with 70% D1 stenosis.   FH: Father, grandf ather with CAD, no cardiomyopathy.   SH: Lives in [place] with girlfriend and child, works for [employer] [place].  Nonsmoker.  Prior heavy ETOH, now drinks some on weekend but much less. Nonsmoker, no drugs.   ROS: All systems reviewed and negative except as per HPI.   Current Outpatient Medications  Medication Sig Dispense Refill  . albuterol  (VENTOLIN HFA) 108 (90 Base) MCG/ACT inhaler Inhale 1-2 puffs into the lungs every 6 (six) hours as needed for wheezing or shortness of breath. 6.7 g 2  . aspirin 81 MG chewable tablet Chew 1 tablet (81 mg total) by mouth daily. 30 tablet 3  . atorvastatin (LIPITOR) 20 MG tablet Take 1 tablet (20 mg total) by mouth daily. 90 tablet 3  . bictegravir-emtricitabine-tenofovir AF (BIKTARVY) 50-200-25 MG TABS tablet Take 1 tablet by mouth daily. 30 tablet 5  . bisoprolol (ZEBETA) 10 MG tablet Take 1 tablet (10 mg total) by mouth daily. 30 tablet 5  . furosemide (LASIX) 80 MG tablet Take 1 tablet (80 mg total) by mouth daily. Take one TAB every morning. 30 tablet 3  . isosorbide-hydrALAZINE (BIDIL) 20-37.5 MG tablet Take 1.5 tablets by mouth 3 (three) times daily. 135 tablet 5  . sacubitril-valsartan (ENTRESTO) 97-103 MG Take 2 tablets by mouth 2 (two) times daily.    Marland Kitchen spironolactone (ALDACTONE) 25 MG tablet TAKE 1 TABLET BY MOUTH EVERY DAY 90 tablet 1   No current facility-administered medications for this encounter.   BP 133/83   Pulse 86   Wt (!) 181 kg (399 lb)   SpO2 98%   BMI 57.25 kg/m  General: NAD, obese.  Neck: No JVD, no thyromegaly or thyroid nodule.  Lungs: Clear to auscultation bilaterally with normal respiratory effort. CV: Nondisplaced PMI.  Heart regular S1/S2, no S3/S4, no murmur.  No peripheral edema.  No carotid bruit.  Normal pedal pulses.  Abdomen: Soft, nontender, no hepatosplenomegaly, no distention.  Skin: Intact without lesions or rashes.  Neurologic: Alert and oriented x 3.  Psych: Normal affect. Extremities: No clubbing or cyanosis.  HEENT: Normal.   Assessment/Plan: 1. Chronic systolic CHF: Nonischemic cardiomyopathy.  Echo in 6/20 showed EF 15-20% with moderate-severe MR.  RHC/LHC indicated nonischemic cardiomyopathy, cardiac output preserved. Cause of cardiomyopathy is uncertain, he was a heavy drinker in the past.  He has also been found to have HIV, which can  cause a cardiomyopathy.  Echo in 10/20 showed EF improved up to 35-40%.  Echo was done today and reviewed, EF 35-40% still.  He is not volume overloaded on exam, NYHA class II.  - Increase bisoprolol to 10 mg daily.  - Continue Entresto 97/103 bid.  - Continue spironolactone 25 mg daily.  - Increase Bidil to 1.5 tabs tid.   - He can continue current Lasix, 80 mg daily.  BMET today.  - I think that he is just out of the range for ICD.    - I do not think that he would fit in scanner for cardiac MRI.  2. CAD: LHC (6/20) showed 70% D1 stenosis.   - Continue ASA 81 daily.  - Continue atorvastatin 20 mg daily. Good lipids in 10/20.  3. Mitral regurgitation: This seems much improved on 10/20 echo, trivial.  4. ETOH abuse: He has cut back considerably.  Prior ETOH abuse may have contributed to his cardiomyopathy.  5. OSA: Continue CPAP.  6. Obesity: He is interested in bariatric surgery.  - I will refer to [facility] to start involving him in the bariatric program.  - I will also refer him to the [facility].   Followup in 3 months.   Loralie Champagne 05/12/2019

## 2019-06-04 ENCOUNTER — Other Ambulatory Visit: Payer: Self-pay | Admitting: Family

## 2019-06-04 DIAGNOSIS — B20 Human immunodeficiency virus [HIV] disease: Secondary | ICD-10-CM

## 2019-07-23 ENCOUNTER — Other Ambulatory Visit (HOSPITAL_COMMUNITY): Payer: Self-pay | Admitting: Adult Health

## 2019-08-02 DIAGNOSIS — Z03818 Encounter for observation for suspected exposure to other biological agents ruled out: Secondary | ICD-10-CM | POA: Diagnosis not present

## 2019-08-02 DIAGNOSIS — Z20828 Contact with and (suspected) exposure to other viral communicable diseases: Secondary | ICD-10-CM | POA: Diagnosis not present

## 2019-08-03 ENCOUNTER — Ambulatory Visit (INDEPENDENT_AMBULATORY_CARE_PROVIDER_SITE_OTHER): Payer: BC Managed Care – PPO | Admitting: Family Medicine

## 2019-08-17 ENCOUNTER — Ambulatory Visit (INDEPENDENT_AMBULATORY_CARE_PROVIDER_SITE_OTHER): Payer: BC Managed Care – PPO | Admitting: Family Medicine

## 2019-08-18 ENCOUNTER — Ambulatory Visit (HOSPITAL_COMMUNITY)
Admission: RE | Admit: 2019-08-18 | Discharge: 2019-08-18 | Disposition: A | Discharge status: Home / Self Care | Payer: BC Managed Care – PPO | Source: Ambulatory Visit | Attending: Cardiology | Admitting: Cardiology

## 2019-08-18 ENCOUNTER — Other Ambulatory Visit: Payer: Self-pay

## 2019-08-18 VITALS — BP 122/74 | HR 84 | Wt >= 6400 oz

## 2019-08-18 DIAGNOSIS — I110 Hypertensive heart disease with heart failure: Secondary | ICD-10-CM | POA: Diagnosis not present

## 2019-08-18 DIAGNOSIS — Z9989 Dependence on other enabling machines and devices: Secondary | ICD-10-CM | POA: Diagnosis not present

## 2019-08-18 DIAGNOSIS — F1010 Alcohol abuse, uncomplicated: Secondary | ICD-10-CM | POA: Diagnosis not present

## 2019-08-18 DIAGNOSIS — I2510 Atherosclerotic heart disease of native coronary artery without angina pectoris: Secondary | ICD-10-CM | POA: Insufficient documentation

## 2019-08-18 DIAGNOSIS — Z21 Asymptomatic human immunodeficiency virus [HIV] infection status: Secondary | ICD-10-CM | POA: Diagnosis not present

## 2019-08-18 DIAGNOSIS — J45909 Unspecified asthma, uncomplicated: Secondary | ICD-10-CM | POA: Diagnosis not present

## 2019-08-18 DIAGNOSIS — I428 Other cardiomyopathies: Secondary | ICD-10-CM | POA: Diagnosis not present

## 2019-08-18 DIAGNOSIS — I340 Nonrheumatic mitral (valve) insufficiency: Secondary | ICD-10-CM | POA: Insufficient documentation

## 2019-08-18 DIAGNOSIS — Z7982 Long term (current) use of aspirin: Secondary | ICD-10-CM | POA: Diagnosis not present

## 2019-08-18 DIAGNOSIS — Z79899 Other long term (current) drug therapy: Secondary | ICD-10-CM | POA: Diagnosis not present

## 2019-08-18 DIAGNOSIS — G4733 Obstructive sleep apnea (adult) (pediatric): Secondary | ICD-10-CM | POA: Diagnosis not present

## 2019-08-18 DIAGNOSIS — Z8249 Family history of ischemic heart disease and other diseases of the circulatory system: Secondary | ICD-10-CM | POA: Diagnosis not present

## 2019-08-18 DIAGNOSIS — I5042 Chronic combined systolic (congestive) and diastolic (congestive) heart failure: Secondary | ICD-10-CM | POA: Diagnosis not present

## 2019-08-18 DIAGNOSIS — I5022 Chronic systolic (congestive) heart failure: Secondary | ICD-10-CM | POA: Diagnosis not present

## 2019-08-18 LAB — BASIC METABOLIC PANEL
Anion gap: 10 (ref 5–15)
BUN: 17 mg/dL (ref 6–20)
CO2: 23 mmol/L (ref 22–32)
Calcium: 9 mg/dL (ref 8.9–10.3)
Chloride: 105 mmol/L (ref 98–111)
Creatinine, Ser: 1.36 mg/dL — ABNORMAL HIGH (ref 0.61–1.24)
GFR calc Af Amer: >60 mL/min (ref >60)
GFR calc non Af Amer: >60 mL/min (ref >60)
Glucose, Bld: 168 mg/dL — ABNORMAL HIGH (ref 70–99)
Potassium: 4.2 mmol/L (ref 3.5–5.1)
Sodium: 138 mmol/L (ref 135–145)

## 2019-08-18 MED ORDER — ISOSORB DINITRATE-HYDRALAZINE 20-37.5 MG PO TABS: 2.0000 | ORAL_TABLET | Freq: Three times a day (TID) | ORAL | 5 refills | Status: DC

## 2019-08-18 NOTE — Patient Instructions (Signed)
Labs done today. We will contact you only if your labs are abnormal.  INCREASE Bidil Take 2 tablets by mouth 3 times daily.  No other medication changes were made. Please continue all current medications as prescribed.  Your physician recommends that you schedule a follow-up appointment in: 3 months with our [facility](here in office)  At the [facility], you and your health needs are our priority. As part of our continuing mission to provide you with exceptional heart care, we have created designated Provider Care Teams. These Care Teams include your primary Cardiologist (physician) and Advanced Practice Providers (APPs- Physician Assistants and Nurse Practitioners) who all work together to provide you with the care you need, when you need it.   You may see any of the following providers on your designated Care Team at your next follow up: Marland Kitchen Dr Arvilla Meres . Dr Marca Ancona . Tonye Becket, NP . Robbie Lis, PA . Karle Plumber, PharmD   Please be sure to bring in all your medications bottles to every appointment.

## 2019-08-18 NOTE — Progress Notes (Signed)
PCP: Massie Maroon, FNP Cardiology: Dr. Shirlee Latch  29 y.o. with history of asthma, OSA, HIV+, and nonischemic cardiomyopathy returns for followup of CHF.  He was admitted in 6/20 with dyspnea and found to have CHF.  Echo showed EF 15-20% with diffuse hypokinesis, moderate-severe MR. LHC/RHC showed mild coronary disease, preserved cardiac output.  He was also diagnosed with HIV and started on Biktarvy.   Echo in 10/20 showed EF up to 35-40%, MR is only trivial.  Echo in 2/21 showed EF 35-40% with normal RV.   Weight is up 4 lbs.  No exercise in the last month as he has been grieving for 2 family members who died in the last few weeks.  Using CPAP.  Drinking about 1 pint liquor/week.  No significant exertional dyspnea.  No orthopnea/PND.  No chest pain.   Labs (9/20): K 4.2, creatinine 1.48 Labs (10/20): LDL 73, HDL 26 Labs (11/20): K 4.1, creatinine 1.39 Labs (12/20): K 4.2, creatinine 1.43  PMH: 1. Chronic systolic CHF: Nonischemic cardiomyopathy.   - LHC/RHC (6/20): mean RA 4, mean PCWP 21, CI 3; 70% D1 stenosis.  - Echo (6/20): EF 15-20%, normal RV, moderate-severe MR.  - Echo (10/20): EF 35-40% with diffuse hypokinesis, normal RV, trivial MR.   - Echo (2/21): EF 35-40%, diffuse hypokinesis, normal RV, normal IVC 2. HIV+: Followed by ID.  3. H/o ETOH abuse.  4. Asthma: Since childhood.  5. HTN 6. Morbid obesity.  7. OSA: Uses CPAP.  8. CAD: LHC in 6/20 with 70% D1 stenosis.   FH: Father, grandf ather with CAD, no cardiomyopathy.   SH: Lives in [place] with girlfriend and child, works for [employer] [place].  Nonsmoker.  Prior heavy ETOH, now drinks some on weekend but much less. Nonsmoker, no drugs.   ROS: All systems reviewed and negative except as per HPI.   Current Outpatient Medications  Medication Sig Dispense Refill  . albuterol (VENTOLIN HFA) 108 (90 Base) MCG/ACT inhaler Inhale 1-2 puffs into the lungs every 6 (six) hours as needed for wheezing or shortness of breath.  6.7 g 2  . aspirin 81 MG chewable tablet Chew 1 tablet (81 mg total) by mouth daily. 30 tablet 3  . atorvastatin (LIPITOR) 20 MG tablet Take 1 tablet (20 mg total) by mouth daily. 90 tablet 3  . BIKTARVY 50-200-25 MG TABS tablet TAKE 1 TABLET BY MOUTH EVERY DAY 30 tablet 4  . bisoprolol (ZEBETA) 10 MG tablet Take 1 tablet (10 mg total) by mouth daily. 30 tablet 5  . furosemide (LASIX) 80 MG tablet Take 1 tablet (80 mg total) by mouth daily. Take one TAB every morning. 30 tablet 3  . isosorbide-hydrALAZINE (BIDIL) 20-37.5 MG tablet Take 2 tablets by mouth 3 (three) times daily. 180 tablet 5  . sacubitril-valsartan (ENTRESTO) 97-103 MG Take 2 tablets by mouth 2 (two) times daily.    Marland Kitchen spironolactone (ALDACTONE) 25 MG tablet TAKE 1 TABLET BY MOUTH EVERY DAY 90 tablet 1   No current facility-administered medications for this encounter.   BP 122/74   Pulse 84   Wt (!) 182.8 kg (403 lb)   SpO2 98%   BMI 57.82 kg/m  General: NAD, obese Neck: No JVD, no thyromegaly or thyroid nodule.  Lungs: Clear to auscultation bilaterally with normal respiratory effort. CV: Nondisplaced PMI.  Heart regular S1/S2, no S3/S4, no murmur.  No peripheral edema.  No carotid bruit.  Normal pedal pulses.  Abdomen: Soft, nontender, no hepatosplenomegaly, no distention.  Skin: Intact without lesions or rashes.  Neurologic: Alert and oriented x 3.  Psych: Normal affect. Extremities: No clubbing or cyanosis.  HEENT: Normal.   Assessment/Plan: 1. Chronic systolic CHF: Nonischemic cardiomyopathy.  Echo in 6/20 showed EF 15-20% with moderate-severe MR.  RHC/LHC indicated nonischemic cardiomyopathy, cardiac output preserved. Cause of cardiomyopathy is uncertain, he was a heavy drinker in the past.  He has also been found to have HIV, which can cause a cardiomyopathy.  Echo in 10/20 showed EF improved up to 35-40%.  Echo in 2/21 showed EF 35-40% still.  He is not volume overloaded on exam, NYHA class II.  - Continue  bisoprolol 10 mg daily.  - Continue Entresto 97/103 bid.  - Continue spironolactone 25 mg daily.  - Increase Bidil to 2 tabs tid.   - He can continue current Lasix, 80 mg daily.  BMET today.  - I think that he is just out of the range for ICD.    - I do not think that he would fit in scanner for cardiac MRI.  2. CAD: LHC (6/20) showed 70% D1 stenosis.   - Continue ASA 81 daily.  - Continue atorvastatin 20 mg daily. Good lipids in 10/20.  3. Mitral regurgitation: Much improved on most recent echoes.  4. ETOH abuse: Prior ETOH abuse may have contributed to his cardiomyopathy. Still drinking about 1 pint liquor/week.  I urged him to quit totally.  5. OSA: Continue CPAP.  6. Obesity: He is interested in bariatric surgery.  - He is going to go to the bariatric seminar at [place].  - I will also refer him to the [facility].   Followup in 3 months with NP/PA.   Loralie Champagne 08/18/2019

## 2019-09-16 ENCOUNTER — Ambulatory Visit (INDEPENDENT_AMBULATORY_CARE_PROVIDER_SITE_OTHER): Payer: BC Managed Care – PPO

## 2019-09-16 ENCOUNTER — Encounter: Payer: Self-pay | Admitting: Emergency Medicine

## 2019-09-16 ENCOUNTER — Ambulatory Visit: Payer: Self-pay

## 2019-09-16 ENCOUNTER — Other Ambulatory Visit: Payer: Self-pay

## 2019-09-16 ENCOUNTER — Ambulatory Visit
Admission: EM | Admit: 2019-09-16 | Discharge: 2019-09-16 | Disposition: A | Discharge status: Home / Self Care | Payer: BC Managed Care – PPO | Attending: Family | Admitting: Family

## 2019-09-16 DIAGNOSIS — J189 Pneumonia, unspecified organism: Secondary | ICD-10-CM | POA: Diagnosis not present

## 2019-09-16 DIAGNOSIS — Z79899 Other long term (current) drug therapy: Secondary | ICD-10-CM | POA: Insufficient documentation

## 2019-09-16 DIAGNOSIS — Z7982 Long term (current) use of aspirin: Secondary | ICD-10-CM | POA: Diagnosis not present

## 2019-09-16 DIAGNOSIS — R071 Chest pain on breathing: Secondary | ICD-10-CM

## 2019-09-16 DIAGNOSIS — I5041 Acute combined systolic (congestive) and diastolic (congestive) heart failure: Secondary | ICD-10-CM | POA: Insufficient documentation

## 2019-09-16 DIAGNOSIS — R05 Cough: Secondary | ICD-10-CM | POA: Diagnosis not present

## 2019-09-16 DIAGNOSIS — J45909 Unspecified asthma, uncomplicated: Secondary | ICD-10-CM | POA: Insufficient documentation

## 2019-09-16 DIAGNOSIS — Z87891 Personal history of nicotine dependence: Secondary | ICD-10-CM | POA: Diagnosis not present

## 2019-09-16 DIAGNOSIS — Z8249 Family history of ischemic heart disease and other diseases of the circulatory system: Secondary | ICD-10-CM | POA: Diagnosis not present

## 2019-09-16 DIAGNOSIS — H938X3 Other specified disorders of ear, bilateral: Secondary | ICD-10-CM | POA: Insufficient documentation

## 2019-09-16 DIAGNOSIS — R079 Chest pain, unspecified: Secondary | ICD-10-CM | POA: Diagnosis not present

## 2019-09-16 DIAGNOSIS — G4733 Obstructive sleep apnea (adult) (pediatric): Secondary | ICD-10-CM | POA: Diagnosis not present

## 2019-09-16 DIAGNOSIS — Z8679 Personal history of other diseases of the circulatory system: Secondary | ICD-10-CM | POA: Diagnosis not present

## 2019-09-16 DIAGNOSIS — I420 Dilated cardiomyopathy: Secondary | ICD-10-CM | POA: Insufficient documentation

## 2019-09-16 DIAGNOSIS — Z21 Asymptomatic human immunodeficiency virus [HIV] infection status: Secondary | ICD-10-CM | POA: Insufficient documentation

## 2019-09-16 DIAGNOSIS — Z6841 Body Mass Index (BMI) 40.0 and over, adult: Secondary | ICD-10-CM | POA: Diagnosis not present

## 2019-09-16 DIAGNOSIS — Z20828 Contact with and (suspected) exposure to other viral communicable diseases: Secondary | ICD-10-CM

## 2019-09-16 DIAGNOSIS — R059 Cough, unspecified: Secondary | ICD-10-CM

## 2019-09-16 DIAGNOSIS — U071 COVID-19: Secondary | ICD-10-CM | POA: Diagnosis not present

## 2019-09-16 DIAGNOSIS — R0600 Dyspnea, unspecified: Secondary | ICD-10-CM | POA: Diagnosis not present

## 2019-09-16 HISTORY — DX: Obstructive sleep apnea (adult) (pediatric): G47.33

## 2019-09-16 HISTORY — DX: Heart failure, unspecified: I50.9

## 2019-09-16 MED ORDER — FLUTICASONE PROPIONATE 50 MCG/ACT NA SUSP: 2.0000 | Freq: Every day | NASAL | 0 refills | Status: AC

## 2019-09-16 MED ORDER — DOXYCYCLINE HYCLATE 100 MG PO CAPS: 100.0000 mg | ORAL_CAPSULE | Freq: Two times a day (BID) | ORAL | 0 refills | Status: AC

## 2019-09-16 MED ORDER — AMOXICILLIN-POT CLAVULANATE ER 1000-62.5 MG PO TB12: 2.0000 | ORAL_TABLET | Freq: Two times a day (BID) | ORAL | 0 refills | Status: AC

## 2019-09-16 NOTE — ED Triage Notes (Signed)
Pt c/o cough, pain in chest when breathing in, yellow sputum with blood, started about 2 days ago. Denies fever. He states he has asthma.

## 2019-09-16 NOTE — ED Provider Notes (Signed)
[facility]    CSN: [id number] Arrival date & time: 09/16/19  1238      History   Chief Complaint Chief Complaint  Patient presents with  . Cough    HPI Todd Griffith is a 29 y.o. male.   29 year old male presents with cough, nasal congestion, chest pain when breathing for the past 2 to 3 days. He has been coughing up discolored mucus with occasional blood. Ears feel clogged. Denies any fever, headache or GI symptoms. Has taken OTC cough drops and cough medications with minimal relief. No known exposure to COVID 19. Has not had the vaccine yet. Former smoker. Significant history for asthma in which he has an Albuterol inhaler and CHF, Obstructive sleep apnea, obesity and HIV. He is currently on Entresto, bisoprolol, Isosorbide-Hydralazine, Lasix 80mg , Aldactone, Lipitor, aspirin, and Biktarvy daily. He has routine visits with his Cardiologist at [facility] in [place]- last visit May 2021. Records showed no significant change in therapy. He is also followed by Infectious Disease.   The history is provided by the patient.    Past Medical History:  Diagnosis Date  . Asthma   . Bronchitis   . CHF (congestive heart failure) (HCC)   . HIV (human immunodeficiency virus infection) (HCC)   . Obstructive sleep apnea     Patient Active Problem List   Diagnosis Date Noted  . Healthcare maintenance 11/09/2018  . HIV disease (HCC) 09/22/2018  . Dilated cardiomyopathy (HCC) 09/21/2018  . Acute combined systolic and diastolic heart failure (HCC)   . Morbid obesity (HCC)   . Volume overload 09/17/2018  . Peripheral edema   . Orthopnea   . Cardiomegaly   . Uncomplicated asthma     Past Surgical History:  Procedure Laterality Date  . RIGHT/LEFT HEART CATH AND CORONARY ANGIOGRAPHY N/A 09/21/2018   Procedure: RIGHT/LEFT HEART CATH AND CORONARY ANGIOGRAPHY;  Surgeon: 09/23/2018, MD;  Location: [facility] INVASIVE CV LAB;  Service: Cardiovascular;  Laterality: N/A;         Home Medications    Prior to Admission medications   Medication Sig Start Date End Date Taking? Authorizing Provider  albuterol (VENTOLIN HFA) 108 (90 Base) MCG/ACT inhaler Inhale 1-2 puffs into the lungs every 6 (six) hours as needed for wheezing or shortness of breath. 05/11/19  Yes 07/09/19, MD  aspirin 81 MG chewable tablet Chew 1 tablet (81 mg total) by mouth daily. 10/23/18  Yes Clegg, Amy D, NP  atorvastatin (LIPITOR) 20 MG tablet Take 1 tablet (20 mg total) by mouth daily. 10/23/18  Yes Clegg, Amy D, NP  BIKTARVY 50-200-25 MG TABS tablet TAKE 1 TABLET BY MOUTH EVERY DAY 06/04/19  Yes 08/04/19, FNP  bisoprolol (ZEBETA) 10 MG tablet Take 1 tablet (10 mg total) by mouth daily. 05/11/19  Yes 07/09/19, MD  furosemide (LASIX) 80 MG tablet Take 1 tablet (80 mg total) by mouth daily. Take one TAB every morning. 10/23/18  Yes Clegg, Amy D, NP  isosorbide-hydrALAZINE (BIDIL) 20-37.5 MG tablet Take 2 tablets by mouth 3 (three) times daily. 08/18/19  Yes 08/20/19, MD  sacubitril-valsartan (ENTRESTO) 97-103 MG Take 2 tablets by mouth 2 (two) times daily.   Yes [provider]  spironolactone (ALDACTONE) 25 MG tablet TAKE 1 TABLET BY MOUTH EVERY DAY 07/23/19  Yes 07/25/19, MD  amoxicillin-clavulanate (AUGMENTIN XR) 1000-62.5 MG 12 hr tablet Take 2 tablets by mouth 2 (two) times daily for 7 days.  09/16/19 09/23/19  Sudie Grumbling, NP  doxycycline (VIBRAMYCIN) 100 MG capsule Take 1 capsule (100 mg total) by mouth 2 (two) times daily for 7 days. 09/16/19 09/23/19  Sudie Grumbling, NP  fluticasone (FLONASE) 50 MCG/ACT nasal spray Place 2 sprays into both nostrils daily. 09/16/19   Sudie Grumbling, NP    Family History Family History  Problem Relation Age of Onset  . Hypertension Mother   . Diabetes Mellitus II Father   . CAD Father        Died at 75 of 'CAD' while sleeping   . Obesity Sister   . Colon cancer Neg Hx   . Esophageal cancer Neg Hx      Social History Social History   Tobacco Use  . Smoking status: Former Smoker    Types: Cigarettes    Quit date: 05/31/2018    Years since quitting: 1.2  . Smokeless tobacco: Never Used  Vaping Use  . Vaping Use: Former  Substance Use Topics  . Alcohol use: Yes    Alcohol/week: 3.0 standard drinks    Types: 3 Cans of beer per week    Comment: socially   . Drug use: Not Currently     Allergies   Patient has no known allergies.   Review of Systems Review of Systems  Constitutional: Positive for activity change and fatigue. Negative for appetite change, chills, diaphoresis and fever.  HENT: Positive for congestion, rhinorrhea and sinus pressure. Negative for ear discharge, ear pain, facial swelling, mouth sores, nosebleeds, postnasal drip, sinus pain, sneezing, sore throat and trouble swallowing.   Eyes: Negative for pain, discharge, redness and itching.  Respiratory: Positive for cough, chest tightness, shortness of breath and wheezing.   Cardiovascular: Positive for chest pain. Negative for palpitations.  Gastrointestinal: Negative for abdominal pain, nausea and vomiting.  Musculoskeletal: Negative for arthralgias, myalgias, neck pain and neck stiffness.  Skin: Negative for color change, rash and wound.  Allergic/Immunologic: Positive for immunocompromised state. Negative for environmental allergies and food allergies.  Neurological: Negative for dizziness, seizures, syncope, weakness, light-headedness and headaches.  Hematological: Negative for adenopathy. Bruises/bleeds easily.  Psychiatric/Behavioral: Positive for sleep disturbance.     Physical Exam Triage Vital Signs ED Triage Vitals  Enc Vitals Group     BP 09/16/19 1304 134/84     Pulse Rate 09/16/19 1304 90     Resp 09/16/19 1304 20     Temp 09/16/19 1304 99.1 F (37.3 C)     Temp Source 09/16/19 1304 Oral     SpO2 09/16/19 1304 98 %     Weight 09/16/19 1301 (!) 403 lb (182.8 kg)     Height 09/16/19 1301  5\' 10"  (1.778 m)     Head Circumference --      Peak Flow --      Pain Score 09/16/19 1301 0     Pain Loc --      Pain Edu? --      Excl. in GC? --    No data found.  Updated Vital Signs BP 134/84 (BP Location: Right Arm)   Pulse 90   Temp 99.1 F (37.3 C) (Oral)   Resp 20   Ht 5\' 10"  (1.778 m)   Wt (!) 403 lb (182.8 kg)   SpO2 98%   BMI 57.82 kg/m   Visual Acuity Right Eye Distance:   Left Eye Distance:   Bilateral Distance:    Right Eye Near:   Left Eye Near:  Bilateral Near:     Physical Exam Vitals and nursing note reviewed.  Constitutional:      General: He is awake. He is not in acute distress.    Appearance: He is well-developed and well-groomed. He is morbidly obese. He is not ill-appearing.     Comments: He is sitting comfortably in the exam chair in no acute distress and talking in complete sentences.   HENT:     Head: Normocephalic and atraumatic.     Right Ear: Hearing, ear canal and external ear normal. No drainage, swelling or tenderness. A middle ear effusion is present. Tympanic membrane is bulging. Tympanic membrane is not injected or erythematous.     Left Ear: Hearing, ear canal and external ear normal. No drainage, swelling or tenderness. A middle ear effusion is present. Tympanic membrane is bulging. Tympanic membrane is not injected or erythematous.     Nose: Congestion present.     Right Turbinates: Swollen.     Left Turbinates: Swollen.     Right Sinus: No maxillary sinus tenderness or frontal sinus tenderness.     Left Sinus: No maxillary sinus tenderness or frontal sinus tenderness.     Mouth/Throat:     Lips: Pink.     Mouth: Mucous membranes are moist.     Pharynx: Oropharynx is clear. Uvula midline. No pharyngeal swelling, oropharyngeal exudate, posterior oropharyngeal erythema or uvula swelling.  Eyes:     Extraocular Movements: Extraocular movements intact.     Conjunctiva/sclera: Conjunctivae normal.  Cardiovascular:     Rate and  Rhythm: Normal rate and regular rhythm.     Heart sounds: Normal heart sounds. No murmur heard.   Pulmonary:     Effort: Pulmonary effort is normal. No accessory muscle usage, respiratory distress or retractions.     Breath sounds: Decreased air movement present. Examination of the right-upper field reveals decreased breath sounds and wheezing. Examination of the left-upper field reveals decreased breath sounds and wheezing. Decreased breath sounds and wheezing present. No rhonchi or rales.  Musculoskeletal:        General: Normal range of motion.     Cervical back: Normal range of motion and neck supple. No rigidity or tenderness.  Lymphadenopathy:     Cervical: No cervical adenopathy.  Skin:    General: Skin is warm and dry.     Capillary Refill: Capillary refill takes less than 2 seconds.     Findings: No rash.  Neurological:     General: No focal deficit present.     Mental Status: He is alert and oriented to person, place, and time.  Psychiatric:        Mood and Affect: Mood normal.        Behavior: Behavior normal. Behavior is cooperative.        Thought Content: Thought content normal.        Judgment: Judgment normal.      UC Treatments / Results  Labs (all labs ordered are listed, but only abnormal results are displayed) Labs Reviewed  SARS CORONAVIRUS 2 (TAT 6-24 HRS)    EKG   Radiology DG Chest 2 View  Result Date: 09/16/2019 CLINICAL DATA:  Cough with chest pain and difficulty breathing EXAM: CHEST - 2 VIEW COMPARISON:  September 17, 2019 FINDINGS: There is airspace opacity in the left perihilar region. The lungs elsewhere are clear. The heart size and pulmonary vascularity are normal. No adenopathy. No bone lesions. IMPRESSION: Airspace opacity left perihilar region consistent with pneumonia. Lungs otherwise  clear. Cardiac silhouette within normal limits. No adenopathy appreciable. These results will be called to the ordering clinician or representative by the  Radiologist Assistant, and communication documented in the PACS or [employer]. Electronically Signed   By: Bretta Bang III M.D.   On: 09/16/2019 14:28    Procedures Procedures (including critical care time)  Medications Ordered in UC Medications - No data to display  Initial Impression / Assessment and Plan / UC Course  I have reviewed the triage vital signs and the nursing notes.  Pertinent labs & imaging results that were available during my care of the patient were reviewed by me and considered in my medical decision making (see chart for details).     Reviewed chest x-ray results with patient- left peri-hilar area with findings consistent with pneumonia. Discussed that this could be bacterial or viral- will send out COVID 19 test. Since patient has Cardiac and other co-morbidities but vital signs are stable and he is not in any distress, will provide outpatient treatment for possible bacterial pneumonia. Consulted with Renford Dills, NP and Up to Date clinical resource for appropriate antibiotic treatment. Will start Augmentin 1000mg  2 tablets twice a day as directed. Also take Doxycycline 100mg  twice a day as directed for 7 days. May use Flonase 2 sprays each nostril daily to help with nasal congestion and fluid in ears since unable to take decongestants. Continue Albuterol inhaler 2 puffs every 4 to 6 hours as needed. May use OTC Delsym 2 teaspoons every 12 hours as needed. Rest. Continue to stay well-hydrated. Stay at home until results of COVID 19 testing are available. If any increased difficulty breathing, fever, shortness of breath or increased chest pain occurs, go to the ER ASAP. Otherwise, call your PCP today to schedule appointment for recheck in 3 to 4 days or sooner if requested.  Final Clinical Impressions(s) / UC Diagnoses   Final diagnoses:  Cough  Chest pain on breathing  History of CHF (congestive heart failure)  Community acquired pneumonia of left lung,  unspecified part of lung  Ear fullness, bilateral     Discharge Instructions     Recommend start Augmentin 1000mg  2 tablets twice a day as directed. Also take Doxycycline 100mg  twice a day as directed. May use Flonase 2 sprays each nostril daily to help with ear fullness. May also use OTC Delsym cough syrup 2 teaspoons every 12 hours as needed for cough. Recommend follow-up with your PCP in 3 to 4 days for recheck or sooner if requested (call your PCP today to schedule follow-up).     ED Prescriptions    Medication Sig Dispense Auth. Provider   amoxicillin-clavulanate (AUGMENTIN XR) 1000-62.5 MG 12 hr tablet Take 2 tablets by mouth 2 (two) times daily for 7 days. 28 tablet Rylon Poitra, , NP   doxycycline (VIBRAMYCIN) 100 MG capsule Take 1 capsule (100 mg total) by mouth 2 (two) times daily for 7 days. 14 capsule , NP   fluticasone (FLONASE) 50 MCG/ACT nasal spray Place 2 sprays into both nostrils daily. 16 g , NP     PDMP not reviewed this encounter.   , NP 09/16/19 [phone]

## 2019-09-16 NOTE — Discharge Instructions (Addendum)
Recommend start Augmentin 1000mg  2 tablets twice a day as directed. Also take Doxycycline 100mg  twice a day as directed. May use Flonase 2 sprays each nostril daily to help with ear fullness. May also use OTC Delsym cough syrup 2 teaspoons every 12 hours as needed for cough. Recommend follow-up with your PCP in 3 to 4 days for recheck or sooner if requested (call your PCP today to schedule follow-up).

## 2019-09-17 LAB — SARS CORONAVIRUS 2 (TAT 6-24 HRS): SARS Coronavirus 2: POSITIVE — AB

## 2019-09-18 ENCOUNTER — Other Ambulatory Visit: Payer: Self-pay | Admitting: Infectious Diseases

## 2019-09-18 ENCOUNTER — Telehealth: Payer: Self-pay | Admitting: Adult Health

## 2019-09-18 DIAGNOSIS — U071 COVID-19: Secondary | ICD-10-CM

## 2019-09-18 DIAGNOSIS — I420 Dilated cardiomyopathy: Secondary | ICD-10-CM

## 2019-09-18 DIAGNOSIS — B20 Human immunodeficiency virus [HIV] disease: Secondary | ICD-10-CM

## 2019-09-18 NOTE — Telephone Encounter (Signed)
Called Todd Griffith back after receiving a voicemail from him -   He states he is starting to feel better. Was diagnosed with pneumonia at the ER recently and currently taking 2 antibiotics (Doxy + Augmentin). Symptoms started 6/15 and are described to be chest pain, cough and nasal congestion. Overall he feels a bit improved. CXR in the ER does look suspicious for secondary bacterial process. I asked him to complete the antibiotics he was given.   Called to discuss with patient about Covid symptoms and the use of bamlanivimab, a monoclonal antibody infusion for those with mild to moderate Covid symptoms and at a high risk of hospitalization.  Pt is qualified for this infusion at the [facility] infusion center due to Hypertension, BMI>35 and immuno-compromised    Scheduled Monday 6/21 @ 10:30

## 2019-09-18 NOTE — Progress Notes (Signed)
  I connected by phone with Todd Griffith on 09/18/2019 at 4:35 PM to discuss the potential use of an new treatment for mild to moderate COVID-19 viral infection in non-hospitalized patients.  This patient is a 29 y.o. male that meets the FDA criteria for Emergency Use Authorization of bamlanivimab/etesevimab or casirivimab/imdevimab.  Has a (+) direct SARS-CoV-2 viral test result  Has mild or moderate COVID-19   Is NOT hospitalized due to COVID-19  Is within 10 days of symptom onset  Has at least one of the high risk factor(s) for progression to severe COVID-19 and/or hospitalization as defined in EUA.  Specific high risk criteria : Immunosuppressive Disease or Treatment, cardiomyopathy, BMI 57   I have spoken and communicated the following to the patient or parent/caregiver:  1. FDA has authorized the emergency use of bamlanivimab/etesevimab and casirivimab\imdevimab for the treatment of mild to moderate COVID-19 in adults and pediatric patients with positive results of direct SARS-CoV-2 viral testing who are 26 years of age and older weighing at least 40 kg, and who are at high risk for progressing to severe COVID-19 and/or hospitalization.  2. The significant known and potential risks and benefits of bamlanivimab/etesevimab and casirivimab\imdevimab, and the extent to which such potential risks and benefits are unknown.  3. Information on available alternative treatments and the risks and benefits of those alternatives, including clinical trials.  4. Patients treated with bamlanivimab/etesevimab and casirivimab\imdevimab should continue to self-isolate and use infection control measures (e.g., wear mask, isolate, social distance, avoid sharing personal items, clean and disinfect "high touch" surfaces, and frequent handwashing) according to CDC guidelines.   5. The patient or parent/caregiver has the option to accept or refuse bamlanivimab/etesevimab or casirivimab\imdevimab  .  After reviewing this information with the patient, The patient agreed to proceed with receiving the bamlanimivab infusion and will be provided a copy of the Fact sheet prior to receiving the infusion.[name] 09/18/2019 4:35 PM

## 2019-09-18 NOTE — Telephone Encounter (Signed)
Called patient regarding his covid positivity and eligibility to receive monoclonal antibody therapy.  He is eligible due to: BMI greater than 25, HTN, CHF, HIV.  I asked that he call us back at [phone] to discuss further.  My chart message sent.  Lillard Anes, NP

## 2019-09-20 ENCOUNTER — Ambulatory Visit (HOSPITAL_COMMUNITY)
Admission: RE | Admit: 2019-09-20 | Discharge: 2019-09-20 | Disposition: A | Discharge status: Home / Self Care | Payer: BC Managed Care – PPO | Source: Ambulatory Visit | Attending: Pulmonary Disease | Admitting: Pulmonary Disease

## 2019-09-20 DIAGNOSIS — U071 COVID-19: Secondary | ICD-10-CM

## 2019-09-20 DIAGNOSIS — B20 Human immunodeficiency virus [HIV] disease: Secondary | ICD-10-CM

## 2019-09-20 DIAGNOSIS — I420 Dilated cardiomyopathy: Secondary | ICD-10-CM

## 2019-09-20 MED ORDER — SODIUM CHLORIDE 0.9 % IV SOLN
Freq: Once | INTRAVENOUS | Status: DC
  Filled 2019-09-20: qty 20

## 2019-09-21 NOTE — Telephone Encounter (Signed)
Called and spoke with patient , informed pt that  he will need to treat the his symptoms , if he starts to have sob , can't breath to call 911. Pt stated that he has taken another test it was neg. And that he given a antibiotic

## 2019-10-08 ENCOUNTER — Encounter (HOSPITAL_COMMUNITY): Payer: Self-pay | Admitting: *Deleted

## 2019-10-21 ENCOUNTER — Ambulatory Visit (INDEPENDENT_AMBULATORY_CARE_PROVIDER_SITE_OTHER): Payer: BC Managed Care – PPO | Admitting: Nurse Practitioner

## 2019-10-21 ENCOUNTER — Encounter: Payer: Self-pay | Admitting: Nurse Practitioner

## 2019-10-21 ENCOUNTER — Other Ambulatory Visit: Payer: Self-pay

## 2019-10-21 VITALS — BP 134/70 | HR 83 | Temp 98.8°F | Resp 16 | Ht 70.0 in | Wt >= 6400 oz

## 2019-10-21 DIAGNOSIS — F321 Major depressive disorder, single episode, moderate: Secondary | ICD-10-CM | POA: Diagnosis not present

## 2019-10-21 DIAGNOSIS — B20 Human immunodeficiency virus [HIV] disease: Secondary | ICD-10-CM

## 2019-10-21 DIAGNOSIS — I5041 Acute combined systolic (congestive) and diastolic (congestive) heart failure: Secondary | ICD-10-CM

## 2019-10-21 DIAGNOSIS — J4520 Mild intermittent asthma, uncomplicated: Secondary | ICD-10-CM

## 2019-10-21 NOTE — Patient Instructions (Signed)
Complicated Grief Grief is a normal response to the death of someone close to you. Feelings of fear, anger, and guilt can affect almost everyone who loses a loved one. It is also common to have symptoms of depression while you are grieving. These include problems with sleep, loss of appetite, and lack of energy. They may last for weeks or months after a loss. Complicated grief is different from normal grief or depression. Normal grieving involves sadness and feelings of loss, but those feelings get better and heal over time. Complicated grief is a severe type of grief that lasts for a long time, usually for several months to a year or longer. It interferes with your ability to function normally. Complicated grief may require treatment from a mental health care provider. What are the causes? The cause of this condition is not known. It is not clear why some people continue to struggle with grief and others do not. What increases the risk? You are more likely to develop this condition if:  The death of your loved one was sudden or unexpected.  The death of your loved one was due to a violent event.  Your loved one died from suicide.  Your loved one was a child or a young person.  You were very close to your loved one, or you were dependent on him or her.  You have a history of depression or anxiety. What are the signs or symptoms? Symptoms of this condition include:  Feeling disbelief or having a lack of emotion (numbness).  Being unable to enjoy good memories of your loved one.  Needing to avoid anything or anyone that reminds you of your loved one.  Being unable to stop thinking about the death.  Feeling intense anger or guilt.  Feeling alone and hopeless.  Feeling that your life is meaningless and empty.  Losing the desire to move on with your life. How is this diagnosed? This condition may be diagnosed based on:  Your symptoms. Complicated grief will be diagnosed if you have  ongoing symptoms of grief for 6-12 months or longer.  The effect of symptoms on your life. You may be diagnosed with this condition if your symptoms are interfering with your ability to live your life. Your health care provider may recommend that you see a mental health care provider. Many symptoms of depression are similar to the symptoms of complicated grief. It is important to be evaluated for complicated grief along with other mental health conditions. How is this treated? This condition is most commonly treated with talk therapy. This therapy is offered by a mental health specialist (psychiatrist). During therapy:  You will learn healthy ways to cope with the loss of your loved one.  Your mental health care provider may recommend antidepressant medicines. Follow these instructions at home: Lifestyle   Take care of yourself. ? Eat on a regular basis, and maintain a healthy diet. Eat plenty of fruits, vegetables, lean protein, and whole grains. ? Try to get some exercise each day. Aim for 30 minutes of exercise on most days of the week. ? Keep a consistent sleep schedule. Try to get 8 or more hours of sleep each night. ? Start doing the things that you used to enjoy.  Do not use drugs or alcohol to ease your symptoms.  Spend time with friends and loved ones. General instructions  Take over-the-counter and prescription medicines only as told by your health care provider.  Consider joining a grief (bereavement) support group   to help you deal with your loss.  Keep all follow-up visits as told by your health care provider. This is important. Contact a health care provider if:  Your symptoms prevent you from functioning normally.  Your symptoms do not get better with treatment. Get help right away if:  You have serious thoughts about hurting yourself or someone else.  You have suicidal feelings. If you ever feel like you may hurt yourself or others, or have thoughts about taking  your own life, get help right away. You can go to your nearest emergency department or call:  Your local emergency services (911 in the [place]  A suicide crisis helpline, such as the National Suicide Prevention Lifeline at 225-250-2395. This is open 24 hours a day. Summary  Complicated grief is a severe type of grief that lasts for a long time. This grief is not likely to go away on its own. Get the help you need.  Some griefs are more difficult than others and can cause this condition. You may need a certain type of treatment to help you recover if the loss of your loved one was sudden, violent, or due to suicide.  You may feel guilty about moving on with your life. Getting help does not mean that you are forgetting your loved one. It means that you are taking care of yourself.  Complicated grief is best treated with talk therapy. Medicines may also be prescribed.  Seek the help you need, and find support that will help you recover. This information is not intended to replace advice given to you by your health care provider. Make sure you discuss any questions you have with your health care provider. Document Revised: 02/28/2017 Document Reviewed: 01/01/2017 Elsevier Patient Education  2020 [employer] Living With Depression Everyone experiences occasional disappointment, sadness, and loss in their lives. When you are feeling down, blue, or sad for at least 2 weeks in a row, it may mean that you have depression. Depression can affect your thoughts and feelings, relationships, daily activities, and physical health. It is caused by changes in the way your brain functions. If you receive a diagnosis of depression, your health care provider will tell you which type of depression you have and what treatment options are available to you. If you are living with depression, there are ways to help you recover from it and also ways to prevent it from coming back. How to cope with lifestyle  changes Coping with stress     Stress is your body's reaction to life changes and events, both good and bad. Stressful situations may include:  Getting married.  The death of a spouse.  Losing a job.  Retiring.  Having a baby. Stress can last just a few hours or it can be ongoing. Stress can play a major role in depression, so it is important to learn both how to cope with stress and how to think about it differently. Talk with your health care provider or a counselor if you would like to learn more about stress reduction. He or she may suggest some stress reduction techniques, such as:  Music therapy. This can include creating music or listening to music. Choose music that you enjoy and that inspires you.  Mindfulness-based meditation. This kind of meditation can be done while sitting or walking. It involves being aware of your normal breaths, rather than trying to control your breathing.  Centering prayer. This is a kind of meditation that involves focusing on a spiritual  word or phrase. Choose a word, phrase, or sacred image that is meaningful to you and that brings you peace.  Deep breathing. To do this, expand your stomach and inhale slowly through your nose. Hold your breath for 3-5 seconds, then exhale slowly, allowing your stomach muscles to relax.  Muscle relaxation. This involves intentionally tensing muscles then relaxing them. Choose a stress reduction technique that fits your lifestyle and personality. Stress reduction techniques take time and practice to develop. Set aside 5-15 minutes a day to do them. Therapists can offer training in these techniques. The training may be covered by some insurance plans. Other things you can do to manage stress include:  Keeping a stress diary. This can help you learn what triggers your stress and ways to control your response.  Understanding what your limits are and saying no to requests or events that lead to a schedule that is too  full.  Thinking about how you respond to certain situations. You may not be able to control everything, but you can control how you react.  Adding humor to your life by watching funny films or TV shows.  Making time for activities that help you relax and not feeling guilty about spending your time this way.  Medicines Your health care provider may suggest certain medicines if he or she feels that they will help improve your condition. Avoid using alcohol and other substances that may prevent your medicines from working properly (may interact). It is also important to:  Talk with your pharmacist or health care provider about all the medicines that you take, their possible side effects, and what medicines are safe to take together.  Make it your goal to take part in all treatment decisions (shared decision-making). This includes giving input on the side effects of medicines. It is best if shared decision-making with your health care provider is part of your total treatment plan. If your health care provider prescribes a medicine, you may not notice the full benefits of it for 4-8 weeks. Most people who are treated for depression need to be on medicine for at least 6-12 months after they feel better. If you are taking medicines as part of your treatment, do not stop taking medicines without first talking to your health care provider. You may need to have the medicine slowly decreased (tapered) over time to decrease the risk of harmful side effects. Relationships Your health care provider may suggest family therapy along with individual therapy and drug therapy. While there may not be family problems that are causing you to feel depressed, it is still important to make sure your family learns as much as they can about your mental health. Having your family's support can help make your treatment successful. How to recognize changes in your condition Everyone has a different response to treatment for  depression. Recovery from major depression happens when you have not had signs of major depression for two months. This may mean that you will start to:  Have more interest in doing activities.  Feel less hopeless than you did 2 months ago.  Have more energy.  Overeat less often, or have better or improving appetite.  Have better concentration. Your health care provider will work with you to decide the next steps in your recovery. It is also important to recognize when your condition is getting worse. Watch for these signs:  Having fatigue or low energy.  Eating too much or too little.  Sleeping too much or too little.  Feeling restless, agitated, or hopeless.  Having trouble concentrating or making decisions.  Having unexplained physical complaints.  Feeling irritable, angry, or aggressive. Get help as soon as you or your family members notice these symptoms coming back. How to get support and help from others How to talk with friends and family members about your condition  Talking to friends and family members about your condition can provide you with one way to get support and guidance. Reach out to trusted friends or family members, explain your symptoms to them, and let them know that you are working with a health care provider to treat your depression. Financial resources Not all insurance plans cover mental health care, so it is important to check with your insurance carrier. If paying for co-pays or counseling services is a problem, search for a local or county mental health care center. They may be able to offer public mental health care services at low or no cost when you are not able to see a private health care provider. If you are taking medicine for depression, you may be able to get the generic form, which may be less expensive. Some makers of prescription medicines also offer help to patients who cannot afford the medicines they need. Follow these instructions at  home:   Get the right amount and quality of sleep.  Cut down on using caffeine, tobacco, alcohol, and other potentially harmful substances.  Try to exercise, such as walking or lifting small weights.  Take over-the-counter and prescription medicines only as told by your health care provider.  Eat a healthy diet that includes plenty of vegetables, fruits, whole grains, low-fat dairy products, and lean protein. Do not eat a lot of foods that are high in solid fats, added sugars, or salt.  Keep all follow-up visits as told by your health care provider. This is important. Contact a health care provider if:  You stop taking your antidepressant medicines, and you have any of these symptoms: ? Nausea. ? Headache. ? Feeling lightheaded. ? Chills and body aches. ? Not being able to sleep (insomnia).  You or your friends and family think your depression is getting worse. Get help right away if:  You have thoughts of hurting yourself or others. If you ever feel like you may hurt yourself or others, or have thoughts about taking your own life, get help right away. You can go to your nearest emergency department or call:  Your local emergency services (911 in the [place]  A suicide crisis helpline, such as the National Suicide Prevention Lifeline at 239 234 3037. This is open 24-hours a day. Summary  If you are living with depression, there are ways to help you recover from it and also ways to prevent it from coming back.  Work with your health care team to create a management plan that includes counseling, stress management techniques, and healthy lifestyle habits. This information is not intended to replace advice given to you by your health care provider. Make sure you discuss any questions you have with your health care provider. Document Revised: 07/10/2018 Document Reviewed: 02/19/2016 Elsevier Patient Education  2020 [employer].

## 2019-10-21 NOTE — Progress Notes (Signed)
[facility] [address] [place], [place]  [address] Phone:  [phone]   Fax:  [phone]   Established Patient Office Visit  Subjective:  Patient ID: Todd Griffith, male    DOB: Feb 08, 1991  Age: 29 y.o. MRN: [id number]  CC:  Chief Complaint  Patient presents with  . Follow-up    requesting fmla paperwork for life stressor     HPI [facility] presents for follow up. He  has a past medical history of Asthma, Bronchitis, CHF (congestive heart failure) (HCC), HIV (human immunodeficiency virus infection) (HCC), and Obstructive sleep apnea.    Depression Patient complains of depression. He complains of depressed mood. Onset was approximately several months ago. Symptoms have been gradually worsening since that time. Current symptoms include: anhedonia, depressed mood, difficulty concentrating, fatigue, feelings of worthlessness/guilt and insomnia. Patient denies recurrent thoughts of death, suicidal attempt, suicidal thoughts with specific plan and suicidal thoughts without plan. Family history significant for depression. Risk factors: positive family history in  grandmother and negative life event personal comorbidites. Previous treatment includes individual therapy unsure of the date.    Past Medical History:  Diagnosis Date  . Asthma   . Bronchitis   . CHF (congestive heart failure) (HCC)   . HIV (human immunodeficiency virus infection) (HCC)   . Obstructive sleep apnea     Past Surgical History:  Procedure Laterality Date  . RIGHT/LEFT HEART CATH AND CORONARY ANGIOGRAPHY N/A 09/21/2018   Procedure: RIGHT/LEFT HEART CATH AND CORONARY ANGIOGRAPHY;  Surgeon: Marykay Lex, MD;  Location: [facility] INVASIVE CV LAB;  Service: Cardiovascular;  Laterality: N/A;    Family History  Problem Relation Age of Onset  . Hypertension Mother   . Diabetes Mellitus II Father   . CAD Father        Died at 65 of 'CAD' while sleeping   . Obesity  Sister   . Colon cancer Neg Hx   . Esophageal cancer Neg Hx     Social History   Socioeconomic History  . Marital status: Significant Other    Spouse name: Not on file  . Number of children: 2  . Years of education: 16  . Highest education level: Bachelor's degree (e.g., BA, AB, BS)  Occupational History  . Occupation: Sr Orthoptist: [employer]  Tobacco Use  . Smoking status: Former Smoker    Types: Cigarettes    Quit date: 05/31/2018    Years since quitting: 1.3  . Smokeless tobacco: Never Used  Vaping Use  . Vaping Use: Former  Substance and Sexual Activity  . Alcohol use: Yes    Alcohol/week: 3.0 standard drinks    Types: 3 Cans of beer per week    Comment: socially   . Drug use: Not Currently  . Sexual activity: Yes    Partners: Female  Other Topics Concern  . Not on file  Social History Narrative  . Not on file   Social Determinants of Health   Financial Resource Strain:   . Difficulty of Paying Living Expenses:   Food Insecurity:   . Worried About Programme researcher, broadcasting/film/video in the Last Year:   . Barista in the Last Year:   Transportation Needs:   . Freight forwarder (Medical):   Marland Kitchen Lack of Transportation (Non-Medical):   Physical Activity:   . Days of Exercise per Week:   . Minutes of Exercise per Session:  Stress:   . Feeling of Stress :   Social Connections:   . Frequency of Communication with Friends and Family:   . Frequency of Social Gatherings with Friends and Family:   . Attends Religious Services:   . Active Member of Clubs or Organizations:   . Attends Banker Meetings:   Marland Kitchen Marital Status:   Intimate Partner Violence:   . Fear of Current or Ex-Partner:   . Emotionally Abused:   Marland Kitchen Physically Abused:   . Sexually Abused:     Outpatient Medications Prior to Visit  Medication Sig Dispense Refill  . albuterol (VENTOLIN HFA) 108 (90 Base) MCG/ACT inhaler Inhale 1-2 puffs into the lungs every 6  (six) hours as needed for wheezing or shortness of breath. 6.7 g 2  . aspirin 81 MG chewable tablet Chew 1 tablet (81 mg total) by mouth daily. 30 tablet 3  . BIKTARVY 50-200-25 MG TABS tablet TAKE 1 TABLET BY MOUTH EVERY DAY 30 tablet 4  . bisoprolol (ZEBETA) 10 MG tablet Take 1 tablet (10 mg total) by mouth daily. 30 tablet 5  . fluticasone (FLONASE) 50 MCG/ACT nasal spray Place 2 sprays into both nostrils daily. 16 g 0  . furosemide (LASIX) 80 MG tablet Take 1 tablet (80 mg total) by mouth daily. Take one TAB every morning. 30 tablet 3  . isosorbide-hydrALAZINE (BIDIL) 20-37.5 MG tablet Take 2 tablets by mouth 3 (three) times daily. 180 tablet 5  . sacubitril-valsartan (ENTRESTO) 97-103 MG Take 2 tablets by mouth 2 (two) times daily.    Marland Kitchen spironolactone (ALDACTONE) 25 MG tablet TAKE 1 TABLET BY MOUTH EVERY DAY 90 tablet 1  . atorvastatin (LIPITOR) 20 MG tablet Take 1 tablet (20 mg total) by mouth daily. 90 tablet 3   No facility-administered medications prior to visit.    No Known Allergies  ROS Review of Systems  All other systems reviewed and are negative.     Objective:    Physical Exam Constitutional:      General: He is not in acute distress.    Appearance: He is obese.  HENT:     Head: Normocephalic and atraumatic.     Nose: Nose normal.     Mouth/Throat:     Mouth: Mucous membranes are moist.  Cardiovascular:     Rate and Rhythm: Normal rate and regular rhythm.     Pulses: Normal pulses.     Heart sounds: Normal heart sounds.  Pulmonary:     Breath sounds: Wheezing present.  Musculoskeletal:        General: Normal range of motion.     Cervical back: Normal range of motion.  Skin:    General: Skin is warm.  Neurological:     General: No focal deficit present.     Mental Status: He is alert and oriented to person, place, and time.  Psychiatric:        Mood and Affect: Mood normal.        Behavior: Behavior normal.        Thought Content: Thought content  normal.        Judgment: Judgment normal.     BP (!) 134/70 (BP Location: Right Arm, Patient Position: Sitting, Cuff Size: Large)   Pulse 83   Temp 98.8 F (37.1 C) (Oral)   Resp 16   Ht 5\' 10"  (1.778 m)   Wt (!) 408 lb (185.1 kg)   SpO2 97%   BMI 58.54 kg/m  Wt Readings from  Last 3 Encounters:  10/21/19 (!) 408 lb (185.1 kg)  09/16/19 (!) 403 lb (182.8 kg)  08/18/19 (!) 403 lb (182.8 kg)     There are no preventive care reminders to display for this patient.  There are no preventive care reminders to display for this patient.  Lab Results  Component Value Date   TSH 2.117 09/18/2018   Lab Results  Component Value Date   WBC 6.9 09/20/2018   HGB 13.7 09/22/2018   HCT 41.0 09/21/2018   MCV 90.9 09/20/2018   PLT 245 09/20/2018   Lab Results  Component Value Date   NA 138 08/18/2019   K 4.2 08/18/2019   CO2 23 08/18/2019   GLUCOSE 168 (H) 08/18/2019   BUN 17 08/18/2019   CREATININE 1.36 (H) 08/18/2019   BILITOT 1.0 02/09/2019   ALKPHOS 73 01/25/2019   AST 23 02/09/2019   ALT 31 02/09/2019   PROT 6.9 02/09/2019   ALBUMIN 3.7 01/25/2019   CALCIUM 9.0 08/18/2019   ANIONGAP 10 08/18/2019   Lab Results  Component Value Date   CHOL 144 01/25/2019   Lab Results  Component Value Date   HDL 26 (L) 01/25/2019   Lab Results  Component Value Date   LDLCALC 73 01/25/2019   Lab Results  Component Value Date   TRIG 226 (H) 01/25/2019   Lab Results  Component Value Date   CHOLHDL 5.5 01/25/2019   Lab Results  Component Value Date   HGBA1C 4.5 (L) 09/18/2018      Assessment & Plan:   Problem List Items Addressed This Visit      Cardiovascular and Mediastinum   Acute combined systolic and diastolic heart failure (HCC) Continue current regimen and follow up with cardiology     Respiratory   Uncomplicated asthma Continue current regimen     Other   HIV disease (HCC) Continue current regimen with ID    Morbid obesity (HCC)  Encourage nutrition  consult: patient has pending contact with employer Encourage lifestyle modifications  Education information provided    Other Visit Diagnoses    Current moderate episode of major depressive disorder without prior episode (HCC)     FMLA forms completed Pt has upcoming contact for counselor with employer Informed pt if further assistance is needed contact office Education information provided      No orders of the defined types were placed in this encounter.   Follow-up: Return in about 4 weeks (around 11/18/2019).    Barbette Merino, NP

## 2019-10-22 ENCOUNTER — Other Ambulatory Visit (HOSPITAL_COMMUNITY): Payer: Self-pay | Admitting: Adult Health

## 2019-11-15 ENCOUNTER — Encounter: Payer: Self-pay | Admitting: Nurse Practitioner

## 2019-11-15 ENCOUNTER — Ambulatory Visit (INDEPENDENT_AMBULATORY_CARE_PROVIDER_SITE_OTHER): Payer: BC Managed Care – PPO | Admitting: Nurse Practitioner

## 2019-11-15 ENCOUNTER — Other Ambulatory Visit: Payer: Self-pay

## 2019-11-15 VITALS — BP 114/51 | HR 98 | Temp 97.2°F | Ht 70.0 in | Wt >= 6400 oz

## 2019-11-15 DIAGNOSIS — B20 Human immunodeficiency virus [HIV] disease: Secondary | ICD-10-CM

## 2019-11-15 DIAGNOSIS — F321 Major depressive disorder, single episode, moderate: Secondary | ICD-10-CM

## 2019-11-15 DIAGNOSIS — I5041 Acute combined systolic (congestive) and diastolic (congestive) heart failure: Secondary | ICD-10-CM | POA: Diagnosis not present

## 2019-11-15 MED ORDER — BISOPROLOL FUMARATE 10 MG PO TABS: 10.0000 mg | ORAL_TABLET | Freq: Every day | ORAL | 11 refills | Status: DC

## 2019-11-15 NOTE — Progress Notes (Signed)
Todd Griffith, [place]  [id number] Phone:  [phone]   Fax:  [phone]   Established Patient Office Visit  Subjective:  Patient ID: Todd Griffith, male    DOB: 07-21-1990  Age: 29 y.o. MRN: [id number]  CC:  Chief Complaint  Patient presents with  . Follow-up    4 week f/u     HPI [facility] presents for follow up. He  has a past medical history of Asthma, Bronchitis, CHF (congestive heart failure) ([place]), HIV (human immunodeficiency virus infection) ([place]), and Obstructive sleep apnea.   Depression Patient complains of depression. His one year old daughter was recently hospitalized for asthma exacerbation that was severe. She is progressing along fine but this was a " blow".   He complains of depressed mood. Onset was approximately several months ago. Symptoms have been unchanged since that time. Current symptoms include: depressed mood. Patient denies anhedonia, feelings of worthlessness/guilt, hopelessness, hypersomnia, impaired memory, insomnia, psychomotor agitation, psychomotor retardation, recurrent thoughts of death, suicidal attempt, suicidal thoughts with specific plan, suicidal thoughts without plan and weight loss. Family history significant for depression. Possible organic causes contributing are: none. Risk factors: negative life event Chronic comorbidities. Previous treatment includes individual therapy. He has not started a treatment with his employer at this time. He admits that he was trying to make steps forward and lifestyle modification.  He is eat more salads.   Past Medical History:  Diagnosis Date  . Asthma   . Bronchitis   . CHF (congestive heart failure) ([place])   . HIV (human immunodeficiency virus infection) ([place])   . Obstructive sleep apnea     Past Surgical History:  Procedure Laterality Date  . RIGHT/LEFT HEART CATH AND CORONARY ANGIOGRAPHY N/A 09/21/2018   Procedure: RIGHT/LEFT HEART CATH  AND CORONARY ANGIOGRAPHY;  Surgeon: Leonie Man, MD;  Location: [place] [facility];  Service: Cardiovascular;  Laterality: N/A;    Family History  Problem Relation Age of Onset  . Hypertension Mother   . Diabetes Mellitus II Father   . CAD Father        Died at 27 of 'CAD' while sleeping   . Obesity Sister   . Colon cancer Neg Hx   . Esophageal cancer Neg Hx     Social History   Socioeconomic History  . Marital status: Significant Other    Spouse name: Not on file  . Number of children: 2  . Years of education: 16  . Highest education level: Bachelor's degree (e.g., BA, AB, BS)  Occupational History  . Occupation: Sr Product/process development scientist: [employer]  Tobacco Use  . Smoking status: Former Smoker    Types: Cigarettes    Quit date: 05/31/2018    Years since quitting: 1.4  . Smokeless tobacco: Never Used  Vaping Use  . Vaping Use: Former  Substance and Sexual Activity  . Alcohol use: Yes    Alcohol/week: 3.0 standard drinks    Types: 3 Cans of beer per week    Comment: socially   . Drug use: Not Currently  . Sexual activity: Yes    Partners: Female  Other Topics Concern  . Not on file  Social History Narrative  . Not on file   Social Determinants of Health   Financial Resource Strain:   . Difficulty of Paying Living Expenses:   Food Insecurity:   . Worried About Charity fundraiser in the  Last Year:   . [place] in the Last Year:   Transportation Needs:   . Film/video editor (Medical):   Marland Kitchen Lack of Transportation (Non-Medical):   Physical Activity:   . Days of Exercise per Week:   . Minutes of Exercise per Session:   Stress:   . Feeling of Stress :   Social Connections:   . Frequency of Communication with Friends and Family:   . Frequency of Social Gatherings with Friends and Family:   . Attends Religious Services:   . Active Member of Clubs or Organizations:   . Attends Archivist Meetings:   Marland Kitchen Marital Status:     Intimate Partner Violence:   . Fear of Current or Ex-Partner:   . Emotionally Abused:   Marland Kitchen Physically Abused:   . Sexually Abused:     Outpatient Medications Prior to Visit  Medication Sig Dispense Refill  . albuterol (VENTOLIN HFA) 108 (90 Base) MCG/ACT inhaler Inhale 1-2 puffs into the lungs every 6 (six) hours as needed for wheezing or shortness of breath. 6.7 g 2  . aspirin 81 MG chewable tablet Chew 1 tablet (81 mg total) by mouth daily. 30 tablet 3  . atorvastatin (LIPITOR) 20 MG tablet TAKE 1 TABLET BY MOUTH EVERY DAY 90 tablet 3  . BIKTARVY 50-200-25 MG TABS tablet TAKE 1 TABLET BY MOUTH EVERY DAY 30 tablet 4  . fluticasone (FLONASE) 50 MCG/ACT nasal spray Place 2 sprays into both nostrils daily. 16 g 0  . furosemide (LASIX) 80 MG tablet Take 1 tablet (80 mg total) by mouth daily. Take one TAB every morning. 30 tablet 3  . isosorbide-hydrALAZINE (BIDIL) 20-37.5 MG tablet Take 2 tablets by mouth 3 (three) times daily. 180 tablet 5  . sacubitril-valsartan (ENTRESTO) 97-103 MG Take 2 tablets by mouth 2 (two) times daily.    Marland Kitchen spironolactone (ALDACTONE) 25 MG tablet TAKE 1 TABLET BY MOUTH EVERY DAY 90 tablet 1  . bisoprolol (ZEBETA) 10 MG tablet Take 1 tablet (10 mg total) by mouth daily. 30 tablet 5  . ENTRESTO 49-51 MG Take 1 tablet by mouth 2 (two) times daily.    . ID NOW COVID-19 KIT See admin instructions.     No facility-administered medications prior to visit.    No Known Allergies  ROS Review of Systems  All other systems reviewed and are negative.     Objective:    Physical Exam Constitutional:      General: He is not in acute distress.    Appearance: He is obese. He is not ill-appearing, toxic-appearing or diaphoretic.  HENT:     Head: Normocephalic and atraumatic.     Nose: Nose normal.     Mouth/Throat:     Mouth: Mucous membranes are moist.  Cardiovascular:     Rate and Rhythm: Normal rate and regular rhythm.     Pulses: Normal pulses.     Heart  sounds: Normal heart sounds.  Pulmonary:     Effort: Pulmonary effort is normal.     Breath sounds: Normal breath sounds.  Musculoskeletal:        General: Normal range of motion.     Cervical back: Normal range of motion.  Skin:    General: Skin is warm and dry.     Capillary Refill: Capillary refill takes less than 2 seconds.  Neurological:     General: No focal deficit present.     Mental Status: He is alert and oriented to  person, place, and time.  Psychiatric:        Mood and Affect: Mood normal.        Behavior: Behavior normal.        Thought Content: Thought content normal.        Judgment: Judgment normal.     BP (!) 114/51   Pulse 98   Temp (!) 97.2 F (36.2 C)   Ht 5' 10"  (1.778 m)   Wt (!) 418 lb (189.6 kg)   SpO2 99%   BMI 59.98 kg/m  Wt Readings from Last 3 Encounters:  11/15/19 (!) 418 lb (189.6 kg)  10/21/19 (!) 408 lb (185.1 kg)  09/16/19 (!) 403 lb (182.8 kg)     Health Maintenance Due  Topic Date Due  . COVID-19 Vaccine (1) Never done  . INFLUENZA VACCINE  10/31/2019    There are no preventive care reminders to display for this patient.  Lab Results  Component Value Date   TSH 2.117 09/18/2018   Lab Results  Component Value Date   WBC 6.9 09/20/2018   HGB 13.7 09/22/2018   HCT 41.0 09/21/2018   MCV 90.9 09/20/2018   PLT 245 09/20/2018   Lab Results  Component Value Date   NA 138 08/18/2019   K 4.2 08/18/2019   CO2 23 08/18/2019   GLUCOSE 168 (H) 08/18/2019   BUN 17 08/18/2019   CREATININE 1.36 (H) 08/18/2019   BILITOT 1.0 02/09/2019   ALKPHOS 73 01/25/2019   AST 23 02/09/2019   ALT 31 02/09/2019   PROT 6.9 02/09/2019   ALBUMIN 3.7 01/25/2019   CALCIUM 9.0 08/18/2019   ANIONGAP 10 08/18/2019   Lab Results  Component Value Date   CHOL 144 01/25/2019   Lab Results  Component Value Date   HDL 26 (L) 01/25/2019   Lab Results  Component Value Date   LDLCALC 73 01/25/2019   Lab Results  Component Value Date   TRIG 226  (H) 01/25/2019   Lab Results  Component Value Date   CHOLHDL 5.5 01/25/2019   Lab Results  Component Value Date   HGBA1C 4.5 (L) 09/18/2018      Assessment & Plan:   Problem List Items Addressed This Visit      Cardiovascular and Mediastinum   Acute combined systolic and diastolic heart failure ([place]) Follow-up with cardiology as scheduled Refill on bisoprolol to maintain compliance   Relevant Medications   bisoprolol (ZEBETA) 10 MG tablet   ENTRESTO 49-51 MG     Other   HIV disease ([place]) Continue to follow-up with infectious disease as scheduled   Morbid obesity ([place]) Obesity with BMI and comorbidities as noted above.  Discussed proper diet (low fat, low sodium, high fiber) with patient.   Discussed need for regular exercise (3 times per week, 20 minutes per session) with patient.     Other Visit Diagnoses    Current moderate episode of major depressive disorder without prior episode [facility])    -  Primary Encourage patient to establish connection with counselor to start ongoing counseling sessions especially with the recent stressor so that he will be able to return back to work in September      Meds ordered this encounter  Medications  . bisoprolol (ZEBETA) 10 MG tablet    Sig: Take 1 tablet (10 mg total) by mouth daily.    Dispense:  30 tablet    Refill:  11    Follow-up: Return in about 3 months (around 02/15/2020) for  With [place] thanks.    Vevelyn Francois, NP

## 2019-11-15 NOTE — Patient Instructions (Signed)
Mania Mania is a condition that affects people who have certain mood disorders. Mania involves episodes of emotional highs that include having very high energy, racing thoughts, very high self-esteem, and decreased ability to concentrate. These episodes are very intense and can last longer than a week. In some cases, episodes of mania can be so strong that people with this condition need to be hospitalized for their safety and the safety of people around them. What are the causes? The cause of this condition is not known. What increases the risk? You are more likely to develop mania if you have a mood disorder, especially bipolar disorder. If you have a mood disorder, the following factors may increase your risk of developing mania:  Not getting enough sleep.  Using substances such as tobacco, caffeine, or illegal drugs.  Certain prescription medicines, such as antidepressants or antibiotics.  Stress or emotional events.  Certain seasons. Mania is more common in spring and summer.  The period of time after having a baby (postpartum period). What are the signs or symptoms? Symptoms of this condition include:  Periods of having very high energy that may last longer than a week. In some cases, you have so much energy that you may become unsafe and need to go to the hospital.  Very high self-esteem or self-confidence.  Decreased need for sleep.  Being unusually talkative, or feeling a need to keep talking. Speech may be very fast. It may seem like you cannot stop talking.  Racing thoughts or constant talking, with quick shifts between topics that may or may not be related (flight of ideas).  Decreased ability to focus or concentrate.  Increased purposeful activity, such as work, study, or social activity.  Increased nonproductive activity. This could be pacing, squirming and fidgeting, or finger and toe tapping.  Impulsive behavior and poor judgment. This may result in high-risk  activities, such as having unprotected sex or spending a lot of money.  Having false beliefs (delusions) or seeing, hearing, or feeling things that do not exist (hallucinations). How is this diagnosed? This condition may be diagnosed based on:  Your symptoms and medical history.  A physical exam. Your health care provider will check for physical conditions that may be causing your symptoms.  A mental health evaluation. You may be referred to a mental health provider who specializes in diagnosing and treating mood disorders. How is this treated? This condition may be treated with:  Medicines, such as mood stabilizers.  Talk therapy (psychotherapy) with a mental health provider.  A procedure to change the brain chemicals that send messages between brain cells (neurotransmitters). This procedure, called electroconvulsive therapy (ECT), applies short electrical pulses to the brain through the scalp. This may be used in cases of severe mania when other treatments have not helped. Follow these instructions at home:  Take over-the-counter and prescription medicines only as told by your health care provider.  Try to go to sleep and wake up at the same time every day.  Make and follow a routine for daily meal times.  Ask for support from family, friends, or relatives to make sure you stay on track with your treatment.  Keep all follow-up visits as told by your health care provider. This is important. Contact a health care provider if:  You have concerns about your treatment.  You have side effects from your prescription medicines.  Your symptoms do not improve or they get worse.  Your mania may be putting your health, or others' health,   at risk. Get help right away if:  You think about hurting yourself or you try to hurt yourself.  You think about suicide. If you ever feel like you may hurt yourself or others, or have thoughts about taking your own life, get help right away. You  can go to your nearest emergency department or call:  Your local emergency services (911 in the [place]  A suicide crisis helpline, such as the National Suicide Prevention Lifeline at 1-800-273-8255. This is open 24 hours a day. Summary  Mania involves episodes of emotional highs that include having very high energy, racing thoughts, very high self-esteem, and decreased ability to concentrate.  Episodes of mania are very intense and can last longer than a week.  Treatment for mania may include medicines and talk therapy (psychotherapy). This information is not intended to replace advice given to you by your health care provider. Make sure you discuss any questions you have with your health care provider. Document Revised: 07/10/2018 Document Reviewed: 07/12/2016 Elsevier Patient Education  2020 Elsevier Inc.  

## 2019-11-16 ENCOUNTER — Encounter: Payer: Self-pay | Admitting: Nurse Practitioner

## 2019-11-18 ENCOUNTER — Encounter (HOSPITAL_COMMUNITY): Payer: Self-pay

## 2019-11-18 ENCOUNTER — Ambulatory Visit (HOSPITAL_COMMUNITY)
Admission: RE | Admit: 2019-11-18 | Discharge: 2019-11-18 | Disposition: A | Discharge status: Home / Self Care | Payer: BC Managed Care – PPO | Source: Ambulatory Visit | Attending: Adult Health | Admitting: Adult Health

## 2019-11-18 ENCOUNTER — Other Ambulatory Visit: Payer: Self-pay

## 2019-11-18 VITALS — BP 118/78 | HR 104 | Wt >= 6400 oz

## 2019-11-18 DIAGNOSIS — I5042 Chronic combined systolic (congestive) and diastolic (congestive) heart failure: Secondary | ICD-10-CM

## 2019-11-18 DIAGNOSIS — F1010 Alcohol abuse, uncomplicated: Secondary | ICD-10-CM | POA: Diagnosis not present

## 2019-11-18 DIAGNOSIS — Z7982 Long term (current) use of aspirin: Secondary | ICD-10-CM | POA: Insufficient documentation

## 2019-11-18 DIAGNOSIS — I428 Other cardiomyopathies: Secondary | ICD-10-CM | POA: Diagnosis not present

## 2019-11-18 DIAGNOSIS — G4733 Obstructive sleep apnea (adult) (pediatric): Secondary | ICD-10-CM | POA: Diagnosis not present

## 2019-11-18 DIAGNOSIS — I110 Hypertensive heart disease with heart failure: Secondary | ICD-10-CM | POA: Diagnosis not present

## 2019-11-18 DIAGNOSIS — J45909 Unspecified asthma, uncomplicated: Secondary | ICD-10-CM | POA: Insufficient documentation

## 2019-11-18 DIAGNOSIS — Z79899 Other long term (current) drug therapy: Secondary | ICD-10-CM | POA: Diagnosis not present

## 2019-11-18 DIAGNOSIS — I2510 Atherosclerotic heart disease of native coronary artery without angina pectoris: Secondary | ICD-10-CM | POA: Diagnosis not present

## 2019-11-18 DIAGNOSIS — B20 Human immunodeficiency virus [HIV] disease: Secondary | ICD-10-CM | POA: Insufficient documentation

## 2019-11-18 DIAGNOSIS — I5022 Chronic systolic (congestive) heart failure: Secondary | ICD-10-CM | POA: Diagnosis not present

## 2019-11-18 DIAGNOSIS — I340 Nonrheumatic mitral (valve) insufficiency: Secondary | ICD-10-CM | POA: Insufficient documentation

## 2019-11-18 LAB — CBC
HCT: 37.4 % — ABNORMAL LOW (ref 39.0–52.0)
Hemoglobin: 12.1 g/dL — ABNORMAL LOW (ref 13.0–17.0)
MCH: 31.9 pg (ref 26.0–34.0)
MCHC: 32.4 g/dL (ref 30.0–36.0)
MCV: 98.7 fL (ref 80.0–100.0)
Platelets: 261 10*3/uL (ref 150–400)
RBC: 3.79 MIL/uL — ABNORMAL LOW (ref 4.22–5.81)
RDW: 13.2 % (ref 11.5–15.5)
WBC: 7.2 10*3/uL (ref 4.0–10.5)
nRBC: 0 % (ref 0.0–0.2)

## 2019-11-18 LAB — COMPREHENSIVE METABOLIC PANEL
ALT: 42 U/L (ref 0–44)
AST: 39 U/L (ref 15–41)
Albumin: 3.8 g/dL (ref 3.5–5.0)
Alkaline Phosphatase: 61 U/L (ref 38–126)
Anion gap: 10 (ref 5–15)
BUN: 15 mg/dL (ref 6–20)
CO2: 21 mmol/L — ABNORMAL LOW (ref 22–32)
Calcium: 9.3 mg/dL (ref 8.9–10.3)
Chloride: 105 mmol/L (ref 98–111)
Creatinine, Ser: 1.38 mg/dL — ABNORMAL HIGH (ref 0.61–1.24)
GFR calc Af Amer: >60 mL/min (ref >60)
GFR calc non Af Amer: >60 mL/min (ref >60)
Glucose, Bld: 98 mg/dL (ref 70–99)
Potassium: 4.4 mmol/L (ref 3.5–5.1)
Sodium: 136 mmol/L (ref 135–145)
Total Bilirubin: 1 mg/dL (ref 0.3–1.2)
Total Protein: 7.4 g/dL (ref 6.5–8.1)

## 2019-11-18 LAB — LIPID PANEL
Cholesterol: 107 mg/dL (ref 0–200)
HDL: 30 mg/dL — ABNORMAL LOW (ref >40)
LDL Cholesterol: 54 mg/dL (ref 0–99)
Total CHOL/HDL Ratio: 3.6 RATIO
Triglycerides: 116 mg/dL (ref <150)
VLDL: 23 mg/dL (ref 0–40)

## 2019-11-18 LAB — HEMOGLOBIN A1C
Hgb A1c MFr Bld: 4.8 % (ref 4.8–5.6)
Mean Plasma Glucose: 91.06 mg/dL

## 2019-11-18 MED ORDER — DAPAGLIFLOZIN PROPANEDIOL 10 MG PO TABS
10.0000 mg | ORAL_TABLET | Freq: Every day | ORAL | 3 refills | Status: DC
Start: 2019-11-18 — End: 2020-03-29

## 2019-11-18 NOTE — Patient Instructions (Signed)
Lab work done today. We will notify you of any abnormal lab work. No news is good news!  Lab work will need to be done again in 1 week.  START Farxiga 10mg  tab daily.  Please follow up with the [facility] Pharmacist in 2-3 weeks.  Please follow up with the [facility] in 8 weeks.  At the [facility], you and your health needs are our priority. As part of our continuing mission to provide you with exceptional heart care, we have created designated Provider Care Teams. These Care Teams include your primary Cardiologist (physician) and Advanced Practice Providers (APPs- Physician Assistants and Nurse Practitioners) who all work together to provide you with the care you need, when you need it.   You may see any of the following providers on your designated Care Team at your next follow up: Dr Marland Kitchen . Dr Arvilla Meres . Marca Ancona, NP . Tonye Becket, PA . Robbie Lis, PharmD   Please be sure to bring in all your medications bottles to every appointment.

## 2019-11-18 NOTE — Progress Notes (Signed)
PCP: Dorena Dew, FNP Cardiology: Dr. Aundra Dubin  29 y.o. with history of asthma, OSA, HIV+, and nonischemic cardiomyopathy returns for followup of CHF.  He was admitted in 6/20 with dyspnea and found to have CHF.  Echo showed EF 15-20% with diffuse hypokinesis, moderate-severe MR. LHC/RHC showed mild coronary disease, preserved cardiac output.  He was also diagnosed with HIV and started on Biktarvy. Also w/ OSA on CPAP and heavy ETOH use (drinks ~1 pint liquor/week).   Echo in 10/20 showed EF up to 35-40%, MR is only trivial.  Echo in 2/21 showed EF 35-40% with normal RV. No cMRI due to size.   He was seen in the ED 6/21 for cough, nasal congestion and pleuritic CP. Had + COVID test w/ CXR c/w PNA but did not require hospitalization. He quarantined at home and treated w/ outpatient abx. He has recovered w/o any residual symptoms.   He returns to clinic for 3 month f/u. Last seen by Dr. Aundra Dubin 5/21 and was stable at that time w/ NYHA II symptoms and euvolemic.  Today in f/u, he reports fairly stable symptoms. Ok w/ ADLs, gets mildly SOB w/ mild-moderate level activities. No resting dyspnea. His wt is up 8 lb from previous visit, from 403>>411 lb today. Reports full compliance w/ medications. BP well controlled. EKG shows sinus tach 100 bpm.   Labs (9/20): K 4.2, creatinine 1.48 Labs (10/20): LDL 73, HDL 26 Labs (11/20): K 4.1, creatinine 1.39 Labs (12/20): K 4.2, creatinine 1.43 Labs (5/21):  K 4.2, creatinine 1.36    PMH: 1. Chronic systolic CHF: Nonischemic cardiomyopathy.   - LHC/RHC (6/20): mean RA 4, mean PCWP 21, CI 3; 70% D1 stenosis.  - Echo (6/20): EF 15-20%, normal RV, moderate-severe MR.  - Echo (10/20): EF 35-40% with diffuse hypokinesis, normal RV, trivial MR.   - Echo (2/21): EF 35-40%, diffuse hypokinesis, normal RV, normal IVC 2. HIV+: Followed by ID.  3. H/o ETOH abuse.  4. Asthma: Since childhood.  5. HTN 6. Morbid obesity.  7. OSA: Uses CPAP.  8. CAD: LHC in 6/20  with 70% D1 stenosis.   FH: Father, grandf ather with CAD, no cardiomyopathy.   SH: Lives in [place] with girlfriend and child, works for [employer] [place].  Nonsmoker.  Prior heavy ETOH, now drinks some on weekend but much less. Nonsmoker, no drugs.   ROS: All systems reviewed and negative except as per HPI.   Current Outpatient Medications  Medication Sig Dispense Refill  . albuterol (VENTOLIN HFA) 108 (90 Base) MCG/ACT inhaler Inhale 1-2 puffs into the lungs every 6 (six) hours as needed for wheezing or shortness of breath. 6.7 g 2  . aspirin 81 MG chewable tablet Chew 1 tablet (81 mg total) by mouth daily. 30 tablet 3  . atorvastatin (LIPITOR) 20 MG tablet TAKE 1 TABLET BY MOUTH EVERY DAY 90 tablet 3  . BIKTARVY 50-200-25 MG TABS tablet TAKE 1 TABLET BY MOUTH EVERY DAY 30 tablet 4  . bisoprolol (ZEBETA) 10 MG tablet Take 1 tablet (10 mg total) by mouth daily. 30 tablet 11  . ENTRESTO 49-51 MG Take 1 tablet by mouth 2 (two) times daily.    . fluticasone (FLONASE) 50 MCG/ACT nasal spray Place 2 sprays into both nostrils daily. 16 g 0  . furosemide (LASIX) 80 MG tablet Take 1 tablet (80 mg total) by mouth daily. Take one TAB every morning. 30 tablet 3  . ID NOW COVID-19 KIT See admin instructions.    Marland Kitchen  isosorbide-hydrALAZINE (BIDIL) 20-37.5 MG tablet Take 2 tablets by mouth 3 (three) times daily. 180 tablet 5  . sacubitril-valsartan (ENTRESTO) 97-103 MG Take 2 tablets by mouth 2 (two) times daily.    Marland Kitchen spironolactone (ALDACTONE) 25 MG tablet TAKE 1 TABLET BY MOUTH EVERY DAY 90 tablet 1   No current facility-administered medications for this encounter.   BP 118/78   Pulse (!) 104   Wt (!) 187.4 kg (413 lb 3.2 oz)   SpO2 98%   BMI 59.29 kg/m  PHYSICAL EXAM:  General:  Obese but well appearing. No respiratory difficulty HEENT: normal Neck: supple. Thick neck,  JVD not well visualized. Carotids 2+ bilat; no bruits. No lymphadenopathy or thyromegaly appreciated. Cor: PMI nondisplaced.  Regular rate & rhythm. No rubs, gallops or murmurs. Lungs: clear Abdomen: soft, nontender, nondistended. No hepatosplenomegaly. No bruits or masses. Good bowel sounds. Extremities: no cyanosis, clubbing, rash, edema Neuro: alert & oriented x 3, cranial nerves grossly intact. moves all 4 extremities w/o difficulty. Affect pleasant.   Assessment/Plan: 1. Chronic systolic CHF: Nonischemic cardiomyopathy.  Echo in 6/20 showed EF 15-20% with moderate-severe MR.  RHC/LHC indicated nonischemic cardiomyopathy, cardiac output preserved. Cause of cardiomyopathy is uncertain, he was a heavy drinker in the past.  He has also been found to have HIV, which can cause a cardiomyopathy.  Echo in 10/20 showed EF improved up to 35-40%.  Echo in 2/21 showed EF 35-40% still.  Volume status assessment difficult w/ body habitus/obesity, but his wt is up 8 lb from last visit 3 months ago. NYHA II-III symptoms.  - Continue bisoprolol 10 mg daily.  - Continue Entresto 97/103 bid.  - Continue spironolactone 25 mg daily.  - Continue Bidil  2 tabs tid.   - Continue current Lasix, 80 mg daily.   - Add Fargixa 10 mg daily (check Hgb A1c today, last on file in 2020 was 4.5) - Check BMP today and again in 7 days  - I think that he is just out of the range for ICD.    - unable to complete cardiac MRI due to size  2. CAD: LHC (6/20) showed 70% D1 stenosis.  - denies CP   - Continue ASA 81 daily.  - Continue atorvastatin 20 mg daily.  - Check FLP and HFTs today  3. Mitral regurgitation: Much improved on most recent echoes.  4. ETOH abuse: Prior ETOH abuse may have contributed to his cardiomyopathy. Still drinking about 1/2 pint liquor/week, has reduced intake over the last 3 months, per pt report 5. OSA: on CPAP, reports nightly compliance  6. Obesity: He is interested in bariatric surgery.  - trying to lose wt - start Fargixa given concomitant systolic HF   F/u w/ PharmD in 2 weeks. F/u w/ APP in 6 weeks.   Lyda Jester, PA-C  11/18/2019

## 2019-11-24 ENCOUNTER — Other Ambulatory Visit (HOSPITAL_COMMUNITY): Payer: BC Managed Care – PPO

## 2019-11-29 ENCOUNTER — Telehealth (HOSPITAL_COMMUNITY): Payer: Self-pay | Admitting: Vascular Surgery

## 2019-11-29 NOTE — Telephone Encounter (Signed)
Pt sent MYchart message to resched missed lab appt from 8/25, left pt VM to call back to reschedule

## 2019-12-03 NOTE — Progress Notes (Signed)
PCP: Massie Maroon, FNP Cardiology: Dr. Shirlee Latch  HPI:  29 y.o. with history of asthma, OSA, HIV+, and nonischemic cardiomyopathy returns for followup of CHF.  He was admitted in 6/20 with dyspnea and found to have CHF.  Echo showed EF 15-20% with diffuse hypokinesis, moderate-severe MR. LHC/RHC showed mild coronary disease, preserved cardiac output.  He was also diagnosed with HIV and started on Biktarvy. Also w/ OSA on CPAP and heavy ETOH use (drinks ~1 pint liquor/week).   Echo in 10/20 showed EF up to 35-40%, MR is only trivial.  Echo in 2/21 showed EF 35-40% with normal RV. No cMRI due to size.   He was seen in the ED 6/21 for cough, nasal congestion and pleuritic CP. Had + COVID test w/ CXR c/w PNA but did not require hospitalization. He quarantined at home and was treated with outpatient abx. He has recovered w/o any residual symptoms.   He recently returned to HF clinic for 3 month f/u on 11/18/19. Last seen by Dr. Shirlee Latch on 5/21 and was stable at that time with NYHA II symptoms and was euvolemic. Reported fairly stable symptoms. Ok with ADLs, gets mildly SOB with mild-moderate level activities. No resting dyspnea. His weight was up 8 lbs from previous visit, from 403>>411 lb today. Reported full compliance with medications. BP well controlled. EKG showed sinus tach 100 bpm.   Today he returns to HF clinic for pharmacist medication titration. At last visit with PA-C, dapagliflozin 10 mg daily was initiated. Overall he is feeling well today. He states that he has been out of Entresto for 2 weeks because the Pharmacy was not able to reach the office for a new prescription refill. No dizziness, lightheadedness, chest pain or palpitations. Does get fatigued easily with activity. No resting DOE. Can walk around the whole grocery store without becoming SOB. He does not weigh himself daily at home. Weights have been increasing in clinic [phone] lbs. No LEE, but patient notes some abdominal  bloating/fullness. He sleeps on 3 pillows which is stable. He is following a low salt diet.    HF Medications: Bisoprolol 10 mg daily Entresto 97/103 mg BID Spironolactone 25 mg daily Dapagliflozin 10 mg daily Bidil 20-37.5 mg 2 tablets TID Furosemide 80 mg daily  Has the patient been experiencing any side effects to the medications prescribed?  no  Does the patient have any problems obtaining medications due to transportation or finances?   No - has Financial risk analyst.  Understanding of regimen: good Understanding of indications: good Potential of compliance: good Patient understands to avoid NSAIDs. Patient understands to avoid decongestants.    Pertinent Lab Values: . Serum creatinine 1.38, BUN 15, Potassium 4.4, Sodium 136  Vital Signs: . Weight: 416.0 lbs (last clinic weight: 413 lbs) . Blood pressure: 132/74  . Heart rate: 82   Assessment: 1. Chronic systolic CHF: Nonischemic cardiomyopathy.  Echo in 6/20 showed EF 15-20% with moderate-severe MR.  RHC/LHC indicated nonischemic cardiomyopathy, cardiac output preserved. Cause of cardiomyopathy is uncertain, he was a heavy drinker in the past.  He has also been found to have HIV, which can cause a cardiomyopathy.  Echo in 10/20 showed EF improved up to 35-40%.  Echo in 2/21 showed EF 35-40% still.   - Volume status assessment difficult w/ body habitus/obesity, but his weight has been progressively increasing over the last few clinic visits: 403 lbs > 413 lbs > 416 lbs. Noted abdominal bloating/fullness. NYHA II-III symptoms.  - Take furosemide 80 mg  AM/40 mg PM for 2 days, then decrease back to 80 mg daily. - Continue bisoprolol 10 mg daily.  - Restart Entresto 97/103 mg BID. Has been out of Entresto for 2 weeks. This is likely contributing to his elevated fluid status.  - Continue spironolactone 25 mg daily.  - Continue Bidil  2 tabs TID.    - Continue Fargixa 10 mg daily  - I think that he is just out of the range  for ICD.    - unable to complete cardiac MRI due to size  2. CAD: LHC (6/20) showed 70% D1 stenosis.  - denies CP   - Continue ASA 81 daily.  - Continue atorvastatin 20 mg daily.   3. Mitral regurgitation: Much improved on most recent echoes.  4. ETOH abuse: Prior ETOH abuse may have contributed to his cardiomyopathy. Still drinking about 1/2 pint liquor/week, has reduced intake over the last 3 months, per pt report 5. OSA: on CPAP, reports nightly compliance  6. Obesity: He is interested in bariatric surgery.  - trying to lose wt - Continue Fargixa given concomitant systolic HF    Plan: 1) Medication changes: Based on clinical presentation, vital signs and recent labs will Take furosemide 80 mg AM/40 mg PM for 2 days, then decrease back to 80 mg daily and restart Entresto 97/103 mg BID. 3) Follow-up: 4 weeks in APP Clinic   Karle Plumber, PharmD, BCPS, BCCP, CPP Heart Failure Clinic Pharmacist [phone]

## 2019-12-07 ENCOUNTER — Other Ambulatory Visit: Payer: Self-pay | Admitting: Family

## 2019-12-07 ENCOUNTER — Telehealth: Payer: Self-pay

## 2019-12-07 DIAGNOSIS — B20 Human immunodeficiency virus [HIV] disease: Secondary | ICD-10-CM

## 2019-12-07 NOTE — Telephone Encounter (Signed)
Called and left a voicemail for patient to call and get some appointments scheduled for provider and labs

## 2019-12-08 ENCOUNTER — Other Ambulatory Visit (HOSPITAL_COMMUNITY): Payer: Self-pay | Admitting: Cardiology

## 2019-12-15 ENCOUNTER — Ambulatory Visit (HOSPITAL_COMMUNITY)
Admission: RE | Admit: 2019-12-15 | Discharge: 2019-12-15 | Disposition: A | Discharge status: Home / Self Care | Payer: BC Managed Care – PPO | Source: Ambulatory Visit | Attending: Cardiology | Admitting: Cardiology

## 2019-12-15 ENCOUNTER — Other Ambulatory Visit: Payer: Self-pay

## 2019-12-15 DIAGNOSIS — I428 Other cardiomyopathies: Secondary | ICD-10-CM | POA: Diagnosis not present

## 2019-12-15 DIAGNOSIS — I340 Nonrheumatic mitral (valve) insufficiency: Secondary | ICD-10-CM | POA: Diagnosis not present

## 2019-12-15 DIAGNOSIS — G4733 Obstructive sleep apnea (adult) (pediatric): Secondary | ICD-10-CM | POA: Diagnosis not present

## 2019-12-15 DIAGNOSIS — F1011 Alcohol abuse, in remission: Secondary | ICD-10-CM | POA: Insufficient documentation

## 2019-12-15 DIAGNOSIS — I5022 Chronic systolic (congestive) heart failure: Secondary | ICD-10-CM | POA: Diagnosis not present

## 2019-12-15 DIAGNOSIS — Z79899 Other long term (current) drug therapy: Secondary | ICD-10-CM | POA: Diagnosis not present

## 2019-12-15 DIAGNOSIS — Z9989 Dependence on other enabling machines and devices: Secondary | ICD-10-CM | POA: Diagnosis not present

## 2019-12-15 DIAGNOSIS — I2510 Atherosclerotic heart disease of native coronary artery without angina pectoris: Secondary | ICD-10-CM | POA: Diagnosis not present

## 2019-12-15 DIAGNOSIS — Z8616 Personal history of COVID-19: Secondary | ICD-10-CM | POA: Diagnosis not present

## 2019-12-15 DIAGNOSIS — Z21 Asymptomatic human immunodeficiency virus [HIV] infection status: Secondary | ICD-10-CM | POA: Diagnosis not present

## 2019-12-15 DIAGNOSIS — E669 Obesity, unspecified: Secondary | ICD-10-CM | POA: Diagnosis not present

## 2019-12-15 DIAGNOSIS — I5042 Chronic combined systolic (congestive) and diastolic (congestive) heart failure: Secondary | ICD-10-CM

## 2019-12-15 DIAGNOSIS — Z7984 Long term (current) use of oral hypoglycemic drugs: Secondary | ICD-10-CM | POA: Insufficient documentation

## 2019-12-15 MED ORDER — ENTRESTO 97-103 MG PO TABS
1.0000 | ORAL_TABLET | Freq: Two times a day (BID) | ORAL | 3 refills | Status: DC
Start: 2019-12-15 — End: 2020-12-29

## 2019-12-15 NOTE — Patient Instructions (Signed)
It was a pleasure seeing you today!  MEDICATIONS: -We are changing your medications today -Restart Entresto 97/103 mg (1 tablet) twice daily. -Increase furosemide (Lasix) to 80 mg (1 tablet) every morning and 40 mg (0.5 tablet) every afternoon for 2 days, then decrease back to 80 mg (1 tablet) daily. -Call if you have questions about your medications.   NEXT APPOINTMENT: Return to clinic in 4 weeks with NP/PA Clinic.  In general, to take care of your heart failure: -Limit your fluid intake to 2 Liters (half-gallon) per day.   -Limit your salt intake to ideally 2-3 grams (2000-3000 mg) per day. -Weigh yourself daily and record, and bring that "weight diary" to your next appointment.  (Weight gain of 2-3 pounds in 1 day typically means fluid weight.) -The medications for your heart are to help your heart and help you live longer.   -Please contact us before stopping any of your heart medications.  Call the clinic at [phone] with questions or to reschedule future appointments.

## 2019-12-23 ENCOUNTER — Other Ambulatory Visit: Payer: BC Managed Care – PPO

## 2019-12-23 ENCOUNTER — Other Ambulatory Visit: Payer: Self-pay

## 2019-12-23 ENCOUNTER — Other Ambulatory Visit (HOSPITAL_COMMUNITY)
Admission: RE | Admit: 2019-12-23 | Discharge: 2019-12-23 | Disposition: A | Discharge status: Home / Self Care | Payer: BC Managed Care – PPO | Source: Ambulatory Visit | Attending: Family | Admitting: Family

## 2019-12-23 DIAGNOSIS — B20 Human immunodeficiency virus [HIV] disease: Secondary | ICD-10-CM | POA: Diagnosis not present

## 2019-12-24 LAB — URINE CYTOLOGY ANCILLARY ONLY
Chlamydia: NEGATIVE
Comment: NEGATIVE
Comment: NORMAL
Neisseria Gonorrhea: NEGATIVE

## 2019-12-24 LAB — T-HELPER CELL (CD4) - (RCID CLINIC ONLY)
CD4 % Helper T Cell: 38 % (ref 33–65)
CD4 T Cell Abs: 1100 /uL (ref 400–1790)

## 2019-12-26 LAB — COMPREHENSIVE METABOLIC PANEL
AG Ratio: 1.5 (calc) (ref 1.0–2.5)
ALT: 42 U/L (ref 9–46)
AST: 29 U/L (ref 10–40)
Albumin: 4.1 g/dL (ref 3.6–5.1)
Alkaline phosphatase (APISO): 69 U/L (ref 36–130)
BUN: 13 mg/dL (ref 7–25)
CO2: 26 mmol/L (ref 20–32)
Calcium: 9.5 mg/dL (ref 8.6–10.3)
Chloride: 103 mmol/L (ref 98–110)
Creat: 1.24 mg/dL (ref 0.60–1.35)
Globulin: 2.7 g/dL (calc) (ref 1.9–3.7)
Glucose, Bld: 104 mg/dL — ABNORMAL HIGH (ref 65–99)
Potassium: 4.4 mmol/L (ref 3.5–5.3)
Sodium: 137 mmol/L (ref 135–146)
Total Bilirubin: 1 mg/dL (ref 0.2–1.2)
Total Protein: 6.8 g/dL (ref 6.1–8.1)

## 2019-12-26 LAB — HIV-1 RNA QUANT-NO REFLEX-BLD
HIV 1 RNA Quant: <20 Copies/mL
HIV-1 RNA Quant, Log: <1.3 Log cps/mL

## 2019-12-26 LAB — RPR: RPR Ser Ql: NONREACTIVE

## 2019-12-29 ENCOUNTER — Other Ambulatory Visit: Payer: Self-pay | Admitting: Family

## 2019-12-29 DIAGNOSIS — B20 Human immunodeficiency virus [HIV] disease: Secondary | ICD-10-CM

## 2020-01-06 ENCOUNTER — Encounter: Payer: BC Managed Care – PPO | Admitting: Family

## 2020-01-18 ENCOUNTER — Encounter (HOSPITAL_COMMUNITY): Payer: BC Managed Care – PPO

## 2020-02-01 ENCOUNTER — Other Ambulatory Visit: Payer: Self-pay

## 2020-02-01 ENCOUNTER — Encounter: Payer: Self-pay | Admitting: Family

## 2020-02-01 ENCOUNTER — Ambulatory Visit (INDEPENDENT_AMBULATORY_CARE_PROVIDER_SITE_OTHER): Payer: BC Managed Care – PPO | Admitting: Family

## 2020-02-01 VITALS — BP 147/82 | HR 93 | Temp 98.0°F | Ht 70.0 in | Wt >= 6400 oz

## 2020-02-01 DIAGNOSIS — Z0000 Encounter for general adult medical examination without abnormal findings: Secondary | ICD-10-CM | POA: Diagnosis not present

## 2020-02-01 DIAGNOSIS — Z113 Encounter for screening for infections with a predominantly sexual mode of transmission: Secondary | ICD-10-CM

## 2020-02-01 DIAGNOSIS — B20 Human immunodeficiency virus [HIV] disease: Secondary | ICD-10-CM | POA: Diagnosis not present

## 2020-02-01 MED ORDER — BIKTARVY 50-200-25 MG PO TABS: 1.0000 | ORAL_TABLET | Freq: Every day | ORAL | 4 refills | Status: DC

## 2020-02-01 NOTE — Progress Notes (Signed)
Subjective:    Patient ID: Todd Griffith, male    DOB: [birth year], 29 y.o.   MRN: [id number]  Chief Complaint  Patient presents with  . Follow-up    given condoms      HPI:  Todd Griffith is a 29 y.o. male with HIV disease and dilated cardiomyopathy who was last seen in the office on 02/23/2019 with good adherence and tolerance to his ART regimen of Biktarvy.  Viral load at the time was undetectable with CD4 count of 1272.  Most recent blood work completed on 12/23/2019 with a viral load that remains undetectable and CD4 count of 1100.  RPR was nonreactive for syphilis and gonorrhea and Chlamydia testing negative.  Kidney function, liver function, electrolytes within normal ranges.  Todd Griffith continues to take his Biktarvy daily as prescribed with no adverse side effects or missed doses since his last office visit.  Overall feeling well today with no new concerns/complaints.  His daughter was recently admitted to the hospital for pneumonia. Denies fevers, chills, night sweats, headaches, changes in vision, neck pain/stiffness, nausea, diarrhea, vomiting, lesions or rashes.  Todd Griffith Griffith no problems obtaining his medication from pharmacy and remains covered through [facility].  Denies any feelings of being down, depressed, or hopeless recently.  No recreational or illicit drug use, tobacco use, and alcohol use is occasional.  Condoms provided per request.  Declines influenza vaccine.  He is due for routine dental care which she will complete independently.  No Known Allergies    Outpatient Medications Prior to Visit  Medication Sig Dispense Refill  . albuterol (VENTOLIN HFA) 108 (90 Base) MCG/ACT inhaler Inhale 1-2 puffs into the lungs every 6 (six) hours as needed for wheezing or shortness of breath. 6.7 g 2  . aspirin 81 MG chewable tablet Chew 1 tablet (81 mg total) by mouth daily. 30 tablet 3  . atorvastatin (LIPITOR) 20 MG tablet TAKE 1 TABLET BY MOUTH  EVERY DAY 90 tablet 3  . bisoprolol (ZEBETA) 10 MG tablet Take 1 tablet (10 mg total) by mouth daily. 30 tablet 11  . dapagliflozin propanediol (FARXIGA) 10 MG TABS tablet Take 1 tablet (10 mg total) by mouth daily before breakfast. 30 tablet 3  . fluticasone (FLONASE) 50 MCG/ACT nasal spray Place 2 sprays into both nostrils daily. 16 g 0  . furosemide (LASIX) 80 MG tablet Take 1 tablet (80 mg total) by mouth daily. Take one TAB every morning. 30 tablet 3  . isosorbide-hydrALAZINE (BIDIL) 20-37.5 MG tablet Take 2 tablets by mouth 3 (three) times daily. 180 tablet 5  . sacubitril-valsartan (ENTRESTO) 97-103 MG Take 1 tablet by mouth 2 (two) times daily. 180 tablet 3  . spironolactone (ALDACTONE) 25 MG tablet TAKE 1 TABLET BY MOUTH EVERY DAY 90 tablet 1  . BIKTARVY 50-200-25 MG TABS tablet TAKE 1 TABLET BY MOUTH EVERY DAY 30 tablet 4  . ID NOW COVID-19 KIT See admin instructions.     No facility-administered medications prior to visit.     Past Medical History:  Diagnosis Date  . Asthma   . Bronchitis   . CHF (congestive heart failure) ([place])   . HIV (human immunodeficiency virus infection) ([place])   . Obstructive sleep apnea      Past Surgical History:  Procedure Laterality Date  . RIGHT/LEFT HEART CATH AND CORONARY ANGIOGRAPHY N/A 09/21/2018   Procedure: RIGHT/LEFT HEART CATH AND CORONARY ANGIOGRAPHY;  Surgeon: Leonie Man, MD;  Location: [place] [facility];  Service: Cardiovascular;  Laterality: N/A;       Review of Systems  Constitutional: Negative for appetite change, chills, fatigue, fever and unexpected weight change.  Eyes: Negative for visual disturbance.  Respiratory: Negative for cough, chest tightness, shortness of breath and wheezing.   Cardiovascular: Negative for chest pain and leg swelling.  Gastrointestinal: Negative for abdominal pain, constipation, diarrhea, nausea and vomiting.  Genitourinary: Negative for dysuria, flank pain, frequency, genital sores,  hematuria and urgency.  Skin: Negative for rash.  Allergic/Immunologic: Negative for immunocompromised state.  Neurological: Negative for dizziness and headaches.      Objective:    BP (!) 147/82   Pulse 93   Temp 98 F (36.7 C) (Oral)   Ht _0  (1.778 m)   Wt (!) 420 lb (190.5 kg)   SpO2 97%   BMI 60.26 kg/m  Nursing note and vital signs reviewed.  Physical Exam Constitutional:      General: He is not in acute distress.    Appearance: He is well-developed. He is obese.  Eyes:     Conjunctiva/sclera: Conjunctivae normal.  Cardiovascular:     Rate and Rhythm: Normal rate and regular rhythm.     Heart sounds: Normal heart sounds. No murmur heard.  No friction rub. No gallop.   Pulmonary:     Effort: Pulmonary effort is normal. No respiratory distress.     Breath sounds: Normal breath sounds. No wheezing or rales.  Chest:     Chest wall: No tenderness.  Abdominal:     General: Bowel sounds are normal.     Palpations: Abdomen is soft.     Tenderness: There is no abdominal tenderness.  Musculoskeletal:     Cervical back: Neck supple.  Lymphadenopathy:     Cervical: No cervical adenopathy.  Skin:    General: Skin is warm and dry.     Findings: No rash.  Neurological:     Mental Status: He is alert and oriented to person, place, and time.  Psychiatric:        Behavior: Behavior normal.        Thought Content: Thought content normal.        Judgment: Judgment normal.      Depression screen [facility] 2/9 02/01/2020 10/21/2019 02/23/2019 11/09/2018 11/05/2018  Decreased Interest 0 1 0 0 1  Down, Depressed, Hopeless 1 1 0 0 0  PHQ - 2 Score 1 2 0 0 1  Altered sleeping - 3 - - -  Tired, decreased energy - 1 - - -  Change in appetite - 0 - - -  Feeling bad or failure about yourself  - 2 - - -  Trouble concentrating - 0 - - -  Moving slowly or fidgety/restless - 1 - - -  Suicidal thoughts - 0 - - -  PHQ-9 Score - 9 - - -  Difficult doing work/chores - Somewhat difficult -  - -       Assessment & Plan:    Patient Active Problem List   Diagnosis Date Noted  . Healthcare maintenance 11/09/2018  . HIV disease ([place]) 09/22/2018  . Dilated cardiomyopathy ([place]) 09/21/2018  . Acute combined systolic and diastolic heart failure ([place])   . Morbid obesity ([place])   . Volume overload 09/17/2018  . Peripheral edema   . Orthopnea   . Cardiomegaly   . Uncomplicated asthma      Problem List Items Addressed This Visit      Other   HIV disease (  [facility])    Todd Griffith continues to have well-controlled HIV disease with good adherence and tolerance to his ART regimen of Biktarvy.  No signs/symptoms of opportunistic infection or progressive HIV disease.  Reviewed lab work and discussed plan of care.  Continue current dose of Biktarvy.  Plan for follow-up in 4 months or sooner if needed with lab work 1 to 2 weeks prior to appointment or on same day.      Relevant Medications   bictegravir-emtricitabine-tenofovir AF (BIKTARVY) 50-200-25 MG TABS tablet   Healthcare maintenance     Discussed importance of safe sexual practice to reduce risk of STI.  Condoms provided.  Due for routine dental care which she will complete independently with commercial insurance.  Declines influenza vaccine           I have changed Todd Griffith's Biktarvy. I am also having him maintain his aspirin, furosemide, albuterol, spironolactone, isosorbide-hydrALAZINE, fluticasone, atorvastatin, bisoprolol, ID Now COVID-19, dapagliflozin propanediol, and Entresto.   Meds ordered this encounter  Medications  . bictegravir-emtricitabine-tenofovir AF (BIKTARVY) 50-200-25 MG TABS tablet    Sig: Take 1 tablet by mouth daily.    Dispense:  30 tablet    Refill:  4    Order Specific Question:   Supervising Provider    Answer:   Carlyle Basques [[id number]]     Follow-up: Return in about 4 months (around 05/31/2020), or if symptoms worsen or fail to improve.   Terri Piedra, MSN, FNP-C Nurse  Practitioner [facility]ious [facility] [place] number: [phone]

## 2020-02-01 NOTE — Patient Instructions (Addendum)
Nice to see you.  We will continue your Biktarvy daily as prescribed.  Refills will be sent to the pharmacy.  Plan for follow up in 4 months or sooner if needed.   Have a great day and stay safe!iu

## 2020-02-02 ENCOUNTER — Encounter: Payer: Self-pay | Admitting: Family

## 2020-02-02 NOTE — Assessment & Plan Note (Signed)
Mr. Todd Griffith continues to have well-controlled HIV disease with good adherence and tolerance to his ART regimen of Biktarvy.  No signs/symptoms of opportunistic infection or progressive HIV disease.  Reviewed lab work and discussed plan of care.  Continue current dose of Biktarvy.  Plan for follow-up in 4 months or sooner if needed with lab work 1 to 2 weeks prior to appointment or on same day.

## 2020-02-02 NOTE — Assessment & Plan Note (Signed)
   Discussed importance of safe sexual practice to reduce risk of STI.  Condoms provided.  Due for routine dental care which she will complete independently with commercial insurance.  Declines influenza vaccine

## 2020-02-02 NOTE — Addendum Note (Signed)
Addended by: Jeanine Luz D on: 02/02/2020 12:49 PM   Modules accepted: Orders

## 2020-02-07 ENCOUNTER — Ambulatory Visit (HOSPITAL_COMMUNITY)
Admission: RE | Admit: 2020-02-07 | Discharge: 2020-02-07 | Disposition: A | Discharge status: Home / Self Care | Payer: BC Managed Care – PPO | Source: Ambulatory Visit | Attending: Cardiology | Admitting: Cardiology

## 2020-02-07 ENCOUNTER — Other Ambulatory Visit: Payer: Self-pay

## 2020-02-07 ENCOUNTER — Encounter (HOSPITAL_COMMUNITY): Payer: Self-pay

## 2020-02-07 VITALS — BP 145/95 | HR 88 | Wt >= 6400 oz

## 2020-02-07 DIAGNOSIS — Z7982 Long term (current) use of aspirin: Secondary | ICD-10-CM | POA: Diagnosis not present

## 2020-02-07 DIAGNOSIS — Z79899 Other long term (current) drug therapy: Secondary | ICD-10-CM | POA: Insufficient documentation

## 2020-02-07 DIAGNOSIS — F1010 Alcohol abuse, uncomplicated: Secondary | ICD-10-CM | POA: Insufficient documentation

## 2020-02-07 DIAGNOSIS — I428 Other cardiomyopathies: Secondary | ICD-10-CM | POA: Diagnosis not present

## 2020-02-07 DIAGNOSIS — G4733 Obstructive sleep apnea (adult) (pediatric): Secondary | ICD-10-CM | POA: Diagnosis not present

## 2020-02-07 DIAGNOSIS — I340 Nonrheumatic mitral (valve) insufficiency: Secondary | ICD-10-CM | POA: Diagnosis not present

## 2020-02-07 DIAGNOSIS — I5042 Chronic combined systolic (congestive) and diastolic (congestive) heart failure: Secondary | ICD-10-CM | POA: Diagnosis not present

## 2020-02-07 DIAGNOSIS — I110 Hypertensive heart disease with heart failure: Secondary | ICD-10-CM | POA: Diagnosis not present

## 2020-02-07 DIAGNOSIS — Z7984 Long term (current) use of oral hypoglycemic drugs: Secondary | ICD-10-CM | POA: Diagnosis not present

## 2020-02-07 DIAGNOSIS — I2510 Atherosclerotic heart disease of native coronary artery without angina pectoris: Secondary | ICD-10-CM | POA: Insufficient documentation

## 2020-02-07 DIAGNOSIS — Z6841 Body Mass Index (BMI) 40.0 and over, adult: Secondary | ICD-10-CM | POA: Diagnosis not present

## 2020-02-07 DIAGNOSIS — Z9114 Patient's other noncompliance with medication regimen: Secondary | ICD-10-CM | POA: Diagnosis not present

## 2020-02-07 DIAGNOSIS — Z21 Asymptomatic human immunodeficiency virus [HIV] infection status: Secondary | ICD-10-CM | POA: Diagnosis not present

## 2020-02-07 DIAGNOSIS — Z8249 Family history of ischemic heart disease and other diseases of the circulatory system: Secondary | ICD-10-CM | POA: Diagnosis not present

## 2020-02-07 DIAGNOSIS — I5022 Chronic systolic (congestive) heart failure: Secondary | ICD-10-CM | POA: Insufficient documentation

## 2020-02-07 LAB — BASIC METABOLIC PANEL
Anion gap: 7 (ref 5–15)
BUN: 14 mg/dL (ref 6–20)
CO2: 24 mmol/L (ref 22–32)
Calcium: 9.1 mg/dL (ref 8.9–10.3)
Chloride: 108 mmol/L (ref 98–111)
Creatinine, Ser: 1.21 mg/dL (ref 0.61–1.24)
GFR, Estimated: >60 mL/min (ref >60)
Glucose, Bld: 132 mg/dL — ABNORMAL HIGH (ref 70–99)
Potassium: 4 mmol/L (ref 3.5–5.1)
Sodium: 139 mmol/L (ref 135–145)

## 2020-02-07 MED ORDER — FUROSEMIDE 80 MG PO TABS: 80.0000 mg | ORAL_TABLET | Freq: Every day | ORAL | 6 refills | Status: DC

## 2020-02-07 MED ORDER — ASPIRIN 81 MG PO CHEW
81.0000 mg | CHEWABLE_TABLET | Freq: Every day | ORAL | 3 refills | Status: DC
Start: 2020-02-07 — End: 2020-06-08

## 2020-02-07 MED ORDER — SPIRONOLACTONE 25 MG PO TABS
25.0000 mg | ORAL_TABLET | Freq: Every day | ORAL | 1 refills | Status: DC
Start: 2020-02-07 — End: 2020-09-22

## 2020-02-07 NOTE — Patient Instructions (Signed)
INCREASE Lasix to 80 mg in the AM and 40 mg in the PM, then resume normal dose of 80 mg daily  Labs today We will only contact you if something comes back abnormal or we need to make some changes. Otherwise no news is good news!  Your physician recommends that you schedule a follow-up appointment in: 8 weeks  in the Advanced Practitioners (PA/NP) Clinic   If you have any questions or concerns before your next appointment please send [place] a message through [place] or call our office at [phone].    TO LEAVE A MESSAGE FOR THE NURSE SELECT OPTION 2, PLEASE LEAVE A MESSAGE INCLUDING: . YOUR NAME . DATE OF BIRTH . CALL BACK NUMBER . REASON FOR CALL**this is important as we prioritize the call backs  YOU WILL RECEIVE A CALL BACK THE SAME DAY AS LONG AS YOU CALL BEFORE 4:00 PM

## 2020-02-07 NOTE — Progress Notes (Signed)
PCP: Massie Maroon, FNP Cardiology: Dr. Shirlee Latch  29 y.o. with history of asthma, OSA, HIV+, and nonischemic cardiomyopathy returns for followup of CHF.  He was admitted in 6/20 with dyspnea and found to have CHF.  Echo showed EF 15-20% with diffuse hypokinesis, moderate-severe MR. LHC/RHC showed mild coronary disease, preserved cardiac output.  He was also diagnosed with HIV and started on Biktarvy. Also w/ OSA on CPAP and heavy ETOH use (drinks ~1 pint liquor/week).   Echo in 10/20 showed EF up to 35-40%, MR is only trivial.  Echo in 2/21 showed EF 35-40% with normal RV. No cMRI due to size.   He was seen in the ED 6/21 for cough, nasal congestion and pleuritic CP. Had + COVID test w/ CXR c/w PNA but did not require hospitalization. He quarantined at home and treated w/ outpatient abx. He has recovered w/o any residual symptoms.   Seen recently in [facility] 8/21 and was doing ok w/ ADLs, but mildly SOB w/ mild-moderate level activities. Denied resting dyspnea. His wt was up 8 lb from previous visit, from 403>>411 lb. Farxiga 10 mg daily was added to regimen.   He presents back to clinic today for f/u. Wt is up 10 lb. He reports he ran out of both his lasix and spiro over a week ago and has not been able to get refills. Denies any significant dyspnea. No CP. BP mildly elevated at 145/95 but he has not yet taken am meds. Does not weigh regularly. Reports full compliance w/ CPAP nightly. Tolerating farxiga ok w/o side effects. No GU issues.    Labs (9/20): K 4.2, creatinine 1.48 Labs (10/20): LDL 73, HDL 26 Labs (11/20): K 4.1, creatinine 1.39 Labs (12/20): K 4.2, creatinine 1.43 Labs (5/21):  K 4.2, creatinine 1.36   Labs (8/21): K 4.4, creatinine 1.38, Hgb A1c 4.8, LDL 54 mg/dL   PMH: 1. Chronic systolic CHF: Nonischemic cardiomyopathy.   - LHC/RHC (6/20): mean RA 4, mean PCWP 21, CI 3; 70% D1 stenosis.  - Echo (6/20): EF 15-20%, normal RV, moderate-severe MR.  - Echo (10/20): EF 35-40% with  diffuse hypokinesis, normal RV, trivial MR.   - Echo (2/21): EF 35-40%, diffuse hypokinesis, normal RV, normal IVC 2. HIV+: Followed by ID.  3. H/o ETOH abuse.  4. Asthma: Since childhood.  5. HTN 6. Morbid obesity.  7. OSA: Uses CPAP.  8. CAD: LHC in 6/20 with 70% D1 stenosis.   FH: Father, grandf ather with CAD, no cardiomyopathy.   SH: Lives in [place] with girlfriend and child, works for [employer] [place].  Nonsmoker.  Prior heavy ETOH, now drinks some on weekend but much less. Nonsmoker, no drugs.   ROS: All systems reviewed and negative except as per HPI.   Current Outpatient Medications  Medication Sig Dispense Refill  . albuterol (VENTOLIN HFA) 108 (90 Base) MCG/ACT inhaler Inhale 1-2 puffs into the lungs every 6 (six) hours as needed for wheezing or shortness of breath. 6.7 g 2  . aspirin 81 MG chewable tablet Chew 1 tablet (81 mg total) by mouth daily. 30 tablet 3  . atorvastatin (LIPITOR) 20 MG tablet TAKE 1 TABLET BY MOUTH EVERY DAY 90 tablet 3  . bictegravir-emtricitabine-tenofovir AF (BIKTARVY) 50-200-25 MG TABS tablet Take 1 tablet by mouth daily. 30 tablet 4  . bisoprolol (ZEBETA) 10 MG tablet Take 1 tablet (10 mg total) by mouth daily. 30 tablet 11  . dapagliflozin propanediol (FARXIGA) 10 MG TABS tablet Take 1 tablet (  10 mg total) by mouth daily before breakfast. 30 tablet 3  . fluticasone (FLONASE) 50 MCG/ACT nasal spray Place 2 sprays into both nostrils daily. 16 g 0  . furosemide (LASIX) 80 MG tablet Take 1 tablet (80 mg total) by mouth daily. Take one TAB every morning. 30 tablet 3  . isosorbide-hydrALAZINE (BIDIL) 20-37.5 MG tablet Take 2 tablets by mouth 3 (three) times daily. 180 tablet 5  . sacubitril-valsartan (ENTRESTO) 97-103 MG Take 1 tablet by mouth 2 (two) times daily. 180 tablet 3  . spironolactone (ALDACTONE) 25 MG tablet TAKE 1 TABLET BY MOUTH EVERY DAY 90 tablet 1   No current facility-administered medications for this encounter.   BP (!) 145/95    Pulse 88   Wt (!) 191.3 kg (421 lb 12.8 oz)   SpO2 96%   BMI 60.52 kg/m   PHYSICAL EXAM: General:  Well appearing, super morbidly obese young AAM. No respiratory difficulty HEENT: normal Neck: thick neck, JVD not well visualized. Carotids 2+ bilat; no bruits. No lymphadenopathy or thyromegaly appreciated. Cor: PMI nondisplaced. Regular rate & rhythm. No rubs, gallops or murmurs. Lungs: clear Abdomen: soft, nontender, nondistended. No hepatosplenomegaly. No bruits or masses. Good bowel sounds. Extremities: no cyanosis, clubbing, rash, trace bilateral pretibial edema Neuro: alert & oriented x 3, cranial nerves grossly intact. moves all 4 extremities w/o difficulty. Affect pleasant.    Assessment/Plan: 1. Chronic systolic CHF: Nonischemic cardiomyopathy.  Echo in 6/20 showed EF 15-20% with moderate-severe MR.  RHC/LHC indicated nonischemic cardiomyopathy, cardiac output preserved. Cause of cardiomyopathy is uncertain, he was a heavy drinker in the past.  He has also been found to have HIV, which can cause a cardiomyopathy.  Echo in 10/20 showed EF improved up to 35-40%.  Echo in 2/21 showed EF 35-40% still.  Chronically NYHA II-III symptoms, confounded by super morbid obesity and deconditioning. He appears mildly fluid overloaded on exam and Wt up 10 lb from previous visit, in the setting of poor med compliance (ran out of spiro and lasix over a week ago).  - refill diuretics and plan to increase lasix to 80 mg qam/40 mg qpm x 2 days then return to 80 mg once daily  - Continue bisoprolol 10 mg daily.  - Continue Entresto 97/103 bid.  - Continue spironolactone 25 mg daily.  - Continue Bidil  2 tabs tid.   - Continue Fargixa 10 mg daily  - Check BMP today  - I think that he is just out of the range for ICD.    - unable to complete cardiac MRI due to size  2. CAD: LHC (6/20) showed 70% D1 stenosis.  - stable w/ CP - Continue ASA 81 daily.  - Continue atorvastatin 20 mg daily.  - Lipid  panel checked last visit, 8/21 and LDL was at goal at 54 mg/dL  3. Mitral regurgitation: Much improved on most recent echoes.  4. ETOH abuse: Prior ETOH abuse may have contributed to his cardiomyopathy. Reports he has significanly reduced intake over the last 3 months. 5. OSA: on CPAP, reports nightly compliance. Encouraged to continue 6. Obesity: Body mass index is 60.52 kg/m. - He is interested in bariatric surgery. He is being followed by wt loss clinic    F/u w/ APP in 6 weeks.   Robbie Lis, PA-C  02/07/2020

## 2020-02-15 ENCOUNTER — Ambulatory Visit: Payer: Self-pay | Admitting: Family Medicine

## 2020-02-25 ENCOUNTER — Other Ambulatory Visit (HOSPITAL_COMMUNITY): Payer: Self-pay | Admitting: Cardiology

## 2020-03-28 ENCOUNTER — Other Ambulatory Visit (HOSPITAL_COMMUNITY): Payer: Self-pay | Admitting: Cardiology

## 2020-03-30 ENCOUNTER — Encounter (HOSPITAL_COMMUNITY): Payer: Self-pay

## 2020-04-03 ENCOUNTER — Other Ambulatory Visit (HOSPITAL_COMMUNITY): Payer: Self-pay | Admitting: *Deleted

## 2020-04-03 MED ORDER — DAPAGLIFLOZIN PROPANEDIOL 10 MG PO TABS: ORAL_TABLET | ORAL | 3 refills | Status: DC

## 2020-04-04 ENCOUNTER — Encounter (HOSPITAL_COMMUNITY): Payer: Self-pay

## 2020-04-04 ENCOUNTER — Ambulatory Visit (HOSPITAL_COMMUNITY)
Admission: RE | Admit: 2020-04-04 | Discharge: 2020-04-04 | Disposition: A | Discharge status: Home / Self Care | Payer: BC Managed Care – PPO | Source: Ambulatory Visit | Attending: Cardiology | Admitting: Cardiology

## 2020-04-04 ENCOUNTER — Other Ambulatory Visit: Payer: Self-pay

## 2020-04-04 VITALS — BP 122/80 | HR 89 | Wt >= 6400 oz

## 2020-04-04 DIAGNOSIS — Z6841 Body Mass Index (BMI) 40.0 and over, adult: Secondary | ICD-10-CM | POA: Diagnosis not present

## 2020-04-04 DIAGNOSIS — I5022 Chronic systolic (congestive) heart failure: Secondary | ICD-10-CM | POA: Diagnosis not present

## 2020-04-04 DIAGNOSIS — Z8249 Family history of ischemic heart disease and other diseases of the circulatory system: Secondary | ICD-10-CM | POA: Diagnosis not present

## 2020-04-04 DIAGNOSIS — I5042 Chronic combined systolic (congestive) and diastolic (congestive) heart failure: Secondary | ICD-10-CM

## 2020-04-04 DIAGNOSIS — I428 Other cardiomyopathies: Secondary | ICD-10-CM | POA: Diagnosis not present

## 2020-04-04 DIAGNOSIS — I2510 Atherosclerotic heart disease of native coronary artery without angina pectoris: Secondary | ICD-10-CM | POA: Insufficient documentation

## 2020-04-04 DIAGNOSIS — F1011 Alcohol abuse, in remission: Secondary | ICD-10-CM | POA: Diagnosis not present

## 2020-04-04 DIAGNOSIS — G4733 Obstructive sleep apnea (adult) (pediatric): Secondary | ICD-10-CM | POA: Diagnosis not present

## 2020-04-04 DIAGNOSIS — Z7982 Long term (current) use of aspirin: Secondary | ICD-10-CM | POA: Diagnosis not present

## 2020-04-04 DIAGNOSIS — Z21 Asymptomatic human immunodeficiency virus [HIV] infection status: Secondary | ICD-10-CM | POA: Diagnosis not present

## 2020-04-04 DIAGNOSIS — R5381 Other malaise: Secondary | ICD-10-CM | POA: Diagnosis not present

## 2020-04-04 DIAGNOSIS — Z79899 Other long term (current) drug therapy: Secondary | ICD-10-CM | POA: Insufficient documentation

## 2020-04-04 DIAGNOSIS — Z7984 Long term (current) use of oral hypoglycemic drugs: Secondary | ICD-10-CM | POA: Insufficient documentation

## 2020-04-04 DIAGNOSIS — I340 Nonrheumatic mitral (valve) insufficiency: Secondary | ICD-10-CM | POA: Insufficient documentation

## 2020-04-04 LAB — COMPREHENSIVE METABOLIC PANEL
ALT: 35 U/L (ref 0–44)
AST: 30 U/L (ref 15–41)
Albumin: 3.5 g/dL (ref 3.5–5.0)
Alkaline Phosphatase: 75 U/L (ref 38–126)
Anion gap: 10 (ref 5–15)
BUN: 13 mg/dL (ref 6–20)
CO2: 23 mmol/L (ref 22–32)
Calcium: 9.1 mg/dL (ref 8.9–10.3)
Chloride: 102 mmol/L (ref 98–111)
Creatinine, Ser: 1.34 mg/dL — ABNORMAL HIGH (ref 0.61–1.24)
GFR, Estimated: >60 mL/min (ref >60)
Glucose, Bld: 94 mg/dL (ref 70–99)
Potassium: 3.4 mmol/L — ABNORMAL LOW (ref 3.5–5.1)
Sodium: 135 mmol/L (ref 135–145)
Total Bilirubin: 1.4 mg/dL — ABNORMAL HIGH (ref 0.3–1.2)
Total Protein: 6.8 g/dL (ref 6.5–8.1)

## 2020-04-04 LAB — LIPID PANEL
Cholesterol: 115 mg/dL (ref 0–200)
HDL: 25 mg/dL — ABNORMAL LOW (ref >40)
LDL Cholesterol: 58 mg/dL (ref 0–99)
Total CHOL/HDL Ratio: 4.6 RATIO
Triglycerides: 161 mg/dL — ABNORMAL HIGH (ref <150)
VLDL: 32 mg/dL (ref 0–40)

## 2020-04-04 MED ORDER — FUROSEMIDE 80 MG PO TABS: ORAL_TABLET | ORAL | 6 refills | Status: DC

## 2020-04-04 NOTE — Progress Notes (Signed)
PCP: Massie Maroon, FNP Cardiology: Dr. Shirlee Latch  30 y.o. with history of asthma, OSA, HIV+, and nonischemic cardiomyopathy returns for followup of CHF.  He was admitted in 6/20 with dyspnea and found to have CHF.  Echo showed EF 15-20% with diffuse hypokinesis, moderate-severe MR. LHC/RHC showed mild coronary disease, preserved cardiac output.  He was also diagnosed with HIV and started on Biktarvy. Also w/ OSA on CPAP and heavy ETOH use (drinks ~1 pint liquor/week).   Echo in 10/20 showed EF up to 35-40%, MR is only trivial.  Echo in 2/21 showed EF 35-40% with normal RV. No cMRI due to size.   He was seen in the ED 6/21 for cough, nasal congestion and pleuritic CP. Had + COVID test w/ CXR c/w PNA but did not require hospitalization. He quarantined at home and treated w/ outpatient abx. He has recovered w/o any residual symptoms.   Seen recently in [facility] 8/21 and was doing ok w/ ADLs, but mildly SOB w/ mild-moderate level activities. Denied resting dyspnea. His wt was up 8 lb from previous visit, from 403>>411 lb. Farxiga 10 mg daily was added to regimen.   He returns to clinic today for f/u. Doing well. Reports full med compliance and tolerating w/o side effects. Office wts however have gradually been trending up. He is not checking wt daily at home. Denies significant dyspnea. Recently joined a gym and trying to lose wt. Exercising on elliptical and treadmill w/o significant limitation. No CP. BP 122/80. Tries to limit salt intake.    Labs (9/20): K 4.2, creatinine 1.48 Labs (10/20): LDL 73, HDL 26 Labs (11/20): K 4.1, creatinine 1.39 Labs (12/20): K 4.2, creatinine 1.43 Labs (5/21):  K 4.2, creatinine 1.36   Labs (8/21): K 4.4, creatinine 1.38, Hgb A1c 4.8, LDL 54 mg/dL  Labs (78/67): Creatinine 1.21, K 4.0   PMH: 1. Chronic systolic CHF: Nonischemic cardiomyopathy.   - LHC/RHC (6/20): mean RA 4, mean PCWP 21, CI 3; 70% D1 stenosis.  - Echo (6/20): EF 15-20%, normal RV, moderate-severe  MR.  - Echo (10/20): EF 35-40% with diffuse hypokinesis, normal RV, trivial MR.   - Echo (2/21): EF 35-40%, diffuse hypokinesis, normal RV, normal IVC 2. HIV+: Followed by ID.  3. H/o ETOH abuse.  4. Asthma: Since childhood.  5. HTN 6. Morbid obesity.  7. OSA: Uses CPAP.  8. CAD: LHC in 6/20 with 70% D1 stenosis.   FH: Father, grandf ather with CAD, no cardiomyopathy.   SH: Lives in [place] with girlfriend and child, works for [employer] [place].  Nonsmoker.  Prior heavy ETOH, now drinks some on weekend but much less. Nonsmoker, no drugs.   ROS: All systems reviewed and negative except as per HPI.   Current Outpatient Medications  Medication Sig Dispense Refill  . albuterol (VENTOLIN HFA) 108 (90 Base) MCG/ACT inhaler Inhale 1-2 puffs into the lungs every 6 (six) hours as needed for wheezing or shortness of breath. 6.7 g 2  . aspirin 81 MG chewable tablet Chew 1 tablet (81 mg total) by mouth daily. 30 tablet 3  . atorvastatin (LIPITOR) 20 MG tablet TAKE 1 TABLET BY MOUTH EVERY DAY 90 tablet 3  . bictegravir-emtricitabine-tenofovir AF (BIKTARVY) 50-200-25 MG TABS tablet Take 1 tablet by mouth daily. 30 tablet 4  . bisoprolol (ZEBETA) 10 MG tablet Take 1 tablet (10 mg total) by mouth daily. 30 tablet 11  . dapagliflozin propanediol (FARXIGA) 10 MG TABS tablet TAKE 1 TABLET (10 MG TOTAL) BY  MOUTH DAILY BEFORE BREAKFAST. 90 tablet 3  . fluticasone (FLONASE) 50 MCG/ACT nasal spray Place 2 sprays into both nostrils daily. 16 g 0  . furosemide (LASIX) 80 MG tablet Take 1 tablet (80 mg total) by mouth daily. . 30 tablet 6  . isosorbide-hydrALAZINE (BIDIL) 20-37.5 MG tablet Take 2 tablets by mouth 3 (three) times daily. 180 tablet 5  . sacubitril-valsartan (ENTRESTO) 97-103 MG Take 1 tablet by mouth 2 (two) times daily. 180 tablet 3  . spironolactone (ALDACTONE) 25 MG tablet Take 1 tablet (25 mg total) by mouth daily. 90 tablet 1   No current facility-administered medications for this  encounter.   BP 122/80   Pulse 89   Wt (!) 194.7 kg (429 lb 3.2 oz)   SpO2 96%   BMI 61.58 kg/m   PHYSICAL EXAM: General:  Well appearing. obese No respiratory difficulty HEENT: normal Neck: supple. Thick neck JVD assessment difficult. Carotids 2+ bilat; no bruits. No lymphadenopathy or thyromegaly appreciated. Cor: PMI nondisplaced. Regular rate & rhythm. No rubs, gallops or murmurs. Lungs: clear Abdomen: soft, nontender, nondistended. No hepatosplenomegaly. No bruits or masses. Good bowel sounds. Extremities: no cyanosis, clubbing, rash, trace to 1 + bilateral LEE edema Neuro: alert & oriented x 3, cranial nerves grossly intact. moves all 4 extremities w/o difficulty. Affect pleasant.    Assessment/Plan: 1. Chronic systolic CHF: Nonischemic cardiomyopathy.  Echo in 6/20 showed EF 15-20% with moderate-severe MR.  RHC/LHC indicated nonischemic cardiomyopathy, cardiac output preserved. Cause of cardiomyopathy is uncertain, he was a heavy drinker in the past.  He has also been found to have HIV, which can cause a cardiomyopathy.  Echo in 10/20 showed EF improved up to 35-40%.  Echo in 2/21 showed EF 35-40% still.  Chronically NYHA II-III symptoms, confounded by super morbid obesity and deconditioning. He appears mildly fluid overloaded on exam and wt up from previous clinic visit.  - Increase lasix to 80 qam/ 40 qpm  - Continue bisoprolol 10 mg daily.  - Continue Entresto 97/103 bid.  - Continue spironolactone 25 mg daily.  - Continue Bidil  2 tabs tid.   - Continue Fargixa 10 mg daily  - Check BMP today and again in 7 days - I think that he is just out of the range for ICD.    - unable to complete cardiac MRI due to size  - discussed importance of daily wts and low sodium diet. He has a scale at home  2. CAD: LHC (6/20) showed 70% D1 stenosis.  - stable. Denies CP  - Continue ASA 81 daily.  - Continue atorvastatin 20 mg daily.  - Check Lipid panel today. LDL goal < 70   3. Mitral  regurgitation: Much improved on most recent echoes.  4. ETOH abuse: Prior ETOH abuse may have contributed to his cardiomyopathy. Reports he has significanly reduced intake over the last 6 months. 5. OSA: on CPAP, reports nightly compliance. Encouraged to continue regular use 6. Obesity: Body mass index is 61.58 kg/m. - He is interested in bariatric surgery. He is being followed by wt loss clinic  - trying to lose wt prior to surgery. Recently joined gym.    F/u w/ Dr. Shirlee Latch in 3 months   Robbie Lis, PA-C  04/04/2020

## 2020-04-04 NOTE — Patient Instructions (Signed)
INCREASE Lasix to 80 mg, in the AM and 40 mg in the PM  Labs today We will only contact you if something comes back abnormal or we need to make some changes. Otherwise no news is good news!  Labs needed in 7-10 days  Your physician recommends that you schedule a follow-up appointment in: 3-4 months with DR [facility]  If you have any questions or concerns before your next appointment please send [place] a message through [place] or call our office at [phone].    TO LEAVE A MESSAGE FOR THE NURSE SELECT OPTION 2, PLEASE LEAVE A MESSAGE INCLUDING: . YOUR NAME . DATE OF BIRTH . CALL BACK NUMBER . REASON FOR CALL**this is important as we prioritize the call backs  YOU WILL RECEIVE A CALL BACK THE SAME DAY AS LONG AS YOU CALL BEFORE 4:00 PM

## 2020-04-07 ENCOUNTER — Other Ambulatory Visit (HOSPITAL_COMMUNITY): Payer: Self-pay | Admitting: Surgery

## 2020-04-07 MED ORDER — POTASSIUM CHLORIDE CRYS ER 20 MEQ PO TBCR: 20.0000 meq | EXTENDED_RELEASE_TABLET | Freq: Every day | ORAL | 3 refills | Status: DC

## 2020-04-13 ENCOUNTER — Other Ambulatory Visit (HOSPITAL_COMMUNITY): Payer: BC Managed Care – PPO

## 2020-04-24 ENCOUNTER — Other Ambulatory Visit (HOSPITAL_COMMUNITY): Payer: Self-pay | Admitting: Cardiology

## 2020-04-24 MED ORDER — BIDIL 20-37.5 MG PO TABS
ORAL_TABLET | ORAL | 1 refills | Status: DC
Start: 2020-04-24 — End: 2020-08-17

## 2020-04-24 NOTE — Addendum Note (Signed)
Addended by: Rob Bunting R on: 04/24/2020 03:31 PM   Modules accepted: Orders

## 2020-04-28 ENCOUNTER — Ambulatory Visit (HOSPITAL_COMMUNITY)
Admission: RE | Admit: 2020-04-28 | Discharge: 2020-04-28 | Disposition: A | Discharge status: Home / Self Care | Payer: BC Managed Care – PPO | Source: Ambulatory Visit | Attending: Cardiology | Admitting: Cardiology

## 2020-04-28 ENCOUNTER — Other Ambulatory Visit: Payer: Self-pay

## 2020-04-28 DIAGNOSIS — I5042 Chronic combined systolic (congestive) and diastolic (congestive) heart failure: Secondary | ICD-10-CM | POA: Diagnosis not present

## 2020-04-28 LAB — BASIC METABOLIC PANEL
Anion gap: 9 (ref 5–15)
BUN: 18 mg/dL (ref 6–20)
CO2: 23 mmol/L (ref 22–32)
Calcium: 9.2 mg/dL (ref 8.9–10.3)
Chloride: 107 mmol/L (ref 98–111)
Creatinine, Ser: 1.32 mg/dL — ABNORMAL HIGH (ref 0.61–1.24)
GFR, Estimated: >60 mL/min (ref >60)
Glucose, Bld: 89 mg/dL (ref 70–99)
Potassium: 3.8 mmol/L (ref 3.5–5.1)
Sodium: 139 mmol/L (ref 135–145)

## 2020-05-31 ENCOUNTER — Encounter: Payer: Self-pay | Admitting: Family

## 2020-05-31 ENCOUNTER — Other Ambulatory Visit: Payer: Self-pay

## 2020-05-31 ENCOUNTER — Ambulatory Visit (INDEPENDENT_AMBULATORY_CARE_PROVIDER_SITE_OTHER): Payer: BC Managed Care – PPO | Admitting: Family

## 2020-05-31 VITALS — BP 104/66 | HR 92 | Temp 97.5°F | Ht 70.0 in | Wt >= 6400 oz

## 2020-05-31 DIAGNOSIS — Z113 Encounter for screening for infections with a predominantly sexual mode of transmission: Secondary | ICD-10-CM

## 2020-05-31 DIAGNOSIS — Z0000 Encounter for general adult medical examination without abnormal findings: Secondary | ICD-10-CM

## 2020-05-31 DIAGNOSIS — B20 Human immunodeficiency virus [HIV] disease: Secondary | ICD-10-CM | POA: Diagnosis not present

## 2020-05-31 MED ORDER — BIKTARVY 50-200-25 MG PO TABS: 1.0000 | ORAL_TABLET | Freq: Every day | ORAL | 4 refills | Status: DC

## 2020-05-31 NOTE — Assessment & Plan Note (Signed)
Mr. Randell Patient continues to have well-controlled HIV disease with good adherence and tolerance to his ART regimen of Biktarvy.  No signs/symptoms of opportunistic infection or progressive HIV disease.  Reviewed previous lab work and discussed plan of care.  Check blood work today.  Continue current dose of Biktarvy.  Plan for follow-up in 4 months or sooner if needed with lab work on the same day.

## 2020-05-31 NOTE — Progress Notes (Signed)
Subjective:    Patient ID: Todd Griffith, male    DOB: 16-Jul-1990, 30 y.o.   MRN: [id number]  Chief Complaint  Patient presents with  . Follow-up    Declined condoms      HPI:  Todd Griffith is a 30 y.o. male with HIV disease last seen on 02/01/2020 with well-controlled virus and good adherence and tolerance to his ART regimen of Biktarvy.  Viral load from 12/23/2019 was undetectable with CD4 count of 1100.  Here today for routine follow-up.  Todd Griffith continues to take his Biktarvy daily as prescribed with no adverse side effects or missed doses since his last office visit.  Overall feeling well today with no new concerns/complaints.  He is contemplating getting gastric sleeve surgery. Denies fevers, chills, night sweats, headaches, changes in vision, neck pain/stiffness, nausea, diarrhea, vomiting, lesions or rashes.  Mr. Todd Griffith with no problems obtaining his medication from the pharmacy remains covered through [facility].  He does have feelings of being down at times secondary to life stressors.  Denies suicidal ideations.  Has thought about counseling and is considering it.  No recreational or illicit drug use, tobacco use, and occasional alcohol consumption.  Declines vaccines.  Routine dental care is due and scheduled.  No Known Allergies    Outpatient Medications Prior to Visit  Medication Sig Dispense Refill  . albuterol (VENTOLIN HFA) 108 (90 Base) MCG/ACT inhaler Inhale 1-2 puffs into the lungs every 6 (six) hours as needed for wheezing or shortness of breath. 6.7 g 2  . aspirin 81 MG chewable tablet Chew 1 tablet (81 mg total) by mouth daily. 30 tablet 3  . atorvastatin (LIPITOR) 20 MG tablet TAKE 1 TABLET BY MOUTH EVERY DAY 90 tablet 3  . bisoprolol (ZEBETA) 10 MG tablet Take 1 tablet (10 mg total) by mouth daily. 30 tablet 11  . dapagliflozin propanediol (FARXIGA) 10 MG TABS tablet TAKE 1 TABLET (10 MG TOTAL) BY MOUTH DAILY BEFORE BREAKFAST. 90  tablet 3  . fluticasone (FLONASE) 50 MCG/ACT nasal spray Place 2 sprays into both nostrils daily. 16 g 0  . furosemide (LASIX) 80 MG tablet Take 1 tablet (80 mg total) by mouth in the morning AND 0.5 tablets (40 mg total) every evening. .. 45 tablet 6  . isosorbide-hydrALAZINE (BIDIL) 20-37.5 MG tablet TAKE 2 TABLETS BY MOUTH 3 TIMES DAILY. 540 tablet 1  . potassium chloride SA (KLOR-CON) 20 MEQ tablet Take 1 tablet (20 mEq total) by mouth daily. 90 tablet 3  . sacubitril-valsartan (ENTRESTO) 97-103 MG Take 1 tablet by mouth 2 (two) times daily. 180 tablet 3  . spironolactone (ALDACTONE) 25 MG tablet Take 1 tablet (25 mg total) by mouth daily. 90 tablet 1  . bictegravir-emtricitabine-tenofovir AF (BIKTARVY) 50-200-25 MG TABS tablet Take 1 tablet by mouth daily. 30 tablet 4   No facility-administered medications prior to visit.     Past Medical History:  Diagnosis Date  . Asthma   . Bronchitis   . CHF (congestive heart failure) (HCC)   . HIV (human immunodeficiency virus infection) (HCC)   . Obstructive sleep apnea      Past Surgical History:  Procedure Laterality Date  . RIGHT/LEFT HEART CATH AND CORONARY ANGIOGRAPHY N/A 09/21/2018   Procedure: RIGHT/LEFT HEART CATH AND CORONARY ANGIOGRAPHY;  Surgeon: Marykay Lex, MD;  Location: [facility] INVASIVE CV LAB;  Service: Cardiovascular;  Laterality: N/A;    Review of Systems  Constitutional: Negative for appetite change, chills, fatigue, fever and  unexpected weight change.  Eyes: Negative for visual disturbance.  Respiratory: Negative for cough, chest tightness, shortness of breath and wheezing.   Cardiovascular: Negative for chest pain and leg swelling.  Gastrointestinal: Negative for abdominal pain, constipation, diarrhea, nausea and vomiting.  Genitourinary: Negative for dysuria, flank pain, frequency, genital sores, hematuria and urgency.  Skin: Negative for rash.  Allergic/Immunologic: Negative for immunocompromised state.   Neurological: Negative for dizziness and headaches.      Objective:    BP 104/66   Pulse 92   Temp (!) 97.5 F (36.4 C) (Oral)   Ht 5\' 10"  (1.778 m)   Wt (!) 434 lb (196.9 kg)   SpO2 98%   BMI 62.27 kg/m  Nursing note and vital signs reviewed.  Physical Exam Constitutional:      General: He is not in acute distress.    Appearance: He is well-developed.  HENT:     Mouth/Throat:     Mouth: Oropharynx is clear and moist.  Eyes:     Conjunctiva/sclera: Conjunctivae normal.  Cardiovascular:     Rate and Rhythm: Normal rate and regular rhythm.     Pulses: Intact distal pulses.     Heart sounds: Normal heart sounds. No murmur heard. No friction rub. No gallop.   Pulmonary:     Effort: Pulmonary effort is normal. No respiratory distress.     Breath sounds: Normal breath sounds. No wheezing or rales.  Chest:     Chest wall: No tenderness.  Abdominal:     General: Bowel sounds are normal.     Palpations: Abdomen is soft.     Tenderness: There is no abdominal tenderness.  Musculoskeletal:     Cervical back: Neck supple.  Lymphadenopathy:     Cervical: No cervical adenopathy.  Skin:    General: Skin is warm and dry.     Findings: No rash.  Neurological:     Mental Status: He is alert and oriented to person, place, and time.  Psychiatric:        Mood and Affect: Mood and affect normal.        Behavior: Behavior normal.        Thought Content: Thought content normal.        Judgment: Judgment normal.      Depression screen [facility] 2/9 05/31/2020 02/01/2020 10/21/2019 02/23/2019 11/09/2018  Decreased Interest 1 0 1 0 0  Down, Depressed, Hopeless 1 1 1  0 0  PHQ - 2 Score 2 1 2  0 0  Altered sleeping 2 - 3 - -  Tired, decreased energy 1 - 1 - -  Change in appetite 1 - 0 - -  Feeling bad or failure about yourself  1 - 2 - -  Trouble concentrating 1 - 0 - -  Moving slowly or fidgety/restless 1 - 1 - -  Suicidal thoughts 0 - 0 - -  PHQ-9 Score 9 - 9 - -  Difficult doing  work/chores Not difficult at all - Somewhat difficult - -       Assessment & Plan:    Patient Active Problem List   Diagnosis Date Noted  . Healthcare maintenance 11/09/2018  . HIV disease (HCC) 09/22/2018  . Dilated cardiomyopathy (HCC) 09/21/2018  . Acute combined systolic and diastolic heart failure (HCC)   . Morbid obesity (HCC)   . Volume overload 09/17/2018  . Peripheral edema   . Orthopnea   . Cardiomegaly   . Uncomplicated asthma      Problem List  Items Addressed This Visit      Other   HIV disease (HCC) - Primary    Mr. Randell Patient continues to have well-controlled HIV disease with good adherence and tolerance to his ART regimen of Biktarvy.  No signs/symptoms of opportunistic infection or progressive HIV disease.  Reviewed previous lab work and discussed plan of care.  Check blood work today.  Continue current dose of Biktarvy.  Plan for follow-up in 4 months or sooner if needed with lab work on the same day.      Relevant Medications   bictegravir-emtricitabine-tenofovir AF (BIKTARVY) 50-200-25 MG TABS tablet   Other Relevant Orders   HIV-1 RNA quant-no reflex-bld   T-helper cell (CD4)- ([facility] clinic only)   Healthcare maintenance     Discussed importance of safe sexual practice to reduce risk of STI.  Condoms declined.  Declines vaccines today.  Scheduled for routine dental care.       Other Visit Diagnoses    Screening for STDs (sexually transmitted diseases)       Relevant Orders   RPR       I am having Tong M. Kobus maintain his albuterol, fluticasone, atorvastatin, bisoprolol, Entresto, aspirin, spironolactone, dapagliflozin propanediol, furosemide, potassium chloride SA, BiDil, and Biktarvy.   Meds ordered this encounter  Medications  . bictegravir-emtricitabine-tenofovir AF (BIKTARVY) 50-200-25 MG TABS tablet    Sig: Take 1 tablet by mouth daily.    Dispense:  30 tablet    Refill:  4    Order Specific Question:   Supervising Provider     Answer:   Judyann Munson [[id number]]     Follow-up: Return in about 4 months (around 09/30/2020).   Marcos Eke, MSN, FNP-C Nurse Practitioner [facility] Main number: [phone] \

## 2020-05-31 NOTE — Patient Instructions (Signed)
Nice to see you.  Continue to take your [place] daily as prescribed.   Refills have been sent to the pharmacy.  Plan for follow up in 4 months or sooner if needed with lab work on the same day.  Have a great day and stay safe!

## 2020-05-31 NOTE — Assessment & Plan Note (Signed)
   Discussed importance of safe sexual practice to reduce risk of STI.  Condoms declined.  Declines vaccines today.  Scheduled for routine dental care.

## 2020-06-01 LAB — T-HELPER CELL (CD4) - (RCID CLINIC ONLY)
CD4 % Helper T Cell: 46 % (ref 33–65)
CD4 T Cell Abs: 1259 /uL (ref 400–1790)

## 2020-06-02 LAB — HIV-1 RNA QUANT-NO REFLEX-BLD
HIV 1 RNA Quant: NOT DETECTED Copies/mL
HIV-1 RNA Quant, Log: NOT DETECTED Log cps/mL

## 2020-06-02 LAB — RPR: RPR Ser Ql: NONREACTIVE

## 2020-06-06 ENCOUNTER — Other Ambulatory Visit (HOSPITAL_COMMUNITY): Payer: Self-pay | Admitting: Cardiology

## 2020-07-03 ENCOUNTER — Encounter (HOSPITAL_COMMUNITY): Payer: BC Managed Care – PPO | Admitting: Cardiology

## 2020-07-07 ENCOUNTER — Ambulatory Visit (HOSPITAL_COMMUNITY)
Admission: RE | Admit: 2020-07-07 | Discharge: 2020-07-07 | Disposition: A | Discharge status: Home / Self Care | Payer: BC Managed Care – PPO | Source: Ambulatory Visit | Attending: Cardiology | Admitting: Cardiology

## 2020-07-07 ENCOUNTER — Encounter (HOSPITAL_COMMUNITY): Payer: Self-pay | Admitting: Cardiology

## 2020-07-07 ENCOUNTER — Other Ambulatory Visit: Payer: Self-pay

## 2020-07-07 VITALS — BP 112/70 | HR 78 | Wt >= 6400 oz

## 2020-07-07 DIAGNOSIS — Z21 Asymptomatic human immunodeficiency virus [HIV] infection status: Secondary | ICD-10-CM | POA: Insufficient documentation

## 2020-07-07 DIAGNOSIS — I340 Nonrheumatic mitral (valve) insufficiency: Secondary | ICD-10-CM | POA: Insufficient documentation

## 2020-07-07 DIAGNOSIS — I110 Hypertensive heart disease with heart failure: Secondary | ICD-10-CM | POA: Insufficient documentation

## 2020-07-07 DIAGNOSIS — I5042 Chronic combined systolic (congestive) and diastolic (congestive) heart failure: Secondary | ICD-10-CM | POA: Diagnosis not present

## 2020-07-07 DIAGNOSIS — Z8249 Family history of ischemic heart disease and other diseases of the circulatory system: Secondary | ICD-10-CM | POA: Insufficient documentation

## 2020-07-07 DIAGNOSIS — Z7982 Long term (current) use of aspirin: Secondary | ICD-10-CM | POA: Diagnosis not present

## 2020-07-07 DIAGNOSIS — G4733 Obstructive sleep apnea (adult) (pediatric): Secondary | ICD-10-CM | POA: Diagnosis not present

## 2020-07-07 DIAGNOSIS — F1011 Alcohol abuse, in remission: Secondary | ICD-10-CM | POA: Diagnosis not present

## 2020-07-07 DIAGNOSIS — I2510 Atherosclerotic heart disease of native coronary artery without angina pectoris: Secondary | ICD-10-CM | POA: Insufficient documentation

## 2020-07-07 DIAGNOSIS — Z7984 Long term (current) use of oral hypoglycemic drugs: Secondary | ICD-10-CM | POA: Diagnosis not present

## 2020-07-07 DIAGNOSIS — Z79899 Other long term (current) drug therapy: Secondary | ICD-10-CM | POA: Diagnosis not present

## 2020-07-07 DIAGNOSIS — I2583 Coronary atherosclerosis due to lipid rich plaque: Secondary | ICD-10-CM | POA: Diagnosis not present

## 2020-07-07 DIAGNOSIS — E669 Obesity, unspecified: Secondary | ICD-10-CM | POA: Diagnosis not present

## 2020-07-07 DIAGNOSIS — I428 Other cardiomyopathies: Secondary | ICD-10-CM | POA: Insufficient documentation

## 2020-07-07 DIAGNOSIS — I5022 Chronic systolic (congestive) heart failure: Secondary | ICD-10-CM | POA: Diagnosis not present

## 2020-07-07 DIAGNOSIS — Z6841 Body Mass Index (BMI) 40.0 and over, adult: Secondary | ICD-10-CM | POA: Diagnosis not present

## 2020-07-07 LAB — BASIC METABOLIC PANEL
Anion gap: 6 (ref 5–15)
BUN: 15 mg/dL (ref 6–20)
CO2: 27 mmol/L (ref 22–32)
Calcium: 9.1 mg/dL (ref 8.9–10.3)
Chloride: 103 mmol/L (ref 98–111)
Creatinine, Ser: 1.42 mg/dL — ABNORMAL HIGH (ref 0.61–1.24)
GFR, Estimated: >60 mL/min (ref >60)
Glucose, Bld: 91 mg/dL (ref 70–99)
Potassium: 3.8 mmol/L (ref 3.5–5.1)
Sodium: 136 mmol/L (ref 135–145)

## 2020-07-07 NOTE — Patient Instructions (Addendum)
EKG done today.  Labs done today. We will contact you only if your labs are abnormal.  No medication changes were made. Please continue all current medications as prescribed.  Please make sure you use your CPAP machine nightly.  Your physician recommends that you schedule a follow-up appointment in: 3 months with an echo prior to your exam.  Your physician has requested that you have an echocardiogram. Echocardiography is a painless test that uses sound waves to create images of your heart. It provides your doctor with information about the size and shape of your heart and how well your heart's chambers and valves are working. This procedure takes approximately one hour. There are no restrictions for this procedure.  If you have any questions or concerns before your next appointment please send [place] a message through [place] or call our office at [phone].    TO LEAVE A MESSAGE FOR THE NURSE SELECT OPTION 2, PLEASE LEAVE A MESSAGE INCLUDING: . YOUR NAME . DATE OF BIRTH . CALL BACK NUMBER . REASON FOR CALL**this is important as we prioritize the call backs  YOU WILL RECEIVE A CALL BACK THE SAME DAY AS LONG AS YOU CALL BEFORE 4:00 PM   Do the following things EVERYDAY: 1) Weigh yourself in the morning before breakfast. Write it down and keep it in a log. 2) Take your medicines as prescribed 3) Eat low salt foods--Limit salt (sodium) to 2000 mg per day.  4) Stay as active as you can everyday 5) Limit all fluids for the day to less than 2 liters   At the [facility], you and your health needs are our priority. As part of our continuing mission to provide you with exceptional heart care, we have created designated Provider Care Teams. These Care Teams include your primary Cardiologist (physician) and Advanced Practice Providers (APPs- Physician Assistants and Nurse Practitioners) who all work together to provide you with the care you need, when you need it.   You may  see any of the following providers on your designated Care Team at your next follow up: Marland Kitchen Dr Arvilla Meres . Dr Marca Ancona . Tonye Becket, NP . Robbie Lis, PA . Karle Plumber, PharmD   Please be sure to bring in all your medications bottles to every appointment.

## 2020-07-09 NOTE — Progress Notes (Signed)
PCP: Massie Maroon, FNP Cardiology: Dr. Shirlee Latch  30 y.o. with history of asthma, OSA, HIV+, and nonischemic cardiomyopathy returns for followup of CHF.  He was admitted in 6/20 with dyspnea and found to have CHF.  Echo showed EF 15-20% with diffuse hypokinesis, moderate-severe MR. LHC/RHC showed mild coronary disease, preserved cardiac output.  He was also diagnosed with HIV and started on Biktarvy.   Echo in 10/20 showed EF up to 35-40%, MR is only trivial.  Echo in 2/21 showed EF 35-40% with normal RV.   Weight is down 3 lbs.  No dyspnea with usual activities. No orthopnea/PND.  He has not been using his CPAP.  No palpitations or lightheadedness.    Labs (9/20): K 4.2, creatinine 1.48 Labs (10/20): LDL 73, HDL 26 Labs (11/20): K 4.1, creatinine 1.39 Labs (12/20): K 4.2, creatinine 1.43 Labs (1/22): K 3.8, creatinine 1.32, LDL 58, HDL 25  PMH: 1. Chronic systolic CHF: Nonischemic cardiomyopathy.   - LHC/RHC (6/20): mean RA 4, mean PCWP 21, CI 3; 70% D1 stenosis.  - Echo (6/20): EF 15-20%, normal RV, moderate-severe MR.  - Echo (10/20): EF 35-40% with diffuse hypokinesis, normal RV, trivial MR.   - Echo (2/21): EF 35-40%, diffuse hypokinesis, normal RV, normal IVC 2. HIV+: Followed by ID.  3. H/o ETOH abuse.  4. Asthma: Since childhood.  5. HTN 6. Morbid obesity.  7. OSA: Uses CPAP.  8. CAD: LHC in 6/20 with 70% D1 stenosis.   FH: Father, grandf ather with CAD, no cardiomyopathy.   SH: Lives in [place] with girlfriend and child, works for [employer] [place].  Nonsmoker.  Prior heavy ETOH, now drinks some on weekend but much less. Nonsmoker, no drugs.   ROS: All systems reviewed and negative except as per HPI.   Current Outpatient Medications  Medication Sig Dispense Refill  . albuterol (VENTOLIN HFA) 108 (90 Base) MCG/ACT inhaler Inhale 1-2 puffs into the lungs every 6 (six) hours as needed for wheezing or shortness of breath. 6.7 g 2  . atorvastatin (LIPITOR) 20 MG tablet  TAKE 1 TABLET BY MOUTH EVERY DAY 90 tablet 3  . bictegravir-emtricitabine-tenofovir AF (BIKTARVY) 50-200-25 MG TABS tablet Take 1 tablet by mouth daily. 30 tablet 4  . bisoprolol (ZEBETA) 10 MG tablet Take 1 tablet (10 mg total) by mouth daily. 30 tablet 11  . [facility] ASPIRIN ADULT LOW DOSE 81 MG chewable tablet CHEW 1 TABLET BY MOUTH DAILY. 30 tablet 11  . dapagliflozin propanediol (FARXIGA) 10 MG TABS tablet TAKE 1 TABLET (10 MG TOTAL) BY MOUTH DAILY BEFORE BREAKFAST. 90 tablet 3  . fluticasone (FLONASE) 50 MCG/ACT nasal spray Place 2 sprays into both nostrils daily. 16 g 0  . furosemide (LASIX) 80 MG tablet Take 1 tablet (80 mg total) by mouth in the morning AND 0.5 tablets (40 mg total) every evening. .. 45 tablet 6  . isosorbide-hydrALAZINE (BIDIL) 20-37.5 MG tablet TAKE 2 TABLETS BY MOUTH 3 TIMES DAILY. 540 tablet 1  . potassium chloride SA (KLOR-CON) 20 MEQ tablet Take 1 tablet (20 mEq total) by mouth daily. 90 tablet 3  . sacubitril-valsartan (ENTRESTO) 97-103 MG Take 1 tablet by mouth 2 (two) times daily. 180 tablet 3  . spironolactone (ALDACTONE) 25 MG tablet Take 1 tablet (25 mg total) by mouth daily. 90 tablet 1   No current facility-administered medications for this encounter.   BP 112/70   Pulse 78   Wt (!) 193.4 kg (426 lb 6.4 oz)   SpO2  96%   BMI 61.18 kg/m  General: NAD, obese.  Neck: No JVD, no thyromegaly or thyroid nodule.  Lungs: Clear to auscultation bilaterally with normal respiratory effort. CV: Nondisplaced PMI.  Heart regular S1/S2, no S3/S4, no murmur.  No peripheral edema.  No carotid bruit.  Normal pedal pulses.  Abdomen: Soft, nontender, no hepatosplenomegaly, no distention.  Skin: Intact without lesions or rashes.  Neurologic: Alert and oriented x 3.  Psych: Normal affect. Extremities: No clubbing or cyanosis.  HEENT: Normal.   Assessment/Plan: 1. Chronic systolic CHF: Nonischemic cardiomyopathy.  Echo in 6/20 showed EF 15-20% with moderate-severe MR.   RHC/LHC indicated nonischemic cardiomyopathy, cardiac output preserved. Cause of cardiomyopathy is uncertain, he was a heavy drinker in the past.  He has also been found to have HIV, which can cause a cardiomyopathy.  Echo in 10/20 showed EF improved up to 35-40%.  Echo in 2/21 showed EF 35-40% still.  He is not volume overloaded on exam, NYHA class II.  - Continue bisoprolol 10 mg daily.  - Continue Entresto 97/103 bid.  - Continue spironolactone 25 mg daily.  - Continue Bidil 2 tabs tid.   - Continue dapagliflozin 10 mg daily.  - Continue Lasix 80 qam/40 qpm.  BMET today.   - He will get repeat echo at followup in 3 months, has been just outside ICD range.     - I do not think that he would fit in scanner for cardiac MRI.  2. CAD: LHC (6/20) showed 70% D1 stenosis.   - Continue ASA 81 daily.  - Continue atorvastatin 20 mg daily. Good lipids in 1/22.  3. Mitral regurgitation: Much improved on most recent echoes.  4. ETOH abuse: Prior ETOH abuse may have contributed to his cardiomyopathy. He has cut back considerably.  5. OSA: Needs to use CPAP.  6. Obesity: He is interested in bariatric surgery.  - He needs to re-establish at bariatric program at [facility].    Followup in 3 months with echo  Marca Ancona 07/09/2020

## 2020-08-14 ENCOUNTER — Other Ambulatory Visit (HOSPITAL_COMMUNITY): Payer: Self-pay | Admitting: Cardiology

## 2020-08-18 ENCOUNTER — Other Ambulatory Visit (HOSPITAL_COMMUNITY): Payer: Self-pay | Admitting: Cardiology

## 2020-08-26 ENCOUNTER — Other Ambulatory Visit (HOSPITAL_COMMUNITY): Payer: Self-pay | Admitting: Cardiology

## 2020-09-01 ENCOUNTER — Ambulatory Visit (HOSPITAL_COMMUNITY)
Admission: EM | Admit: 2020-09-01 | Discharge: 2020-09-01 | Disposition: A | Discharge status: Home / Self Care | Payer: BC Managed Care – PPO | Attending: Medical Oncology | Admitting: Medical Oncology

## 2020-09-01 ENCOUNTER — Other Ambulatory Visit: Payer: Self-pay

## 2020-09-01 ENCOUNTER — Encounter (HOSPITAL_COMMUNITY): Payer: Self-pay

## 2020-09-01 DIAGNOSIS — L03011 Cellulitis of right finger: Secondary | ICD-10-CM | POA: Diagnosis not present

## 2020-09-01 MED ORDER — DOXYCYCLINE HYCLATE 100 MG PO CAPS: 100.0000 mg | ORAL_CAPSULE | Freq: Two times a day (BID) | ORAL | 0 refills | Status: DC

## 2020-09-01 NOTE — ED Triage Notes (Signed)
Pt presents with infection to right middle finger X 2 days.

## 2020-09-01 NOTE — ED Provider Notes (Signed)
[facility]    CSN: [id number] Arrival date & time: 09/01/20  1850      History   Chief Complaint Chief Complaint  Patient presents with  . Finger Infection    HPI Todd Griffith is a 30 y.o. male.   HPI  Finger infection: Patient states that for the past 2 days he has had right middle finger swelling and tenderness.  He denies any known injury.  He has noticed some yellow debris at the surface of the skin but no discharge.  No fevers or joint stiffness.  He has not tried anything yet for symptoms. Past Medical History:  Diagnosis Date  . Asthma   . Bronchitis   . CHF (congestive heart failure) (HCC)   . HIV (human immunodeficiency virus infection) (HCC)   . Obstructive sleep apnea     Patient Active Problem List   Diagnosis Date Noted  . Healthcare maintenance 11/09/2018  . HIV disease (HCC) 09/22/2018  . Dilated cardiomyopathy (HCC) 09/21/2018  . Acute combined systolic and diastolic heart failure (HCC)   . Morbid obesity (HCC)   . Volume overload 09/17/2018  . Peripheral edema   . Orthopnea   . Cardiomegaly   . Uncomplicated asthma     Past Surgical History:  Procedure Laterality Date  . RIGHT/LEFT HEART CATH AND CORONARY ANGIOGRAPHY N/A 09/21/2018   Procedure: RIGHT/LEFT HEART CATH AND CORONARY ANGIOGRAPHY;  Surgeon: Marykay Lex, MD;  Location: [facility] INVASIVE CV LAB;  Service: Cardiovascular;  Laterality: N/A;       Home Medications    Prior to Admission medications   Medication Sig Start Date End Date Taking? Authorizing Provider  albuterol (VENTOLIN HFA) 108 (90 Base) MCG/ACT inhaler Inhale 1-2 puffs into the lungs every 6 (six) hours as needed for wheezing or shortness of breath. 05/11/19   Laurey Morale, MD  atorvastatin (LIPITOR) 20 MG tablet TAKE 1 TABLET BY MOUTH EVERY DAY 08/30/20   Laurey Morale, MD  bictegravir-emtricitabine-tenofovir AF (BIKTARVY) 50-200-25 MG TABS tablet Take 1 tablet by mouth daily. 05/31/20   Veryl Speak, FNP  bisoprolol (ZEBETA) 10 MG tablet Take 1 tablet (10 mg total) by mouth daily. 11/15/19 11/14/20  Barbette Merino, NP  [facility] ASPIRIN ADULT LOW DOSE 81 MG chewable tablet CHEW 1 TABLET BY MOUTH DAILY. 06/08/20   Robbie Lis M, PA-C  dapagliflozin propanediol (FARXIGA) 10 MG TABS tablet TAKE 1 TABLET (10 MG TOTAL) BY MOUTH DAILY BEFORE BREAKFAST. 04/03/20   Sharol Harness, Brittainy M, PA-C  fluticasone (FLONASE) 50 MCG/ACT nasal spray Place 2 sprays into both nostrils daily. 09/16/19   Sudie Grumbling, NP  furosemide (LASIX) 80 MG tablet Take 1 tablet (80 mg total) by mouth in the morning AND 0.5 tablets (40 mg total) every evening. .. 04/04/20 05/04/20  Robbie Lis M, PA-C  isosorbide-hydrALAZINE (BIDIL) 20-37.5 MG tablet Take 2 tablets by mouth 3 (three) times daily. 08/21/20   Laurey Morale, MD  potassium chloride SA (KLOR-CON) 20 MEQ tablet Take 1 tablet (20 mEq total) by mouth daily. 04/07/20   Laurey Morale, MD  sacubitril-valsartan (ENTRESTO) 97-103 MG Take 1 tablet by mouth 2 (two) times daily. 12/15/19   Laurey Morale, MD  spironolactone (ALDACTONE) 25 MG tablet Take 1 tablet (25 mg total) by mouth daily. 02/07/20   Allayne Butcher, PA-C    Family History Family History  Problem Relation Age of Onset  . Hypertension Mother   . Diabetes Mellitus II Father   .  CAD Father        Died at 48 of 'CAD' while sleeping   . Obesity Sister   . Colon cancer Neg Hx   . Esophageal cancer Neg Hx     Social History Social History   Tobacco Use  . Smoking status: Former Smoker    Types: Cigarettes    Quit date: 05/31/2018    Years since quitting: 2.2  . Smokeless tobacco: Never Used  Vaping Use  . Vaping Use: Former  Substance Use Topics  . Alcohol use: Yes    Alcohol/week: 3.0 standard drinks    Types: 3 Cans of beer per week    Comment: socially   . Drug use: Not Currently     Allergies   Patient has no known allergies.   Review of Systems Review of  Systems  As stated above in HPI Physical Exam Triage Vital Signs ED Triage Vitals  Enc Vitals Group     BP 09/01/20 1933 129/77     Pulse Rate 09/01/20 1933 80     Resp 09/01/20 1933 20     Temp 09/01/20 1933 98.2 F (36.8 C)     Temp Source 09/01/20 1933 Oral     SpO2 09/01/20 1933 97 %     Weight --      Height --      Head Circumference --      Peak Flow --      Pain Score 09/01/20 1934 8     Pain Loc --      Pain Edu? --      Excl. in GC? --    No data found.  Updated Vital Signs BP 129/77 (BP Location: Right Arm)   Pulse 80   Temp 98.2 F (36.8 C) (Oral)   Resp 20   SpO2 97%    Physical Exam Vitals and nursing note reviewed.  Constitutional:      General: He is not in acute distress.    Appearance: Normal appearance. He is not ill-appearing, toxic-appearing or diaphoretic.  Cardiovascular:     Pulses: Normal pulses.  Musculoskeletal:        General: Swelling (mild) and tenderness present. Normal range of motion.  Skin:    Capillary Refill: Capillary refill takes less than 2 seconds.     Comments: There is mild edema of the right proximalmost middle finger around nail bed.  Mild yellow debris at the surface of the skin without any discharge or bleeding.  Neurological:     Mental Status: He is alert.      UC Treatments / Results  Labs (all labs ordered are listed, but only abnormal results are displayed) Labs Reviewed - No data to display  EKG   Radiology No results found.  Procedures Procedures (including critical care time)  Medications Ordered in UC Medications - No data to display  Initial Impression / Assessment and Plan / UC Course  I have reviewed the triage vital signs and the nursing notes.  Pertinent labs & imaging results that were available during my care of the patient were reviewed by me and considered in my medical decision making (see chart for details).     New.  Discussed paronychia with patient.  Given his history we are  going to start him on antibiotics to help prevent systemic infection.  We discussed an I&D but at this time patient would like to trial antibiotics and warm Epsom salt soaks first given that this is a mild  version of paronychia.  He is aware that if symptoms worsen or fail to improve within 3 days that he should return for I&D and is agreeable.  We discussed how to take his antibiotic along with common potential side effects and precautions. Final Clinical Impressions(s) / UC Diagnoses   Final diagnoses:  None   Discharge Instructions   None    ED Prescriptions    None     PDMP not reviewed this encounter.   Rushie Chestnut, [place] 09/01/20 1947

## 2020-09-04 ENCOUNTER — Other Ambulatory Visit: Payer: Self-pay

## 2020-09-04 ENCOUNTER — Encounter (HOSPITAL_COMMUNITY): Payer: Self-pay | Admitting: Emergency Medicine

## 2020-09-04 ENCOUNTER — Ambulatory Visit (HOSPITAL_COMMUNITY)
Admission: EM | Admit: 2020-09-04 | Discharge: 2020-09-04 | Disposition: A | Discharge status: Home / Self Care | Payer: BC Managed Care – PPO

## 2020-09-04 DIAGNOSIS — L03011 Cellulitis of right finger: Secondary | ICD-10-CM

## 2020-09-04 DIAGNOSIS — B20 Human immunodeficiency virus [HIV] disease: Secondary | ICD-10-CM | POA: Diagnosis not present

## 2020-09-04 NOTE — ED Provider Notes (Addendum)
[facility]    CSN: [id number] Arrival date & time: 09/04/20  1839      History   Chief Complaint No chief complaint on file.   HPI Todd Griffith is a 30 y.o. male.   Patient presents with right middle finger swelling and pain beginning 5 days ago. Has yellow to green discoloration occurring underneath the skin along the base of the nail bed. ROM intact. Seen in UC 3 days ago. Declined I& D. Taking antibiotics as prescribed.  Denies numbness and tingling. Bites nails.   Past Medical History:  Diagnosis Date  . Asthma   . Bronchitis   . CHF (congestive heart failure) (HCC)   . HIV (human immunodeficiency virus infection) (HCC)   . Obstructive sleep apnea     Patient Active Problem List   Diagnosis Date Noted  . Healthcare maintenance 11/09/2018  . HIV disease (HCC) 09/22/2018  . Dilated cardiomyopathy (HCC) 09/21/2018  . Acute combined systolic and diastolic heart failure (HCC)   . Morbid obesity (HCC)   . Volume overload 09/17/2018  . Peripheral edema   . Orthopnea   . Cardiomegaly   . Uncomplicated asthma     Past Surgical History:  Procedure Laterality Date  . RIGHT/LEFT HEART CATH AND CORONARY ANGIOGRAPHY N/A 09/21/2018   Procedure: RIGHT/LEFT HEART CATH AND CORONARY ANGIOGRAPHY;  Surgeon: Marykay Lex, MD;  Location: [facility] INVASIVE CV LAB;  Service: Cardiovascular;  Laterality: N/A;       Home Medications    Prior to Admission medications   Medication Sig Start Date End Date Taking? Authorizing Provider  albuterol (VENTOLIN HFA) 108 (90 Base) MCG/ACT inhaler Inhale 1-2 puffs into the lungs every 6 (six) hours as needed for wheezing or shortness of breath. 05/11/19   Laurey Morale, MD  atorvastatin (LIPITOR) 20 MG tablet TAKE 1 TABLET BY MOUTH EVERY DAY 08/30/20   Laurey Morale, MD  bictegravir-emtricitabine-tenofovir AF (BIKTARVY) 50-200-25 MG TABS tablet Take 1 tablet by mouth daily. 05/31/20   Veryl Speak, FNP  bisoprolol  (ZEBETA) 10 MG tablet Take 1 tablet (10 mg total) by mouth daily. 11/15/19 11/14/20  Barbette Merino, NP  [facility] ASPIRIN ADULT LOW DOSE 81 MG chewable tablet CHEW 1 TABLET BY MOUTH DAILY. 06/08/20   Robbie Lis M, PA-C  dapagliflozin propanediol (FARXIGA) 10 MG TABS tablet TAKE 1 TABLET (10 MG TOTAL) BY MOUTH DAILY BEFORE BREAKFAST. 04/03/20   Robbie Lis M, PA-C  doxycycline (VIBRAMYCIN) 100 MG capsule Take 1 capsule (100 mg total) by mouth 2 (two) times daily. 09/01/20   Rushie Chestnut, PA-C  fluticasone (FLONASE) 50 MCG/ACT nasal spray Place 2 sprays into both nostrils daily. 09/16/19   Sudie Grumbling, NP  furosemide (LASIX) 80 MG tablet Take 1 tablet (80 mg total) by mouth in the morning AND 0.5 tablets (40 mg total) every evening. .. 04/04/20 05/04/20  Robbie Lis M, PA-C  isosorbide-hydrALAZINE (BIDIL) 20-37.5 MG tablet Take 2 tablets by mouth 3 (three) times daily. 08/21/20   Laurey Morale, MD  potassium chloride SA (KLOR-CON) 20 MEQ tablet Take 1 tablet (20 mEq total) by mouth daily. 04/07/20   Laurey Morale, MD  sacubitril-valsartan (ENTRESTO) 97-103 MG Take 1 tablet by mouth 2 (two) times daily. 12/15/19   Laurey Morale, MD  spironolactone (ALDACTONE) 25 MG tablet Take 1 tablet (25 mg total) by mouth daily. 02/07/20   Allayne Butcher, PA-C    Family History Family History  Problem Relation  Age of Onset  . Hypertension Mother   . Diabetes Mellitus II Father   . CAD Father        Died at 32 of 'CAD' while sleeping   . Obesity Sister   . Colon cancer Neg Hx   . Esophageal cancer Neg Hx     Social History Social History   Tobacco Use  . Smoking status: Former Smoker    Types: Cigarettes    Quit date: 05/31/2018    Years since quitting: 2.2  . Smokeless tobacco: Never Used  Vaping Use  . Vaping Use: Former  Substance Use Topics  . Alcohol use: Yes    Alcohol/week: 3.0 standard drinks    Types: 3 Cans of beer per week    Comment: socially   . Drug use:  Not Currently     Allergies   Patient has no known allergies.   Review of Systems Review of Systems  Constitutional: Negative.   Respiratory: Negative.   Cardiovascular: Negative.   Skin: Positive for wound. Negative for color change, pallor and rash.  Neurological: Negative.      Physical Exam Triage Vital Signs ED Triage Vitals  Enc Vitals Group     BP 09/04/20 1924 112/72     Pulse Rate 09/04/20 1924 86     Resp 09/04/20 1924 19     Temp 09/04/20 1924 (!) 97.1 F (36.2 C)     Temp Source 09/04/20 1924 Temporal     SpO2 09/04/20 1924 98 %     Weight --      Height --      Head Circumference --      Peak Flow --      Pain Score 09/04/20 1923 4     Pain Loc --      Pain Edu? --      Excl. in GC? --    No data found.  Updated Vital Signs BP 112/72 (BP Location: Right Wrist)   Pulse 86   Temp (!) 97.1 F (36.2 C) (Temporal)   Resp 19   SpO2 98%   Visual Acuity Right Eye Distance:   Left Eye Distance:   Bilateral Distance:    Right Eye Near:   Left Eye Near:    Bilateral Near:     Physical Exam Constitutional:      Appearance: Normal appearance. He is normal weight.  HENT:     Head: Normocephalic.  Eyes:     Extraocular Movements: Extraocular movements intact.  Pulmonary:     Effort: Pulmonary effort is normal.  Musculoskeletal:        General: Normal range of motion.  Skin:    Comments: Swelling and tenderness of the proximal nail fold and cuticle, pus present underneath skin, tenderness over proximal nail fold and lateral and medial distal phalanx   Neurological:     Mental Status: He is alert and oriented to person, place, and time. Mental status is at baseline.  Psychiatric:        Mood and Affect: Mood normal.        Behavior: Behavior normal.      UC Treatments / Results  Labs (all labs ordered are listed, but only abnormal results are displayed) Labs Reviewed - No data to display  EKG   Radiology No results  found.  Procedures Procedures (including critical care time)  Medications Ordered in UC Medications - No data to display  Initial Impression / Assessment and Plan / UC Course  I have reviewed the triage vital signs and the nursing notes.  Pertinent labs & imaging results that were available during my care of the patient were reviewed by me and considered in my medical decision making (see chart for details).  Paronychia of right middle finger   Puncture aspiration completed, painease mist used for topical numbing, cleansed with betadine, moderate purulent and bloody drainage, covered with guaze and paper tape   1. Continue antibiotic as prescribed 2. Warm soaks at least 4 times a day 3. Strict return precautions given for worsening infection or non healing site 4. Advised patient to stop nail biting  Final Clinical Impressions(s) / UC Diagnoses   Final diagnoses:  None   Discharge Instructions   None    ED Prescriptions    None     PDMP not reviewed this encounter.   Valinda Hoar, NP 09/04/20 1947    Valinda Hoar, NP 09/04/20 [phone]

## 2020-09-04 NOTE — Discharge Instructions (Addendum)
Finish antibiotics as prescribed  Can use tylenol or ibuprofen as needed for pain   Warm soaks at least four times a day, the more or better, this help it drains  Follow up for increased pain or swelling, fever or chills, or if area is not healing

## 2020-09-04 NOTE — ED Triage Notes (Signed)
Pt Is present today for f/u with his right middle finger. Pt states that his finger is not any better and still causes some discomfort. Pt finger is still swollen and he is noticing discoloration.

## 2020-09-21 ENCOUNTER — Other Ambulatory Visit (HOSPITAL_COMMUNITY): Payer: Self-pay | Admitting: Cardiology

## 2020-10-04 ENCOUNTER — Other Ambulatory Visit (HOSPITAL_COMMUNITY): Payer: Self-pay | Admitting: Cardiology

## 2020-10-11 ENCOUNTER — Other Ambulatory Visit: Payer: Self-pay

## 2020-10-11 ENCOUNTER — Ambulatory Visit (HOSPITAL_BASED_OUTPATIENT_CLINIC_OR_DEPARTMENT_OTHER)
Admission: RE | Admit: 2020-10-11 | Discharge: 2020-10-11 | Disposition: A | Discharge status: Home / Self Care | Payer: BC Managed Care – PPO | Source: Ambulatory Visit | Attending: Cardiology | Admitting: Cardiology

## 2020-10-11 ENCOUNTER — Ambulatory Visit (HOSPITAL_COMMUNITY)
Admission: RE | Admit: 2020-10-11 | Discharge: 2020-10-11 | Disposition: A | Discharge status: Home / Self Care | Payer: BC Managed Care – PPO | Source: Ambulatory Visit | Attending: Cardiology | Admitting: Cardiology

## 2020-10-11 ENCOUNTER — Telehealth: Payer: Self-pay | Admitting: Pharmacist

## 2020-10-11 VITALS — BP 132/98 | HR 80 | Ht 70.0 in | Wt >= 6400 oz

## 2020-10-11 DIAGNOSIS — Z6841 Body Mass Index (BMI) 40.0 and over, adult: Secondary | ICD-10-CM

## 2020-10-11 DIAGNOSIS — I110 Hypertensive heart disease with heart failure: Secondary | ICD-10-CM | POA: Diagnosis not present

## 2020-10-11 DIAGNOSIS — I340 Nonrheumatic mitral (valve) insufficiency: Secondary | ICD-10-CM | POA: Insufficient documentation

## 2020-10-11 DIAGNOSIS — I5042 Chronic combined systolic (congestive) and diastolic (congestive) heart failure: Secondary | ICD-10-CM

## 2020-10-11 DIAGNOSIS — Z7982 Long term (current) use of aspirin: Secondary | ICD-10-CM | POA: Diagnosis not present

## 2020-10-11 DIAGNOSIS — I2510 Atherosclerotic heart disease of native coronary artery without angina pectoris: Secondary | ICD-10-CM | POA: Insufficient documentation

## 2020-10-11 DIAGNOSIS — I428 Other cardiomyopathies: Secondary | ICD-10-CM | POA: Insufficient documentation

## 2020-10-11 DIAGNOSIS — Z87891 Personal history of nicotine dependence: Secondary | ICD-10-CM | POA: Insufficient documentation

## 2020-10-11 DIAGNOSIS — J45909 Unspecified asthma, uncomplicated: Secondary | ICD-10-CM | POA: Insufficient documentation

## 2020-10-11 DIAGNOSIS — Z79899 Other long term (current) drug therapy: Secondary | ICD-10-CM | POA: Diagnosis not present

## 2020-10-11 DIAGNOSIS — G4733 Obstructive sleep apnea (adult) (pediatric): Secondary | ICD-10-CM | POA: Diagnosis not present

## 2020-10-11 DIAGNOSIS — Z8249 Family history of ischemic heart disease and other diseases of the circulatory system: Secondary | ICD-10-CM | POA: Insufficient documentation

## 2020-10-11 LAB — ECHOCARDIOGRAM COMPLETE
Area-P 1/2: 3.76 cm2
Calc EF: 51.8 %
S' Lateral: 3.9 cm
Single Plane A2C EF: 51 %
Single Plane A4C EF: 49.6 %

## 2020-10-11 NOTE — Progress Notes (Signed)
PCP: Massie Maroon, FNP Cardiology: Dr. Shirlee Latch  30 y.o. with history of asthma, OSA, HIV+, and nonischemic cardiomyopathy returns for followup of CHF.  He was admitted in 6/20 with dyspnea and found to have CHF.  Echo showed EF 15-20% with diffuse hypokinesis, moderate-severe MR. LHC/RHC showed mild coronary disease, preserved cardiac output.  He was also diagnosed with HIV and started on Biktarvy.   Echo in 10/20 showed EF up to 35-40%, MR is only trivial.  Echo in 2/21 showed EF 35-40% with normal RV.  Echo was done today and reviewed, EF up to 45% with normal RV.   He still has not managed to lose any weight, weight in fact is up 5 lbs.  He is interested in bariatric surgery. No significant exertional dyspnea.  No orthopnea/PND.  He walks for exercise.  No chest pain, palpitations, or lightheadedness.   Labs (9/20): K 4.2, creatinine 1.48 Labs (10/20): LDL 73, HDL 26 Labs (11/20): K 4.1, creatinine 1.39 Labs (12/20): K 4.2, creatinine 1.43 Labs (1/22): K 3.8, creatinine 1.32, LDL 58, HDL 25 Labs (4/22): K 3.8, creatinine 1.4  PMH: 1. Chronic systolic CHF: Nonischemic cardiomyopathy.   - LHC/RHC (6/20): mean RA 4, mean PCWP 21, CI 3; 70% D1 stenosis.  - Echo (6/20): EF 15-20%, normal RV, moderate-severe MR.  - Echo (10/20): EF 35-40% with diffuse hypokinesis, normal RV, trivial MR.   - Echo (2/21): EF 35-40%, diffuse hypokinesis, normal RV, normal IVC - Echo (7/22): EF 45%, normal RV.  2. HIV+: Followed by ID.  3. H/o ETOH abuse.  4. Asthma: Since childhood.  5. HTN 6. Morbid obesity.  7. OSA: Uses CPAP.  8. CAD: LHC in 6/20 with 70% D1 stenosis.   FH: Father, grandf ather with CAD, no cardiomyopathy.   SH: Lives in [place] with girlfriend and child, works for [employer] of [place].  Nonsmoker.  Prior heavy ETOH, now drinks some on weekend but much less. Nonsmoker, no drugs.   ROS: All systems reviewed and negative except as per HPI.   Current Outpatient Medications   Medication Sig Dispense Refill   albuterol (VENTOLIN HFA) 108 (90 Base) MCG/ACT inhaler Inhale 1-2 puffs into the lungs every 6 (six) hours as needed for wheezing or shortness of breath. 6.7 g 2   atorvastatin (LIPITOR) 20 MG tablet TAKE 1 TABLET BY MOUTH EVERY DAY 90 tablet 3   bictegravir-emtricitabine-tenofovir AF (BIKTARVY) 50-200-25 MG TABS tablet Take 1 tablet by mouth daily. 30 tablet 4   bisoprolol (ZEBETA) 10 MG tablet Take 1 tablet (10 mg total) by mouth daily. 30 tablet 11   CVS ASPIRIN ADULT LOW DOSE 81 MG chewable tablet CHEW 1 TABLET BY MOUTH DAILY. 30 tablet 11   dapagliflozin propanediol (FARXIGA) 10 MG TABS tablet TAKE 1 TABLET (10 MG TOTAL) BY MOUTH DAILY BEFORE BREAKFAST. 90 tablet 3   doxycycline (VIBRAMYCIN) 100 MG capsule Take 1 capsule (100 mg total) by mouth 2 (two) times daily. 20 capsule 0   fluticasone (FLONASE) 50 MCG/ACT nasal spray Place 2 sprays into both nostrils daily. 16 g 0   furosemide (LASIX) 80 MG tablet TAKE 1 TABLET BY MOUTH EVERY DAY 30 tablet 0   isosorbide-hydrALAZINE (BIDIL) 20-37.5 MG tablet Take 2 tablets by mouth 3 (three) times daily. 270 tablet 3   sacubitril-valsartan (ENTRESTO) 97-103 MG Take 1 tablet by mouth 2 (two) times daily. 180 tablet 3   spironolactone (ALDACTONE) 25 MG tablet TAKE 1 TABLET BY MOUTH EVERY DAY 60 tablet 0  No current facility-administered medications for this encounter.   BP (!) 132/98   Pulse 80   Ht 5\' 10"  (1.778 m)   Wt (!) 195.5 kg (431 lb)   SpO2 98%   BMI 61.84 kg/m  General: NAD, obese Neck: No JVD, no thyromegaly or thyroid nodule.  Lungs: Clear to auscultation bilaterally with normal respiratory effort. CV: Nondisplaced PMI.  Heart regular S1/S2, no S3/S4, no murmur.  No peripheral edema.  No carotid bruit.  Normal pedal pulses.  Abdomen: Soft, nontender, no hepatosplenomegaly, no distention.  Skin: Intact without lesions or rashes.  Neurologic: Alert and oriented x 3.  Psych: Normal  affect. Extremities: No clubbing or cyanosis.  HEENT: Normal.   Assessment/Plan: 1. Chronic systolic CHF: Nonischemic cardiomyopathy.  Echo in 6/20 showed EF 15-20% with moderate-severe MR.  RHC/LHC indicated nonischemic cardiomyopathy, cardiac output preserved. Cause of cardiomyopathy is uncertain, he was a heavy drinker in the past.  He has also been found to have HIV, which can cause a cardiomyopathy, but he has been on meds for this and it is controlled.  Echo in 10/20 showed EF improved up to 35-40%.  Echo in 2/21 showed EF 35-40% still.  Echo today showed EF up to 45%.  He is not volume overloaded on exam, NYHA class II.  - Continue bisoprolol 10 mg daily.  - Continue Entresto 97/103 bid.  - Continue spironolactone 25 mg daily.  - Continue Bidil 2 tabs tid.   - Continue dapagliflozin 10 mg daily.  - Continue Lasix 80 daily.  BMET today.   - EF is outside of ICD range.      - I do not think that he would fit in scanner for cardiac MRI.  2. CAD: LHC (6/20) showed 70% D1 stenosis.   - Continue ASA 81 daily.  - Continue atorvastatin 20 mg daily. Good lipids in 1/22.  3. Mitral regurgitation: Much improved on most recent echoes.  4. ETOH abuse: Prior ETOH abuse may have contributed to his cardiomyopathy. He has cut back considerably.  5. OSA: Needs to use CPAP.  6. Obesity: He is interested in bariatric surgery.  - He needs to re-establish at bariatric program at [facility].   - I will [facility] to [facility].  - I will refer to pharmacy clinic for semaglutide.   Followup in 6 months with APP, 3 months for 2/22 10/11/2020

## 2020-10-11 NOTE — Telephone Encounter (Addendum)
Received referral from Dr Shirlee Latch regarding semaglutide for weight loss. Weight at office visit today 431 lb, BMI 62.  Prior authorization for [facility] submitted and approved through 05/14/21. Will need to start on Ozempic for the first 3 dose titrations since lower doses of Wegovy are on national backorder, and will transition to [facility] starting at 1.7mg  dose that's still available.  Called pt to discuss and left message. Typically prefer to see patients in office for injection training and to start on samples initially, but pt lives 2.5 hours away from clinic. Will discuss when he returns call.

## 2020-10-11 NOTE — Patient Instructions (Signed)
Labs done today, your results will be available in MyChart, we will contact you for abnormal readings.  Your physician recommends that you return for lab work in: 3 months, we have provided you a prescription to have this done locally  You have been referred to Pharmacy Clinic for weight loss medication, they will call you for an appointment  You have been referred to [facility], they will call you for an appointment  Please call our office in December to schedule your follow up appointment  Do the following things EVERYDAY: Weigh yourself in the morning before breakfast. Write it down and keep it in a log. Take your medicines as prescribed Eat low salt foods--Limit salt (sodium) to 2000 mg per day.  Stay as active as you can everyday Limit all fluids for the day to less than 2 liters  If you have any questions or concerns before your next appointment please send [place] a message through [place] or call our office at [phone].    TO LEAVE A MESSAGE FOR THE NURSE SELECT OPTION 2, PLEASE LEAVE A MESSAGE INCLUDING: YOUR NAME DATE OF BIRTH CALL BACK NUMBER REASON FOR CALL**this is important as we prioritize the call backs  YOU WILL RECEIVE A CALL BACK THE SAME DAY AS LONG AS YOU CALL BEFORE 4:00 PM  milAt the [facility], you and your health needs are our priority. As part of our continuing mission to provide you with exceptional heart care, we have created designated Provider Care Teams. These Care Teams include your primary Cardiologist (physician) and Advanced Practice Providers (APPs- Physician Assistants and Nurse Practitioners) who all work together to provide you with the care you need, when you need it.   You may see any of the following providers on your designated Care Team at your next follow up: Dr Arvilla Meres Dr Marca Ancona Dr Brandon Melnick, NP Robbie Lis, [place] Mikki Santee Karle Plumber,  PharmD   Please be sure to bring in all your medications bottles to every appointment.

## 2020-10-11 NOTE — Progress Notes (Signed)
  Echocardiogram 2D Echocardiogram has been performed.  Burnard Hawthorne 10/11/2020, 11:44 AM

## 2020-10-12 ENCOUNTER — Other Ambulatory Visit: Payer: Self-pay

## 2020-10-12 ENCOUNTER — Encounter: Payer: Self-pay | Admitting: Family

## 2020-10-12 ENCOUNTER — Ambulatory Visit (INDEPENDENT_AMBULATORY_CARE_PROVIDER_SITE_OTHER): Payer: BC Managed Care – PPO | Admitting: Family

## 2020-10-12 VITALS — BP 123/82 | HR 89 | Temp 98.1°F | Wt >= 6400 oz

## 2020-10-12 DIAGNOSIS — Z0000 Encounter for general adult medical examination without abnormal findings: Secondary | ICD-10-CM

## 2020-10-12 DIAGNOSIS — B20 Human immunodeficiency virus [HIV] disease: Secondary | ICD-10-CM

## 2020-10-12 MED ORDER — BIKTARVY 50-200-25 MG PO TABS: 1.0000 | ORAL_TABLET | Freq: Every day | ORAL | 6 refills | Status: DC

## 2020-10-12 NOTE — Assessment & Plan Note (Signed)
   Discussed importance of safe sexual practice to reduce risk of STI.  Condoms offered and declined.  Routine dental care is up-to-date per recommendations.  Declines vaccines.

## 2020-10-12 NOTE — Assessment & Plan Note (Signed)
Mr. Bramblett is considering bariatric surgery and currently evaluating his options.  From ID standpoint there is no restriction for bariatric surgery as he has well-controlled.

## 2020-10-12 NOTE — Patient Instructions (Signed)
Nice to see you.  Continue to take your medication daily.   Refills have been sent to the pharmacy.   Plan for follow up in 4 months or sooner if needed with lab work on the same day.  Have a great day and stay safe!

## 2020-10-12 NOTE — Assessment & Plan Note (Signed)
Mr. Bhat continues to have well-controlled HIV disease with good adherence and tolerance to his ART regimen of Biktarvy.  No signs/symptoms of opportunistic infection or progressive HIV.  We reviewed lab work and discussed plan of care.  Check blood work today.  Continue current dose of Biktarvy.  Plan for follow-up in 4 months or sooner if needed with lab work on the same day.

## 2020-10-12 NOTE — Progress Notes (Signed)
Brief Narrative   Patient ID: Todd Griffith, male    DOB: [birth year], 30 y.o.   MRN: [id number]  Mr. Sieben if a 30 y/o AA male diagnosed with HIV disease in June of 2020 with risk factor being heterosexual contact. Initial viral load of 739 with CD4 count of 41,600.  Entered care at [facility] Stage 1. No history of opportunistic infection. [id number] negative. Sole ART regimen of Biktarvy.   Subjective:    Chief Complaint  Patient presents with   HIV Positive/AIDS    HPI:  Todd Griffith is a 30 y.o. male who is HIV disease last seen on 05/31/2020 with well-controlled virus and good adherence and tolerance to his ART regimen of Biktarvy.  Viral load at the time was undetectable with CD4 count of 1259. Here today for routine follow up.   Mr. Okane continues to take his Biktarvy daily as prescribed with no adverse side effects or missed doses since his last office visit.  Overall feeling well today with no new concerns/complaints.  Considering bariatric surgery.  Denies fevers, chills, night sweats, headaches, changes in vision, neck pain/stiffness, nausea, diarrhea, vomiting, lesions or rashes.  Mr. Lukehart has no problems obtaining his medication from the pharmacy and remains covered through [facility].  Denies feelings of being down, depressed, or hopeless recently.  No recreational or illicit drug use with occasional alcohol consumption and hookah use.  Condoms offered and declined.  Routine dental care is up-to-date.  No Known Allergies    Outpatient Medications Prior to Visit  Medication Sig Dispense Refill   albuterol (VENTOLIN HFA) 108 (90 Base) MCG/ACT inhaler Inhale 1-2 puffs into the lungs every 6 (six) hours as needed for wheezing or shortness of breath. 6.7 g 2   atorvastatin (LIPITOR) 20 MG tablet TAKE 1 TABLET BY MOUTH EVERY DAY 90 tablet 3   bisoprolol (ZEBETA) 10 MG tablet Take 1 tablet (10 mg total) by mouth daily. 30 tablet 11   [facility] ASPIRIN ADULT  LOW DOSE 81 MG chewable tablet CHEW 1 TABLET BY MOUTH DAILY. 30 tablet 11   dapagliflozin propanediol (FARXIGA) 10 MG TABS tablet TAKE 1 TABLET (10 MG TOTAL) BY MOUTH DAILY BEFORE BREAKFAST. 90 tablet 3   doxycycline (VIBRAMYCIN) 100 MG capsule Take 1 capsule (100 mg total) by mouth 2 (two) times daily. 20 capsule 0   fluticasone (FLONASE) 50 MCG/ACT nasal spray Place 2 sprays into both nostrils daily. 16 g 0   furosemide (LASIX) 80 MG tablet TAKE 1 TABLET BY MOUTH EVERY DAY 30 tablet 0   isosorbide-hydrALAZINE (BIDIL) 20-37.5 MG tablet Take 2 tablets by mouth 3 (three) times daily. 270 tablet 3   sacubitril-valsartan (ENTRESTO) 97-103 MG Take 1 tablet by mouth 2 (two) times daily. 180 tablet 3   spironolactone (ALDACTONE) 25 MG tablet TAKE 1 TABLET BY MOUTH EVERY DAY 60 tablet 0   bictegravir-emtricitabine-tenofovir AF (BIKTARVY) 50-200-25 MG TABS tablet Take 1 tablet by mouth daily. 30 tablet 4   No facility-administered medications prior to visit.     Past Medical History:  Diagnosis Date   Asthma    Bronchitis    CHF (congestive heart failure) (HCC)    HIV (human immunodeficiency virus infection) (HCC)    Obstructive sleep apnea      Past Surgical History:  Procedure Laterality Date   RIGHT/LEFT HEART CATH AND CORONARY ANGIOGRAPHY N/A 09/21/2018   Procedure: RIGHT/LEFT HEART CATH AND CORONARY ANGIOGRAPHY;  Surgeon: Marykay Lex, MD;  Location: [facility]  INVASIVE CV LAB;  Service: Cardiovascular;  Laterality: N/A;       Review of Systems  Constitutional:  Negative for appetite change, chills, fatigue, fever and unexpected weight change.  Eyes:  Negative for visual disturbance.  Respiratory:  Negative for cough, chest tightness, shortness of breath and wheezing.   Cardiovascular:  Negative for chest pain and leg swelling.  Gastrointestinal:  Negative for abdominal pain, constipation, diarrhea, nausea and vomiting.  Genitourinary:  Negative for dysuria, flank pain, frequency,  genital sores, hematuria and urgency.  Skin:  Negative for rash.  Allergic/Immunologic: Negative for immunocompromised state.  Neurological:  Negative for dizziness and headaches.     Objective:    BP 123/82   Pulse 89   Temp 98.1 F (36.7 C) (Oral)   Wt (!) 433 lb (196.4 kg)   SpO2 94%   BMI 62.13 kg/m  Nursing note and vital signs reviewed.  Physical Exam Constitutional:      General: He is not in acute distress.    Appearance: He is well-developed. He is obese.  Eyes:     Conjunctiva/sclera: Conjunctivae normal.  Cardiovascular:     Rate and Rhythm: Normal rate and regular rhythm.     Heart sounds: Normal heart sounds. No murmur heard.   No friction rub. No gallop.  Pulmonary:     Effort: Pulmonary effort is normal. No respiratory distress.     Breath sounds: Normal breath sounds. No wheezing or rales.  Chest:     Chest wall: No tenderness.  Abdominal:     General: Bowel sounds are normal.     Palpations: Abdomen is soft.     Tenderness: There is no abdominal tenderness.  Musculoskeletal:     Cervical back: Neck supple.  Lymphadenopathy:     Cervical: No cervical adenopathy.  Skin:    General: Skin is warm and dry.     Findings: No rash.  Neurological:     Mental Status: He is alert and oriented to person, place, and time.  Psychiatric:        Behavior: Behavior normal.        Thought Content: Thought content normal.        Judgment: Judgment normal.     Depression screen [facility] 2/9 05/31/2020 02/01/2020 10/21/2019 02/23/2019 11/09/2018  Decreased Interest 1 0 1 0 0  Down, Depressed, Hopeless 1 1 1  0 0  PHQ - 2 Score 2 1 2  0 0  Altered sleeping 2 - 3 - -  Tired, decreased energy 1 - 1 - -  Change in appetite 1 - 0 - -  Feeling bad or failure about yourself  1 - 2 - -  Trouble concentrating 1 - 0 - -  Moving slowly or fidgety/restless 1 - 1 - -  Suicidal thoughts 0 - 0 - -  PHQ-9 Score 9 - 9 - -  Difficult doing work/chores Not difficult at all - Somewhat  difficult - -       Assessment & Plan:    Patient Active Problem List   Diagnosis Date Noted   Healthcare maintenance 11/09/2018   HIV disease (HCC) 09/22/2018   Dilated cardiomyopathy (HCC) 09/21/2018   Acute combined systolic and diastolic heart failure (HCC)    Morbid obesity (HCC)    Volume overload 09/17/2018   Peripheral edema    Orthopnea    Cardiomegaly    Uncomplicated asthma      Problem List Items Addressed This Visit  Other   Morbid obesity [facility])    Mr. Watlington is considering bariatric surgery and currently evaluating his options.  From ID standpoint there is no restriction for bariatric surgery as he has well-controlled.       HIV disease (HCC) - Primary    Mr. Hearne continues to have well-controlled HIV disease with good adherence and tolerance to his ART regimen of Biktarvy.  No signs/symptoms of opportunistic infection or progressive HIV.  We reviewed lab work and discussed plan of care.  Check blood work today.  Continue current dose of Biktarvy.  Plan for follow-up in 4 months or sooner if needed with lab work on the same day.       Relevant Medications   bictegravir-emtricitabine-tenofovir AF (BIKTARVY) 50-200-25 MG TABS tablet   Other Relevant Orders   T-helper cell (CD4)- ([facility] clinic only)   HIV-1 RNA quant-no reflex-bld   Healthcare maintenance    Discussed importance of safe sexual practice to reduce risk of STI.  Condoms offered and declined. Routine dental care is up-to-date per recommendations. Declines vaccines.         I am having Farhan M. Demeyer maintain his albuterol, fluticasone, bisoprolol, Entresto, dapagliflozin propanediol, CVS Aspirin Adult Low Dose, isosorbide-hydrALAZINE, atorvastatin, doxycycline, spironolactone, furosemide, and Biktarvy.   Meds ordered this encounter  Medications   bictegravir-emtricitabine-tenofovir AF (BIKTARVY) 50-200-25 MG TABS tablet    Sig: Take 1 tablet by mouth daily.    Dispense:  30  tablet    Refill:  6    Order Specific Question:   Supervising Provider    Answer:   Judyann Munson [[id number]]     Follow-up: Return in about 4 months (around 02/12/2021), or if symptoms worsen or fail to improve.   Marcos Eke, MSN, FNP-C Nurse Practitioner [facility] for Infectious Disease [facility] Main number: [phone]

## 2020-10-13 LAB — T-HELPER CELL (CD4) - (RCID CLINIC ONLY)
CD4 % Helper T Cell: 44 % (ref 33–65)
CD4 T Cell Abs: 1212 /uL (ref 400–1790)

## 2020-10-17 ENCOUNTER — Telehealth: Payer: Self-pay

## 2020-10-17 ENCOUNTER — Other Ambulatory Visit (HOSPITAL_COMMUNITY): Payer: Self-pay | Admitting: Cardiology

## 2020-10-17 ENCOUNTER — Other Ambulatory Visit (HOSPITAL_COMMUNITY): Payer: Self-pay

## 2020-10-17 LAB — HIV-1 RNA QUANT-NO REFLEX-BLD
HIV 1 RNA Quant: NOT DETECTED Copies/mL
HIV-1 RNA Quant, Log: NOT DETECTED Log cps/mL

## 2020-10-17 NOTE — Telephone Encounter (Signed)
Called patient to discuss weight management therapy with [facility] and try to schedule an office visit to start the patient with samples of Ozempic due to [facility] backorder. It appears he may live in [facility] so office visit for injection training and to pick up samples would be preferable.   Left message for patient to call back.

## 2020-10-17 NOTE — Telephone Encounter (Signed)
Medication Samples have been provided to the patient.  Drug name: Biktarvy        Strength: 50/200/25 mg       Qty: 7 (1 bottle)   LOT: [id number]   Exp.Date: 06/24  Dosing instructions: Take one tablet by mouth once daily  Clearance Coots, CPhT Specialty Pharmacy Patient [facility] for Infectious Disease Phone: [phone] Fax:  [phone]

## 2020-10-24 NOTE — Telephone Encounter (Signed)
Called patient regarding the below but was unable to reach him. Left voicemail requesting call back.

## 2020-10-25 ENCOUNTER — Other Ambulatory Visit (HOSPITAL_COMMUNITY): Payer: Self-pay

## 2020-10-25 ENCOUNTER — Other Ambulatory Visit: Payer: Self-pay | Admitting: Family

## 2020-10-25 DIAGNOSIS — B20 Human immunodeficiency virus [HIV] disease: Secondary | ICD-10-CM

## 2020-10-25 NOTE — Telephone Encounter (Signed)
[facility] [place], they confirm [facility].   Sandie Ano, RN

## 2020-10-27 ENCOUNTER — Other Ambulatory Visit (HOSPITAL_COMMUNITY): Payer: Self-pay | Admitting: Cardiology

## 2020-11-07 NOTE — Telephone Encounter (Signed)
Contacted patient for the fourth time regarding starting GLP1 for weight loss. Unable to reach. Left voicemail informing him that this would be my last attempt to reach him and to call back if he is interested in starting this medication. Provided direct phone #.

## 2020-11-08 ENCOUNTER — Encounter (HOSPITAL_COMMUNITY): Payer: Self-pay | Admitting: *Deleted

## 2020-11-08 NOTE — Telephone Encounter (Signed)
Pt has been left multiple messages, mychart message sent

## 2020-11-09 NOTE — Telephone Encounter (Signed)
Pt returned call to clinic after MyChart message was sent yesterday. Pt denies personal/family history of thyroid cancer and is interested in starting GLP1RA therapy for weight loss. Scheduled appt with PharmD tomorrow 8/12 to initiate semaglutide.  [facility] prior authorization already approved through 05/14/21.  Ozempic available on formulary without PA needed.   Since first 3 starting doses of Wegovy are on national backorder, will provide pt with a sample Ozempic pen for the first month, then send in rx for month 2 and 3, then transition to [facility] for month 4 dosing and after. Dosing titration schedule noted below:  Month 1: Inject Ozempic 0.25mg  SQ once weekly Month 2: Inject Ozempic 0.5mg   SQ once weekly Month 3: Inject Ozempic 1mg  SQ once weekly Month 4: Inject Wegovy 1.7mg  SQ once weekly Month 5+: Inject Wegovy 2.4mg  SQ once weekly

## 2020-11-10 ENCOUNTER — Other Ambulatory Visit: Payer: Self-pay

## 2020-11-10 ENCOUNTER — Ambulatory Visit (INDEPENDENT_AMBULATORY_CARE_PROVIDER_SITE_OTHER): Payer: BC Managed Care – PPO | Admitting: Pharmacist

## 2020-11-10 VITALS — BP 112/80 | Wt >= 6400 oz

## 2020-11-10 DIAGNOSIS — I5041 Acute combined systolic (congestive) and diastolic (congestive) heart failure: Secondary | ICD-10-CM

## 2020-11-10 MED ORDER — OZEMPIC (0.25 OR 0.5 MG/DOSE) 2 MG/1.5ML ~~LOC~~ SOPN: 0.5000 mg | PEN_INJECTOR | SUBCUTANEOUS | 0 refills | Status: DC

## 2020-11-10 NOTE — Progress Notes (Signed)
Patient ID: Todd Griffith                 DOB: 07-Jun-1990                    MRN: [id number]     HPI: Todd Griffith is a 30 y.o. male patient referred to pharmacy clinic by Dr. Aundra Dubin to initiate weight loss therapy with GLP1-RA. PMH is significant for obesity complicated by chronic medical conditions including asthma, OSA, HIV+, and nonischemic cardiomyopathy. Most recent BMI 62.1.  Patient referred to healthy weight and wellness, told to reestablish with bariatric surgery and referred to PharmD for [facility]. PA for [facility] was approved but due to backorder of Wegovy 0.25, 0.5 and 44m will use ozempic to start. Patient was given sample and Rx sent to pharmacy.   Patient states his weight issue really started in 2016. When he stopped play sports he really started gaining weight. States he is "lazy" and doesn't cook. Loves asian food. Tries not to eat too much fried foods.  Current weight management medications: none  Previously tried meds: none  Current meds that may affect weight: none  Baseline weight/BMI: 433/62.1  Insurance payor: [employer] authorization already approved through 05/14/21.  Diet:  -Breakfast: sausage (1 egg) egg/cheese on toast or omelette 2 egg w/ peppers, onions, spinach, sausage -Lunch: nachos, burger + fries -Dinner: fast food ([employer]) -Snacks: doesn't really snack- chips -Drinks: water, juice, lipton green tea  Exercise: trying to increase- walks  Family History:  Family History  Problem Relation Age of Onset   Hypertension Mother    Diabetes Mellitus II Father    CAD Father        Died at 474of 'CAD' while sleeping    Obesity Sister    Colon cancer Neg Hx    Esophageal cancer Neg Hx      Social History: + ETOH drinks a pint of [place]   Labs: Lab Results  Component Value Date   HGBA1C 4.8 11/18/2019    Wt Readings from Last 1 Encounters:  10/12/20 (!) 433 lb (196.4 kg)    BP Readings from Last 1  Encounters:  10/12/20 123/82   Pulse Readings from Last 1 Encounters:  10/12/20 89       Component Value Date/Time   CHOL 115 04/04/2020 1134   TRIG 161 (H) 04/04/2020 1134   HDL 25 (L) 04/04/2020 1134   CHOLHDL 4.6 04/04/2020 1134   VLDL 32 04/04/2020 1134   LDLCALC 58 04/04/2020 1134    Past Medical History:  Diagnosis Date   Asthma    Bronchitis    CHF (congestive heart failure) (HCC)    HIV (human immunodeficiency virus infection) (HCC)    Obstructive sleep apnea     Current Outpatient Medications on File Prior to Visit  Medication Sig Dispense Refill   albuterol (VENTOLIN HFA) 108 (90 Base) MCG/ACT inhaler Inhale 1-2 puffs into the lungs every 6 (six) hours as needed for wheezing or shortness of breath. 6.7 g 2   atorvastatin (LIPITOR) 20 MG tablet TAKE 1 TABLET BY MOUTH EVERY DAY 90 tablet 3   bictegravir-emtricitabine-tenofovir AF (BIKTARVY) 50-200-25 MG TABS tablet Take 1 tablet by mouth daily. 30 tablet 6   bisoprolol (ZEBETA) 10 MG tablet Take 1 tablet (10 mg total) by mouth daily. 30 tablet 11   [facility]S ASPIRIN ADULT LOW DOSE 81 MG chewable tablet CHEW 1 TABLET BY MOUTH DAILY. 30 tablet 11  dapagliflozin propanediol (FARXIGA) 10 MG TABS tablet TAKE 1 TABLET (10 MG TOTAL) BY MOUTH DAILY BEFORE BREAKFAST. 90 tablet 3   doxycycline (VIBRAMYCIN) 100 MG capsule Take 1 capsule (100 mg total) by mouth 2 (two) times daily. 20 capsule 0   fluticasone (FLONASE) 50 MCG/ACT nasal spray Place 2 sprays into both nostrils daily. 16 g 0   furosemide (LASIX) 80 MG tablet TAKE 1 TABLET BY MOUTH EVERY DAY 30 tablet 0   isosorbide-hydrALAZINE (BIDIL) 20-37.5 MG tablet Take 2 tablets by mouth 3 (three) times daily. 270 tablet 3   sacubitril-valsartan (ENTRESTO) 97-103 MG Take 1 tablet by mouth 2 (two) times daily. 180 tablet 3   spironolactone (ALDACTONE) 25 MG tablet TAKE 1 TABLET BY MOUTH EVERY DAY 30 tablet 11   No current facility-administered medications on file prior to visit.     No Known Allergies   Assessment/Plan:  1. Weight loss - Patient has not met goal of at least 5% of body weight loss with comprehensive lifestyle modifications alone in the past 3-6 months. Pharmacotherapy is appropriate to pursue as augmentation. Will start Ozempic 0.37m weekly. Confirmed patient has no personal or family history of medullary thyroid carcinoma (MTC) or Multiple Endocrine Neoplasia syndrome type 2 (MEN 2).   Advised patient on common side effects including nausea, diarrhea, dyspepsia, decreased appetite, and fatigue. Counseled patient on reducing meal size and how to titrate medication to minimize side effects. Counseled patient to call if intolerable side effects or if experiencing dehydration, abdominal pain, or dizziness. Patient will adhere to dietary modifications and will target at least 150 minutes of moderate intensity exercise weekly.   We talked a lot about diet. I gave the patient his first goal of reducing/eliminating sugary beverages and to reduce his alcohol intake. If he cannot reduce his ETOH intake to just 2 drinks per night on weekend then he should not drink ETOH at all. We talked about eating really food, only eating until he no longer full and not eating when he is not hungry.  Patient was taught the injection technique for both Ozempic and Wegovy. He did his first injection in clinic in his right abdomen. Patient was sent to lab for baseline A1C and lipids.   Since first 3 starting doses of Wegovy are on national backorder, will provide pt with a sample Ozempic pen for the first month, then send in rx for month 2 and 3, then [facility]. Dosing titration schedule noted below:   Month 1: Inject Ozempic 0.268mSQ once weekly Month 2: Inject Ozempic 0.102m7102mSQ once weekly Month 3: Inject Ozempic 1mg53m once weekly Month 4: Inject Wegovy 1.7mg 70monce weekly Month 5+: Inject Wegovy 2.4mg S102mnce weekly   Follow up in 1  month over the phone and 3 months in clinic.  Thanks,  MelissRamond Dialm.D, BCPS, CPP [place] [place] [id number]: [phone]

## 2020-11-10 NOTE — Patient Instructions (Addendum)
Goal for this month is to reduce/eliminate alcohol and sugary drinks (juice/tea)  Continue ozempic 0.25mg  every Friday for 3 more injections then increase to ozempic 0.5mg  weekly  GLP-1 Receptor Agonist Counseling Points This medication reduces your appetite and may make you feel fuller longer.  Stop eating when your body tells you that you are full. This will likely happen sooner than you are used to. Store your medication in the fridge until you are ready to use it. Inject your medication in the fatty tissue of your lower abdominal area (2 inches away from belly button) or upper outer thigh. Common side effects include: nausea, diarrhea/constipation, and heartburn, and are more likely to occur if you overeat.   Tips for living a healthier life  SUGAR  Sugar is a huge problem in the modern day diet. Sugar is a big contributor to heart disease, diabetes, high triglyceride levels, fatty liver disease and obesity. Sugar is hidden in almost all packaged foods/beverages. Added sugar is extra sugar that is added beyond what is naturally found and has no nutritional benefit for your body. The American Heart Association recommends limiting added sugars to no more than 25g for women and 36 grams for men per day. There are many names for sugar including maltose, sucrose (names ending in "ose"), high fructose corn syrup, molasses, cane sugar, corn sweetener, raw sugar, syrup, honey or fruit juice concentrate.   One of the best ways to limit your added sugars is to stop drinking sweetened beverages such as soda, sweet tea, and fruit juice. There is 65g of added sugars in one 20oz bottle of Coke! That is equal to 6 donuts.   Pay attention and read all nutrition facts labels. Below is an examples of a nutrition facts label. The #1 is showing you the total sugars where the # 2 is showing you the added sugars. This one serving has almost the max amount of added sugars per day!     EXERCISE  Exercise is  good. We've all heard that. In an ideal world, we would all have time and resources to get plenty of it. When you are active, your heart pumps more efficiently and you will feel better.  Multiple studies show that even walking regularly has benefits that include living a longer life. The American Heart Association recommends 150 minutes per week of exercise (30 minutes per day most days of the week). You can do this in any increment you wish. Nine or more 10-minute walks count. So does an hour-long exercise class. Break the time apart into what will work in your life. Some of the best things you can do include walking briskly, jogging, cycling or swimming laps. Not everyone is ready to "exercise." Sometimes we need to start with just getting active. Here are some easy ways to be more active throughout the day:  Take the stairs instead of the elevator  Go for a 10-15 minute walk during your lunch break (find a friend to make it more enjoyable)  When shopping, park at the back of the parking lot  If you take public transportation, get off one stop early and walk the extra distance  Pace around while making phone calls  Check with your doctor if you aren't sure what your limitations may be. Always remember to drink plenty of water when doing any type of exercise. Don't feel like a failure if you're not getting the 90-150 minutes per week. If you started by being a couch potato, then just  a 10-minute walk each day is a huge improvement. Start with little victories and work your way up.   HEALTHY EATING TIPS  When looking to improve your eating habits, whether to lose weight, lower blood pressure or just be healthier, it helps to know what a serving size is.   Grains 1 slice of bread,  bagel,  cup pasta or rice  Vegetables 1 cup fresh or raw vegetables,  cup cooked or canned Fruits 1 piece of medium sized fruit,  cup canned,   Meats/Proteins  cup dried       1 oz meat, 1 egg,  cup cooked beans,  nuts or seeds  Dairy        Fats Individual yogurt container, 1 cup (8oz)    1 teaspoon margarine/butter or vegetable  milk or milk alternative, 1 slice of cheese          oil; 1 tablespoon mayonnaise or salad dressing                  Plan ahead: make a menu of the meals for a week then create a grocery list to go with that menu. Consider meals that easily stretch into a night of leftovers, such as stews or casseroles. Or consider making two of your favorite meal and put one in the freezer for another night. Try a night or two each week that is "meatless" or "no cook" such as salads. When you get home from the grocery store wash and prepare your vegetables and fruits. Then when you need them they are ready to go.   Tips for going to the grocery store:  Buy store or generic brands  Check the weekly ad from your store on-line or in their in-store flyer  Look at the unit price on the shelf tag to compare/contrast the costs of different items  Buy fruits/vegetables in season  Carrots, bananas and apples are low-cost, naturally healthy items  If meats or frozen vegetables are on sale, buy some extras and put in your freezer  Limit buying prepared or "ready to eat" items, even if they are pre-made salads or fruit snacks  Do not shop when you're hungry  Foods at eye level tend to be more expensive. Look on the high and low shelves for deals.  Consider shopping at the farmer's market for fresh foods in season.  Avoid the cookie and chip aisles (these are expensive, high in calories and low in nutritional value). Shop on the outside of the grocery store.  Healthy food preparations:  If you can't get lean hamburger, be sure to drain the fat when cooking  Steam, saut (in olive oil), grill or bake foods  Experiment with different seasonings to avoid adding salt to your foods. Kosher salt, sea salt and Himalayan salt are all still salt and should be avoided. Try seasoning food with onion, garlic, thyme,  rosemary, basil ect. Onion powder or garlic powder is ok. Avoid if it says salt (ie garlic salt).        Other resources: American Heart Association - MartiniMobile.it         Go to the Healthy Living tab to get more information American Diabetes Association - www.diabetes.org         You don't have to be diabetic - check out the Food and Fitness tab

## 2020-11-11 LAB — LIPID PANEL
Chol/HDL Ratio: 4.1 ratio (ref 0.0–5.0)
Cholesterol, Total: 111 mg/dL (ref 100–199)
HDL: 27 mg/dL — ABNORMAL LOW (ref >39)
LDL Chol Calc (NIH): 50 mg/dL (ref 0–99)
Triglycerides: 209 mg/dL — ABNORMAL HIGH (ref 0–149)
VLDL Cholesterol Cal: 34 mg/dL (ref 5–40)

## 2020-11-11 LAB — LDL CHOLESTEROL, DIRECT: LDL Direct: 62 mg/dL (ref 0–99)

## 2020-11-11 LAB — HEMOGLOBIN A1C
Est. average glucose Bld gHb Est-mCnc: 103 mg/dL
Hgb A1c MFr Bld: 5.2 % (ref 4.8–5.6)

## 2020-11-15 ENCOUNTER — Encounter (HOSPITAL_COMMUNITY): Payer: Self-pay

## 2020-11-15 ENCOUNTER — Other Ambulatory Visit: Payer: Self-pay

## 2020-11-15 ENCOUNTER — Ambulatory Visit (HOSPITAL_COMMUNITY)
Admission: EM | Admit: 2020-11-15 | Discharge: 2020-11-15 | Disposition: A | Discharge status: Home / Self Care | Payer: BC Managed Care – PPO | Attending: Physician Assistant | Admitting: Physician Assistant

## 2020-11-15 DIAGNOSIS — Z202 Contact with and (suspected) exposure to infections with a predominantly sexual mode of transmission: Secondary | ICD-10-CM | POA: Diagnosis not present

## 2020-11-15 DIAGNOSIS — Z113 Encounter for screening for infections with a predominantly sexual mode of transmission: Secondary | ICD-10-CM

## 2020-11-15 MED ORDER — METRONIDAZOLE 500 MG PO TABS: 2000.0000 mg | ORAL_TABLET | Freq: Once | ORAL | 0 refills | Status: AC

## 2020-11-15 NOTE — ED Provider Notes (Signed)
[facility]    CSN: [id number] Arrival date & time: 11/15/20  1857      History   Chief Complaint Chief Complaint  Patient presents with   STD's test    HPI Todd Griffith is a 30 y.o. male.   Patient present today for STI treatment.  He reports that his sexual partner recently informed him that she has tested positive for trichomonas.  He is requesting treatment of this condition if appropriate.  He denies any symptoms including penile discharge, dysuria, urinary frequency, urinary urgency, abdominal pain.  Denies any recent antibiotic use.  He is not interested in testing for blood-borne pathogens and is just requesting STI swab today.   Past Medical History:  Diagnosis Date   Asthma    Bronchitis    CHF (congestive heart failure) (HCC)    HIV (human immunodeficiency virus infection) (HCC)    Obstructive sleep apnea     Patient Active Problem List   Diagnosis Date Noted   Healthcare maintenance 11/09/2018   HIV disease (HCC) 09/22/2018   Dilated cardiomyopathy (HCC) 09/21/2018   Acute combined systolic and diastolic heart failure (HCC)    Morbid obesity (HCC)    Volume overload 09/17/2018   Peripheral edema    Orthopnea    Cardiomegaly    Uncomplicated asthma     Past Surgical History:  Procedure Laterality Date   RIGHT/LEFT HEART CATH AND CORONARY ANGIOGRAPHY N/A 09/21/2018   Procedure: RIGHT/LEFT HEART CATH AND CORONARY ANGIOGRAPHY;  Surgeon: Marykay Lex, MD;  Location: [facility] INVASIVE CV LAB;  Service: Cardiovascular;  Laterality: N/A;       Home Medications    Prior to Admission medications   Medication Sig Start Date End Date Taking? Authorizing Provider  metroNIDAZOLE (FLAGYL) 500 MG tablet Take 4 tablets (2,000 mg total) by mouth once for 1 dose. 11/15/20 11/15/20 Yes Hero Kulish K, PA-C  albuterol (VENTOLIN HFA) 108 (90 Base) MCG/ACT inhaler Inhale 1-2 puffs into the lungs every 6 (six) hours as needed for wheezing or shortness  of breath. 05/11/19   Laurey Morale, MD  atorvastatin (LIPITOR) 20 MG tablet TAKE 1 TABLET BY MOUTH EVERY DAY 08/30/20   Laurey Morale, MD  bictegravir-emtricitabine-tenofovir AF (BIKTARVY) 50-200-25 MG TABS tablet Take 1 tablet by mouth daily. 10/12/20   Veryl Speak, FNP  bisoprolol (ZEBETA) 10 MG tablet Take 1 tablet (10 mg total) by mouth daily. 11/15/19 11/14/20  Barbette Merino, NP  [facility] ASPIRIN ADULT LOW DOSE 81 MG chewable tablet CHEW 1 TABLET BY MOUTH DAILY. 06/08/20   Robbie Lis M, PA-C  dapagliflozin propanediol (FARXIGA) 10 MG TABS tablet TAKE 1 TABLET (10 MG TOTAL) BY MOUTH DAILY BEFORE BREAKFAST. 04/03/20   Robbie Lis M, PA-C  doxycycline (VIBRAMYCIN) 100 MG capsule Take 1 capsule (100 mg total) by mouth 2 (two) times daily. 09/01/20   Rushie Chestnut, PA-C  fluticasone (FLONASE) 50 MCG/ACT nasal spray Place 2 sprays into both nostrils daily. 09/16/19   Sudie Grumbling, NP  furosemide (LASIX) 80 MG tablet TAKE 1 TABLET BY MOUTH EVERY DAY 10/05/20   Robbie Lis M, PA-C  isosorbide-hydrALAZINE (BIDIL) 20-37.5 MG tablet Take 2 tablets by mouth 3 (three) times daily. 08/21/20   Laurey Morale, MD  sacubitril-valsartan (ENTRESTO) 97-103 MG Take 1 tablet by mouth 2 (two) times daily. 12/15/19   Laurey Morale, MD  Semaglutide,0.25 or 0.5MG /DOS, (OZEMPIC, 0.25 OR 0.5 MG/DOSE,) 2 MG/1.5ML SOPN Inject 0.5 mg into the skin  once a week. 11/10/20   Laurey Morale, MD  spironolactone (ALDACTONE) 25 MG tablet TAKE 1 TABLET BY MOUTH EVERY DAY 10/18/20   Laurey Morale, MD    Family History Family History  Problem Relation Age of Onset   Hypertension Mother    Diabetes Mellitus II Father    CAD Father        Died at 83 of 'CAD' while sleeping    Obesity Sister    Colon cancer Neg Hx    Esophageal cancer Neg Hx     Social History Social History   Tobacco Use   Smoking status: Former    Types: Cigarettes    Quit date: 05/31/2018    Years since quitting: 2.4    Smokeless tobacco: Never  Vaping Use   Vaping Use: Former  Substance Use Topics   Alcohol use: Yes    Alcohol/week: 3.0 standard drinks    Types: 3 Cans of beer per week    Comment: socially    Drug use: Not Currently     Allergies   Patient has no known allergies.   Review of Systems Review of Systems  Constitutional:  Negative for activity change, appetite change, fatigue and fever.  Respiratory:  Negative for cough and shortness of breath.   Cardiovascular:  Negative for chest pain.  Gastrointestinal:  Negative for abdominal pain, diarrhea, nausea and vomiting.  Genitourinary:  Negative for dysuria, flank pain, frequency, hematuria and urgency.    Physical Exam Triage Vital Signs ED Triage Vitals  Enc Vitals Group     BP 11/15/20 1954 130/90     Pulse Rate 11/15/20 1954 83     Resp 11/15/20 1954 (!) 25     Temp 11/15/20 1954 98.1 F (36.7 C)     Temp Source 11/15/20 1954 Oral     SpO2 11/15/20 1954 96 %     Weight --      Height --      Head Circumference --      Peak Flow --      Pain Score 11/15/20 1958 0     Pain Loc --      Pain Edu? --      Excl. in GC? --    No data found.  Updated Vital Signs BP 130/90 (BP Location: Right Wrist)   Pulse 83   Temp 98.1 F (36.7 C) (Oral)   Resp (!) 25   SpO2 96%   Visual Acuity Right Eye Distance:   Left Eye Distance:   Bilateral Distance:    Right Eye Near:   Left Eye Near:    Bilateral Near:     Physical Exam Vitals reviewed.  Constitutional:      General: He is awake.     Appearance: Normal appearance. He is normal weight. He is not ill-appearing.     Comments: Very pleasant male appears stated age in no acute distress  HENT:     Head: Normocephalic and atraumatic.     Mouth/Throat:     Pharynx: No oropharyngeal exudate, posterior oropharyngeal erythema or uvula swelling.  Cardiovascular:     Rate and Rhythm: Normal rate and regular rhythm.     Heart sounds: Normal heart sounds, S1 normal and S2  normal. No murmur heard. Pulmonary:     Effort: Pulmonary effort is normal.     Breath sounds: Normal breath sounds. No stridor. No wheezing, rhonchi or rales.     Comments: Clear to auscultation bilaterally Abdominal:  General: Bowel sounds are normal.     Palpations: Abdomen is soft.     Tenderness: There is no abdominal tenderness. There is no right CVA tenderness, left CVA tenderness, guarding or rebound.     Comments: Benign abdominal exam  Neurological:     Mental Status: He is alert.  Psychiatric:        Behavior: Behavior is cooperative.     UC Treatments / Results  Labs (all labs ordered are listed, but only abnormal results are displayed) Labs Reviewed  CYTOLOGY, (ORAL, ANAL, URETHRAL) ANCILLARY ONLY    EKG   Radiology No results found.  Procedures Procedures (including critical care time)  Medications Ordered in UC Medications - No data to display  Initial Impression / Assessment and Plan / UC Course  I have reviewed the triage vital signs and the nursing notes.  Pertinent labs & imaging results that were available during my care of the patient were reviewed by me and considered in my medical decision making (see chart for details).      Patient empirically treated for trichomonas given known contact with 2 g of metronidazole.  Discussed that he is not to drink alcohol while taking this medication and for 72 hours after completing course due to risk of Antabuse side effects.  Discussed that he is to abstain from sexual contact until results are obtained.  We will contact him if additional treatment is required.  Discussed the importance of having all partners tested and treated as well as it can be reexposed and get trichomonas again.  Discussed alarm symptoms that warrant emergent evaluation.  Strict return precaution given to which patient expressed understanding.  Final Clinical Impressions(s) / UC Diagnoses   Final diagnoses:  Trichomonas exposure   Routine screening for STI (sexually transmitted infection)     Discharge Instructions      We will contact you with your STI results if we need to arrange additional treatment.  I have called you in the treatment for trichomonas (metronidazole 2 g once).  Make sure you abstain from intercourse until you get results and all partners have been tested and treated as well.     ED Prescriptions     Medication Sig Dispense Auth. Provider   metroNIDAZOLE (FLAGYL) 500 MG tablet Take 4 tablets (2,000 mg total) by mouth once for 1 dose. 4 tablet Alanee Ting, Noberto Retort, PA-C      PDMP not reviewed this encounter.   Jeani Hawking, PA-C 11/15/20 2025

## 2020-11-15 NOTE — ED Triage Notes (Signed)
Pt presents fr STD's test.

## 2020-11-15 NOTE — Discharge Instructions (Addendum)
We will contact you with your STI results if we need to arrange additional treatment.  I have called you in the treatment for trichomonas (metronidazole 2 g once).  Make sure you abstain from intercourse until you get results and all partners have been tested and treated as well.

## 2020-11-16 LAB — CYTOLOGY, (ORAL, ANAL, URETHRAL) ANCILLARY ONLY
Chlamydia: NEGATIVE
Comment: NEGATIVE
Comment: NEGATIVE
Comment: NORMAL
Neisseria Gonorrhea: NEGATIVE
Trichomonas: POSITIVE — AB

## 2020-11-22 ENCOUNTER — Other Ambulatory Visit: Payer: Self-pay | Admitting: Nurse Practitioner

## 2020-12-05 ENCOUNTER — Telehealth: Payer: Self-pay | Admitting: Pharmacist

## 2020-12-05 NOTE — Telephone Encounter (Signed)
Called pt to see how he was doing on ozempic 0.25mg  weekly. He is due to increase to ozempic 0.5mg  on Friday. There is still supply issues with both ozempic and wegovy. Will need to discuss with patient the option of switching to Saxenda - titration to the max dose of 3mg  and the  switching to [facility] 1.7mg  if he prefers weekly injections vs daily. LVM for patient to call back to discuss.

## 2020-12-06 ENCOUNTER — Ambulatory Visit: Payer: Self-pay | Admitting: Nurse Practitioner

## 2020-12-08 ENCOUNTER — Other Ambulatory Visit (HOSPITAL_COMMUNITY): Payer: Self-pay | Admitting: Cardiology

## 2020-12-29 ENCOUNTER — Other Ambulatory Visit (HOSPITAL_COMMUNITY): Payer: Self-pay | Admitting: Cardiology

## 2021-01-06 IMAGING — CR DG CHEST 2V
2 series · 3 of 3 positions shown · non-contrast
Comparison: September 17, 2019

CLINICAL DATA: Cough with chest pain and difficulty breathing

EXAM:
CHEST - 2 VIEW

[chest pa]
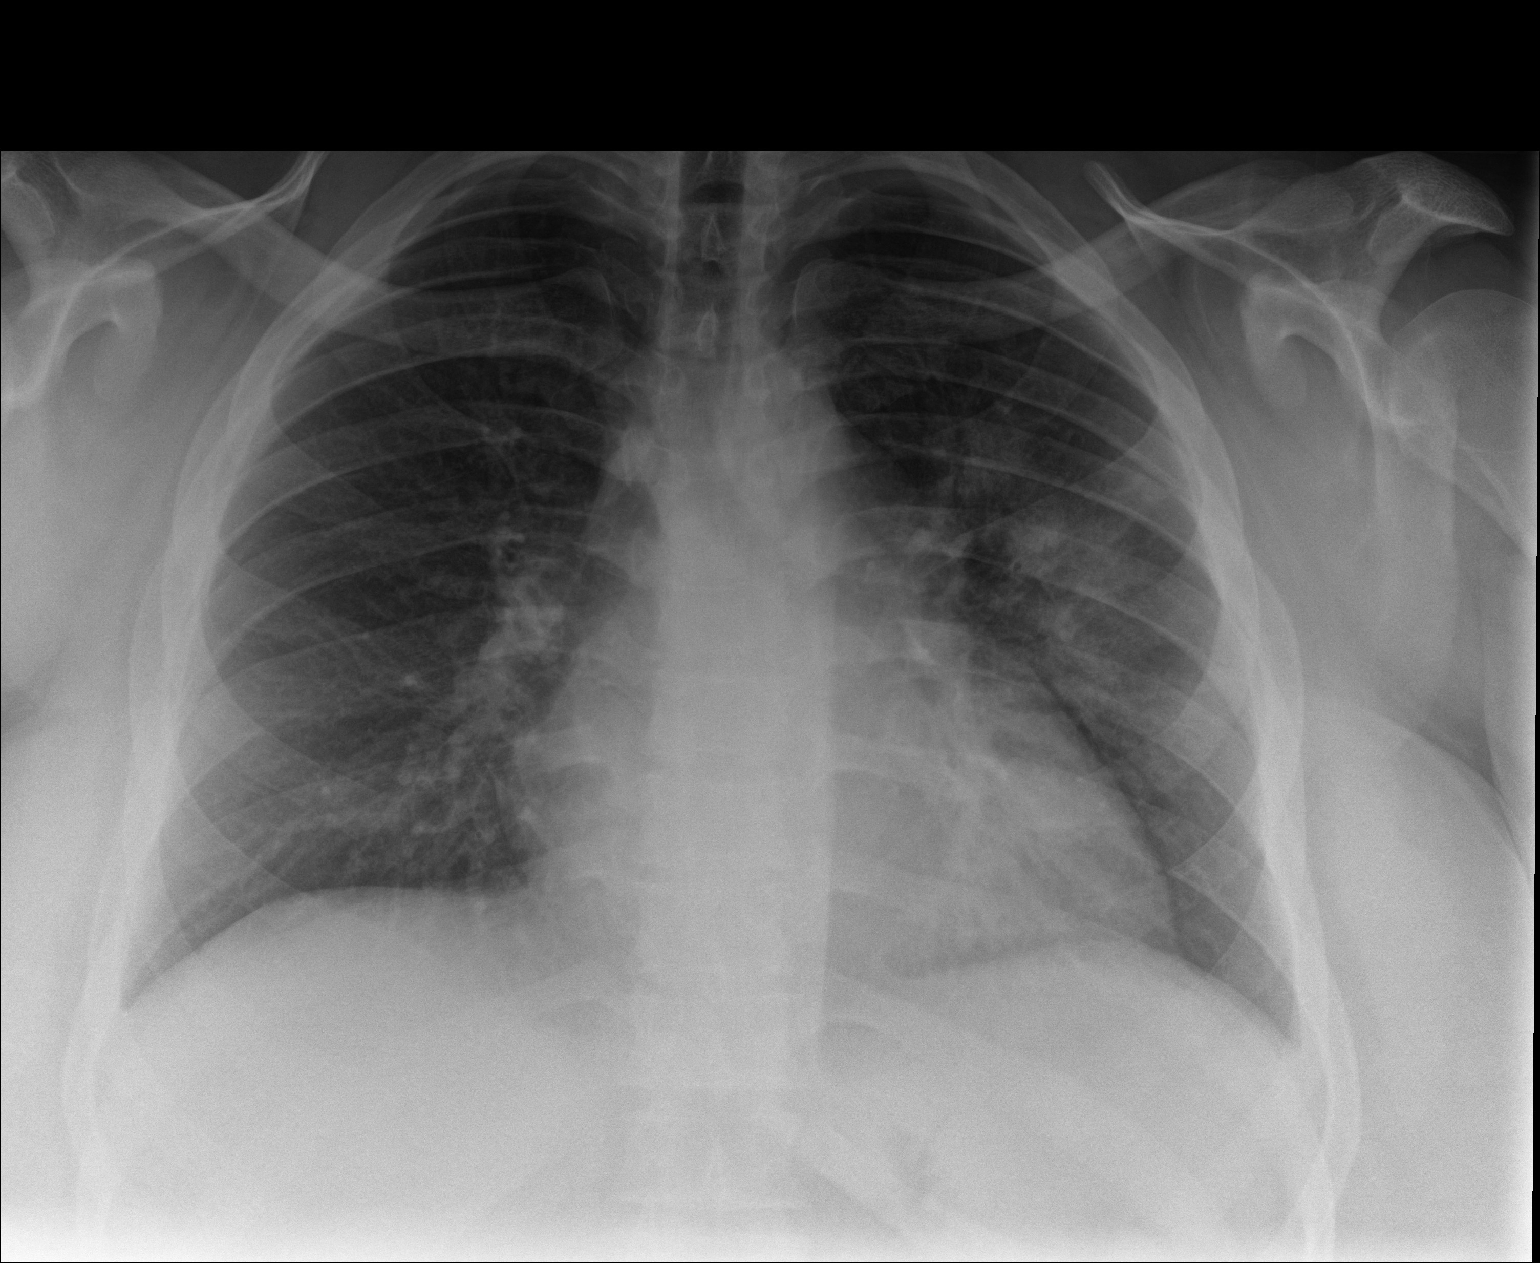

[Series 2: chest lat · 0.14mm/px · 2 of 2 slices shown]
[im 1/2]
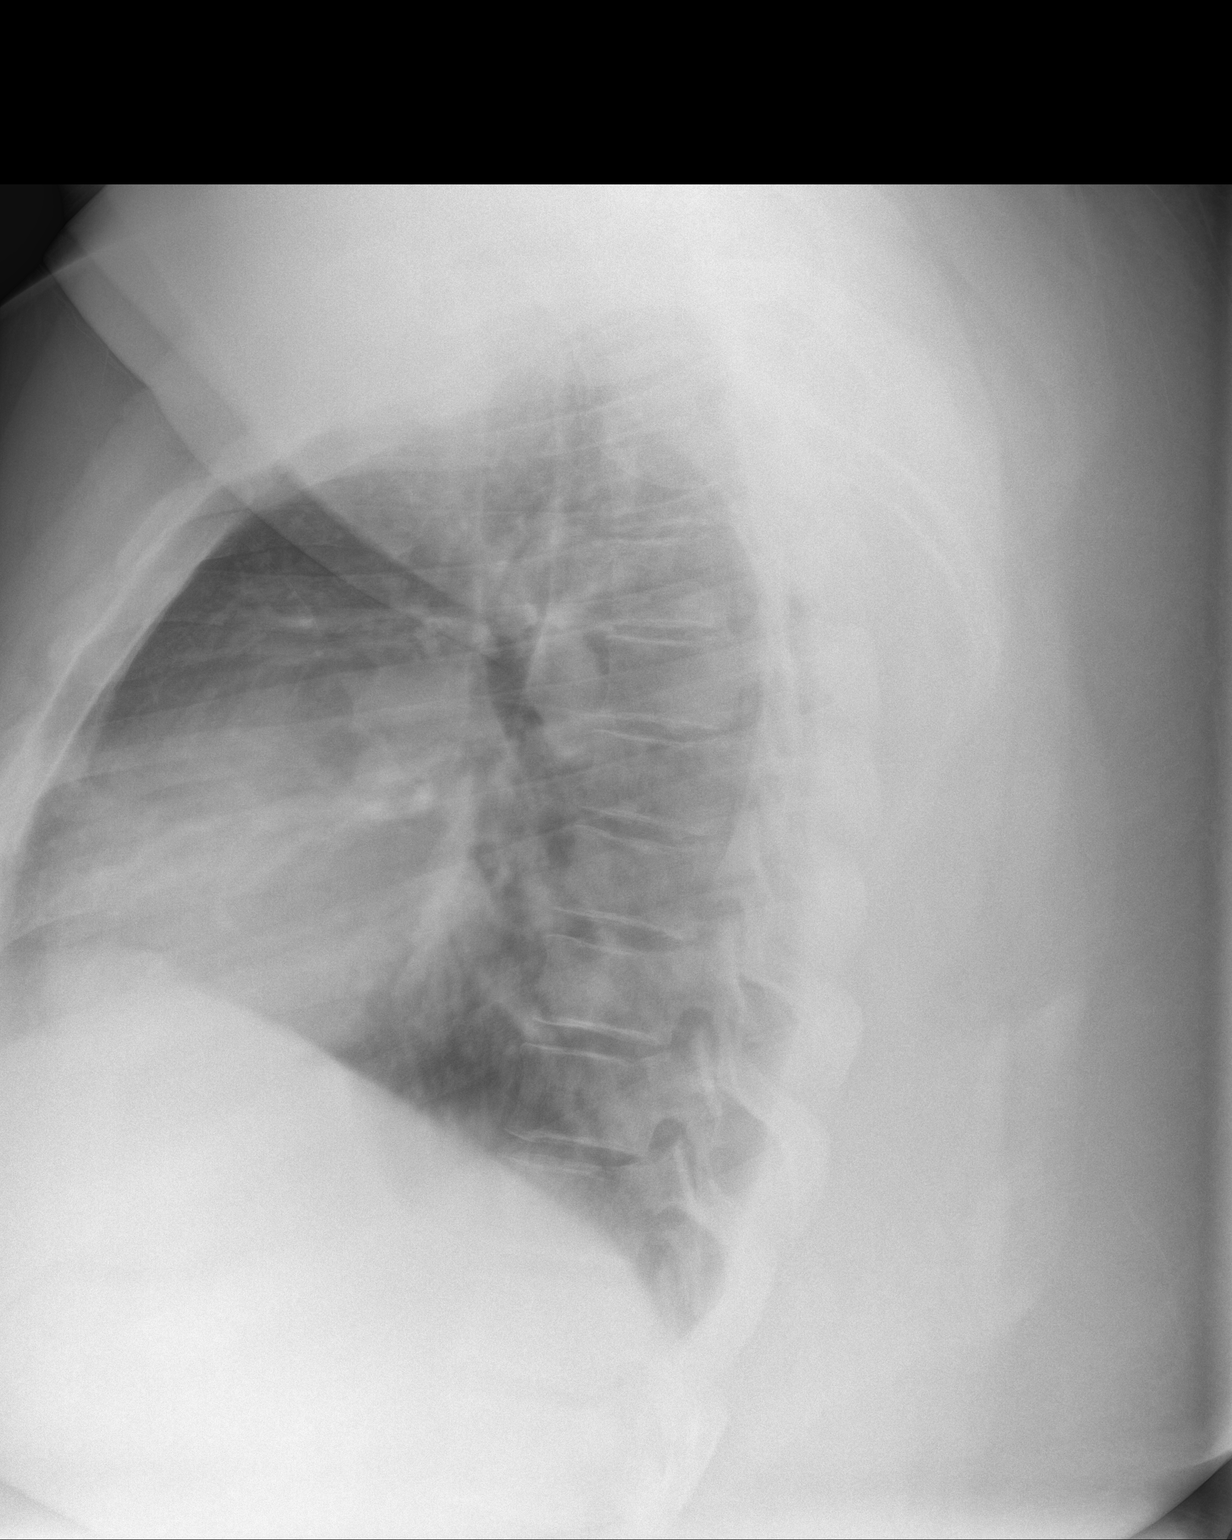
[im 2/2]
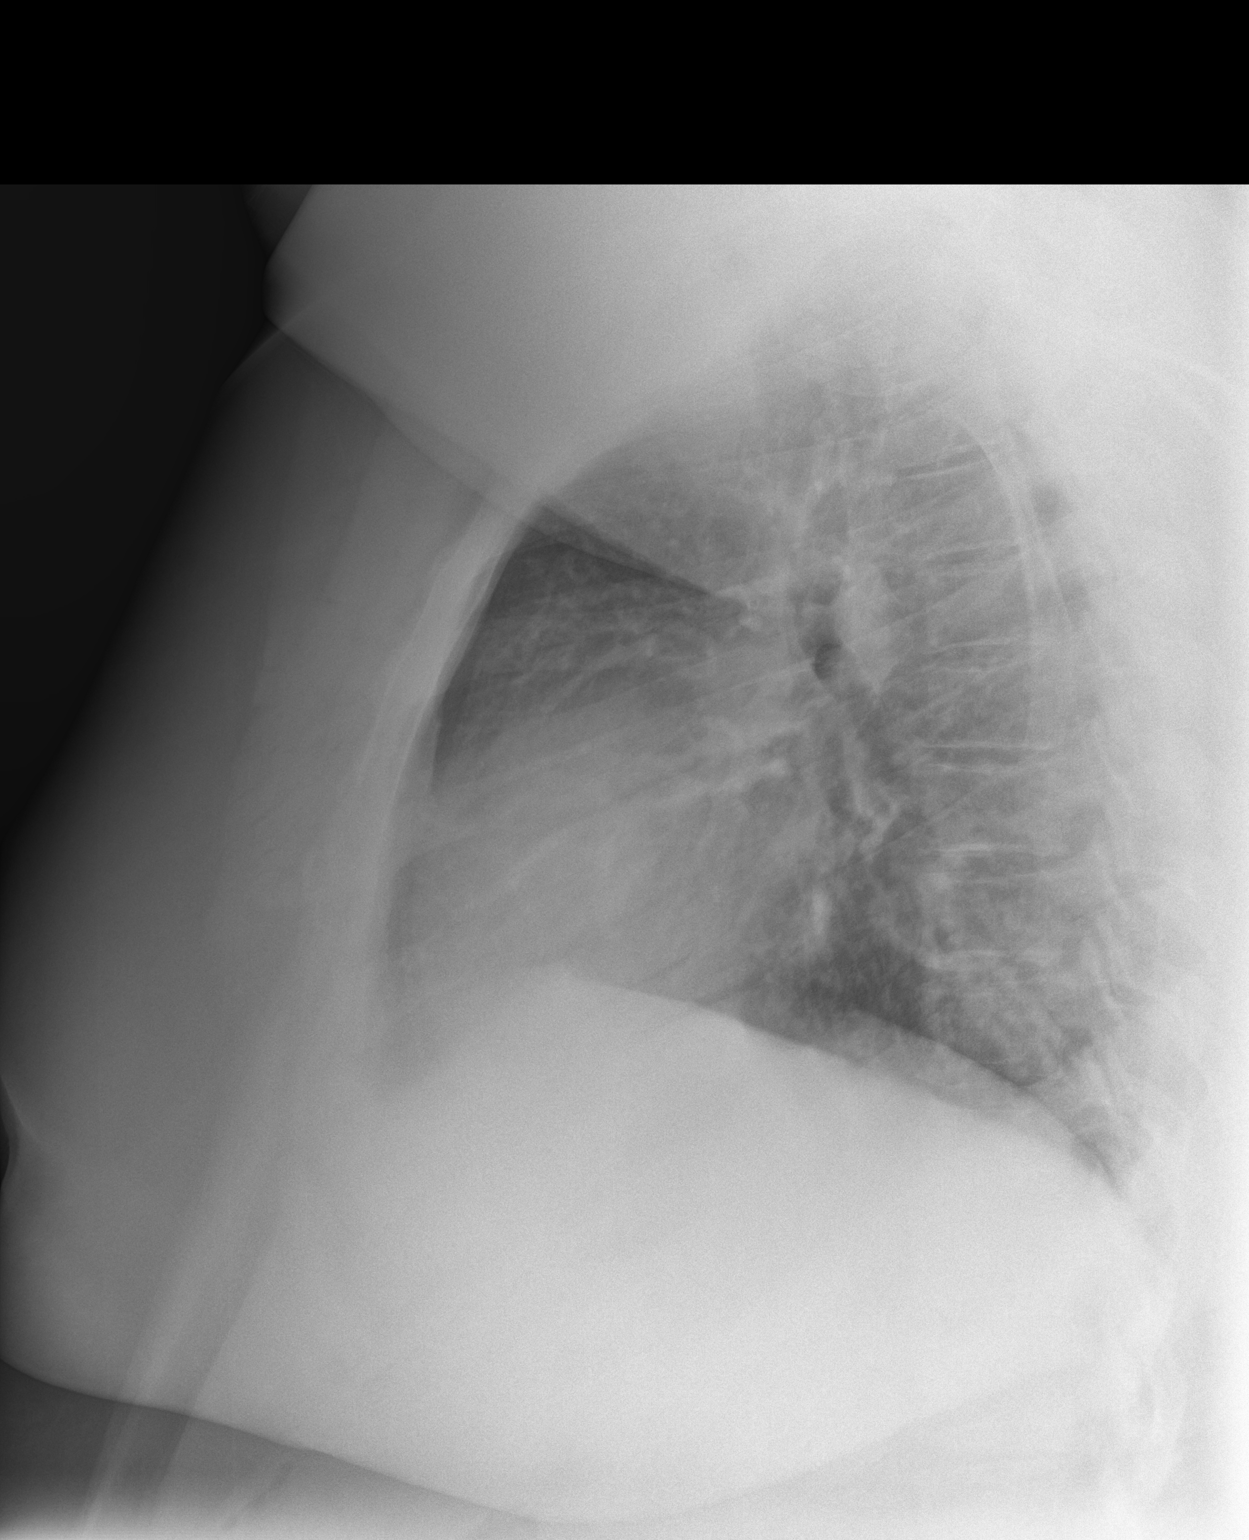

[3 of 3 positions shown; findings below may reference images not displayed]

FINDINGS: There is airspace opacity in the left perihilar region. The lungs
elsewhere are clear. The heart size and pulmonary vascularity are
normal. No adenopathy. No bone lesions.
IMPRESSION: Airspace opacity left perihilar region consistent with pneumonia.
Lungs otherwise clear. Cardiac silhouette within normal limits. No
adenopathy appreciable.

These results will be called to the ordering clinician or
representative by the Radiologist Assistant, and communication
documented in the PACS or [REDACTED].

## 2021-02-02 ENCOUNTER — Ambulatory Visit: Payer: BC Managed Care – PPO

## 2021-02-02 NOTE — Progress Notes (Deleted)
Patient ID: Todd Griffith                 DOB: [birth year]                    MRN: [id number]     HPI: Todd Griffith is a 30 y.o. male patient referred to pharmacy clinic by Dr. Aundra Dubin to initiate weight loss therapy with GLP1-RA. PMH is significant for obesity complicated by chronic medical conditions including asthma, OSA, HIV+, and nonischemic cardiomyopathy. Most recent BMI 62.1.  Patient referred to healthy weight and wellness, told to reestablish with bariatric surgery and referred to PharmD for [facility]. PA for [facility] was approved but due to backorder of Wegovy 0.25, 0.5 and 18m will use ozempic to start. Patient was given sample and Rx sent to pharmacy.   Patient states his weight issue really started in 2016. When he stopped play sports he really started gaining weight. States he is "lazy" and doesn't cook. Loves asian food. Tries not to eat too much fried foods.  *** Follow-up visit  Assess % weight loss Assess adverse effects Missed doses   Current weight management medications: none  Previously tried meds: none  Current meds that may affect weight: none  Baseline weight/BMI: 433/62.1  Insurance payor: [employer] authorization already approved through 05/14/21.  Diet:  -Breakfast: sausage (1 egg) egg/cheese on toast or omelette 2 egg w/ peppers, onions, spinach, sausage -Lunch: nachos, burger + fries -Dinner: fast food ([employer]) -Snacks: doesn't really snack- chips -Drinks: water, juice, lipton green tea  Exercise: trying to increase- walks  Family History:  Family History  Problem Relation Age of Onset   Hypertension Mother    Diabetes Mellitus II Father    CAD Father        Died at 475of 'CAD' while sleeping    Obesity Sister    Colon cancer Neg Hx    Esophageal cancer Neg Hx      Social History: + ETOH drinks a pint of [place]   Labs: Lab Results  Component Value Date   HGBA1C 5.2 11/10/2020    Wt Readings from Last  1 Encounters:  11/10/20 (!) 433 lb (196.4 kg)    BP Readings from Last 1 Encounters:  11/15/20 130/90   Pulse Readings from Last 1 Encounters:  11/15/20 83       Component Value Date/Time   CHOL 111 11/10/2020 1200   TRIG 209 (H) 11/10/2020 1200   HDL 27 (L) 11/10/2020 1200   CHOLHDL 4.1 11/10/2020 1200   CHOLHDL 4.6 04/04/2020 1134   VLDL 32 04/04/2020 1134   LDLCALC 50 11/10/2020 1200   LDLDIRECT 62 11/10/2020 1200    Past Medical History:  Diagnosis Date   Asthma    Bronchitis    CHF (congestive heart failure) (HCC)    HIV (human immunodeficiency virus infection) (HCC)    Obstructive sleep apnea     Current Outpatient Medications on File Prior to Visit  Medication Sig Dispense Refill   albuterol (VENTOLIN HFA) 108 (90 Base) MCG/ACT inhaler Inhale 1-2 puffs into the lungs every 6 (six) hours as needed for wheezing or shortness of breath. 6.7 g 2   atorvastatin (LIPITOR) 20 MG tablet TAKE 1 TABLET BY MOUTH EVERY DAY 90 tablet 3   bictegravir-emtricitabine-tenofovir AF (BIKTARVY) 50-200-25 MG TABS tablet Take 1 tablet by mouth daily. 30 tablet 6   bisoprolol (ZEBETA) 10 MG tablet TAKE 1 TABLET BY MOUTH EVERY DAY  90 tablet 3   [facility]S ASPIRIN ADULT LOW DOSE 81 MG chewable tablet CHEW 1 TABLET BY MOUTH DAILY. 30 tablet 11   dapagliflozin propanediol (FARXIGA) 10 MG TABS tablet TAKE 1 TABLET (10 MG TOTAL) BY MOUTH DAILY BEFORE BREAKFAST. 90 tablet 3   doxycycline (VIBRAMYCIN) 100 MG capsule Take 1 capsule (100 mg total) by mouth 2 (two) times daily. 20 capsule 0   ENTRESTO 97-103 MG TAKE 1 TABLET BY MOUTH TWICE A DAY 180 tablet 3   fluticasone (FLONASE) 50 MCG/ACT nasal spray Place 2 sprays into both nostrils daily. 16 g 0   furosemide (LASIX) 80 MG tablet TAKE 1 TABLET BY MOUTH EVERY DAY 90 tablet 3   isosorbide-hydrALAZINE (BIDIL) 20-37.5 MG tablet Take 2 tablets by mouth 3 (three) times daily. 270 tablet 3   Semaglutide,0.25 or 0.5MG/DOS, (OZEMPIC, 0.25 OR 0.5 MG/DOSE,) 2  MG/1.5ML SOPN Inject 0.5 mg into the skin once a week. 1.5 mL 0   spironolactone (ALDACTONE) 25 MG tablet TAKE 1 TABLET BY MOUTH EVERY DAY 30 tablet 11   No current facility-administered medications on file prior to visit.    No Known Allergies   Assessment/Plan:  1. Weight loss - Patient has not met goal of at least 5% of body weight loss with comprehensive lifestyle modifications alone in the past 3-6 months. Pharmacotherapy is appropriate to pursue as augmentation. Will start Ozempic 0.58m weekly. Confirmed patient has no personal or family history of medullary thyroid carcinoma (MTC) or Multiple Endocrine Neoplasia syndrome type 2 (MEN 2).   Advised patient on common side effects including nausea, diarrhea, dyspepsia, decreased appetite, and fatigue. Counseled patient on reducing meal size and how to titrate medication to minimize side effects. Counseled patient to call if intolerable side effects or if experiencing dehydration, abdominal pain, or dizziness. Patient will adhere to dietary modifications and will target at least 150 minutes of moderate intensity exercise weekly.   We talked a lot about diet. I gave the patient his first goal of reducing/eliminating sugary beverages and to reduce his alcohol intake. If he cannot reduce his ETOH intake to just 2 drinks per night on weekend then he should not drink ETOH at all. We talked about eating really food, only eating until he no longer full and not eating when he is not hungry.  Patient was taught the injection technique for both Ozempic and Wegovy. He did his first injection in clinic in his right abdomen. Patient was sent to lab for baseline A1C and lipids.   Since first 3 starting doses of Wegovy are on national backorder, will provide pt with a sample Ozempic pen for the first month, then send in rx for month 2 and 3, then [facility] dosing and after. Dosing titration schedule noted below:   Month 1: Inject  Ozempic 0.254mSQ once weekly Month 2: Inject Ozempic 0.29m74mSQ once weekly Month 3: Inject Ozempic 1mg11m once weekly Month 4: Inject Wegovy 1.7mg 12monce weekly Month 5+: Inject Wegovy 2.4mg S77mnce weekly   Follow up in 1 month over the phone and 3 months in clinic.  Thanks,  MelissRamond Dialm.D, BCPS, CPP [facility] [id number] [id number]: [phone]

## 2021-02-16 ENCOUNTER — Emergency Department (HOSPITAL_COMMUNITY): Payer: BC Managed Care – PPO

## 2021-02-16 ENCOUNTER — Emergency Department (HOSPITAL_COMMUNITY)
Admission: EM | Admit: 2021-02-16 | Discharge: 2021-02-16 | Disposition: A | Discharge status: Home / Self Care | Payer: BC Managed Care – PPO | Attending: Emergency Medicine | Admitting: Emergency Medicine

## 2021-02-16 ENCOUNTER — Encounter (HOSPITAL_COMMUNITY): Payer: Self-pay

## 2021-02-16 ENCOUNTER — Emergency Department (HOSPITAL_COMMUNITY)
Admission: EM | Admit: 2021-02-16 | Discharge: 2021-02-16 | Discharge status: Against Medical Advice | Payer: BC Managed Care – PPO | Source: Home / Self Care

## 2021-02-16 ENCOUNTER — Other Ambulatory Visit: Payer: Self-pay

## 2021-02-16 DIAGNOSIS — R197 Diarrhea, unspecified: Secondary | ICD-10-CM | POA: Diagnosis not present

## 2021-02-16 DIAGNOSIS — Z21 Asymptomatic human immunodeficiency virus [HIV] infection status: Secondary | ICD-10-CM | POA: Insufficient documentation

## 2021-02-16 DIAGNOSIS — Z7982 Long term (current) use of aspirin: Secondary | ICD-10-CM | POA: Diagnosis not present

## 2021-02-16 DIAGNOSIS — R059 Cough, unspecified: Secondary | ICD-10-CM | POA: Diagnosis not present

## 2021-02-16 DIAGNOSIS — J101 Influenza due to other identified influenza virus with other respiratory manifestations: Secondary | ICD-10-CM | POA: Diagnosis not present

## 2021-02-16 DIAGNOSIS — Z7951 Long term (current) use of inhaled steroids: Secondary | ICD-10-CM | POA: Diagnosis not present

## 2021-02-16 DIAGNOSIS — J9811 Atelectasis: Secondary | ICD-10-CM | POA: Diagnosis not present

## 2021-02-16 DIAGNOSIS — Z955 Presence of coronary angioplasty implant and graft: Secondary | ICD-10-CM | POA: Diagnosis not present

## 2021-02-16 DIAGNOSIS — Z87891 Personal history of nicotine dependence: Secondary | ICD-10-CM | POA: Diagnosis not present

## 2021-02-16 DIAGNOSIS — Z20822 Contact with and (suspected) exposure to covid-19: Secondary | ICD-10-CM | POA: Insufficient documentation

## 2021-02-16 DIAGNOSIS — R000 Tachycardia, unspecified: Secondary | ICD-10-CM | POA: Diagnosis not present

## 2021-02-16 DIAGNOSIS — I5041 Acute combined systolic (congestive) and diastolic (congestive) heart failure: Secondary | ICD-10-CM | POA: Diagnosis not present

## 2021-02-16 DIAGNOSIS — Z79899 Other long term (current) drug therapy: Secondary | ICD-10-CM | POA: Diagnosis not present

## 2021-02-16 DIAGNOSIS — R509 Fever, unspecified: Secondary | ICD-10-CM | POA: Diagnosis not present

## 2021-02-16 DIAGNOSIS — J45909 Unspecified asthma, uncomplicated: Secondary | ICD-10-CM | POA: Diagnosis not present

## 2021-02-16 DIAGNOSIS — Z28310 Unvaccinated for covid-19: Secondary | ICD-10-CM | POA: Diagnosis not present

## 2021-02-16 LAB — COMPREHENSIVE METABOLIC PANEL
ALT: 26 U/L (ref 0–44)
AST: 29 U/L (ref 15–41)
Albumin: 3.9 g/dL (ref 3.5–5.0)
Alkaline Phosphatase: 82 U/L (ref 38–126)
Anion gap: 7 (ref 5–15)
BUN: 17 mg/dL (ref 6–20)
CO2: 27 mmol/L (ref 22–32)
Calcium: 8.7 mg/dL — ABNORMAL LOW (ref 8.9–10.3)
Chloride: 99 mmol/L (ref 98–111)
Creatinine, Ser: 1.55 mg/dL — ABNORMAL HIGH (ref 0.61–1.24)
GFR, Estimated: >60 mL/min (ref >60)
Glucose, Bld: 94 mg/dL (ref 70–99)
Potassium: 3.2 mmol/L — ABNORMAL LOW (ref 3.5–5.1)
Sodium: 133 mmol/L — ABNORMAL LOW (ref 135–145)
Total Bilirubin: 1.1 mg/dL (ref 0.3–1.2)
Total Protein: 7.4 g/dL (ref 6.5–8.1)

## 2021-02-16 LAB — CBC WITH DIFFERENTIAL/PLATELET
Abs Immature Granulocytes: 0.07 10*3/uL (ref 0.00–0.07)
Basophils Absolute: 0 10*3/uL (ref 0.0–0.1)
Basophils Relative: 0 %
Eosinophils Absolute: 0.4 10*3/uL (ref 0.0–0.5)
Eosinophils Relative: 5 %
HCT: 37.8 % — ABNORMAL LOW (ref 39.0–52.0)
Hemoglobin: 12.6 g/dL — ABNORMAL LOW (ref 13.0–17.0)
Immature Granulocytes: 1 %
Lymphocytes Relative: 23 %
Lymphs Abs: 1.6 10*3/uL (ref 0.7–4.0)
MCH: 32.6 pg (ref 26.0–34.0)
MCHC: 33.3 g/dL (ref 30.0–36.0)
MCV: 97.7 fL (ref 80.0–100.0)
Monocytes Absolute: 0.8 10*3/uL (ref 0.1–1.0)
Monocytes Relative: 12 %
Neutro Abs: 4.1 10*3/uL (ref 1.7–7.7)
Neutrophils Relative %: 59 %
Platelets: 209 10*3/uL (ref 150–400)
RBC: 3.87 MIL/uL — ABNORMAL LOW (ref 4.22–5.81)
RDW: 13.4 % (ref 11.5–15.5)
WBC: 7 10*3/uL (ref 4.0–10.5)
nRBC: 0 % (ref 0.0–0.2)

## 2021-02-16 LAB — RESP PANEL BY RT-PCR (FLU A&B, COVID) ARPGX2
Influenza A by PCR: POSITIVE — AB
Influenza B by PCR: NEGATIVE
SARS Coronavirus 2 by RT PCR: NEGATIVE

## 2021-02-16 MED ORDER — ACETAMINOPHEN 500 MG PO TABS
1000.0000 mg | ORAL_TABLET | Freq: Once | ORAL | Status: AC
  Administered 2021-02-16: 1000 mg via ORAL
  Filled 2021-02-16: qty 2

## 2021-02-16 MED ORDER — PREDNISONE 10 MG PO TABS: 40.0000 mg | ORAL_TABLET | Freq: Every day | ORAL | 0 refills | Status: DC

## 2021-02-16 MED ORDER — OSELTAMIVIR PHOSPHATE 75 MG PO CAPS
75.0000 mg | ORAL_CAPSULE | Freq: Once | ORAL | Status: AC
  Administered 2021-02-16: 75 mg via ORAL
  Filled 2021-02-16: qty 1

## 2021-02-16 MED ORDER — OSELTAMIVIR PHOSPHATE 75 MG PO CAPS: 75.0000 mg | ORAL_CAPSULE | Freq: Two times a day (BID) | ORAL | 0 refills | Status: DC

## 2021-02-16 MED ORDER — METHYLPREDNISOLONE SODIUM SUCC 125 MG IJ SOLR
125.0000 mg | Freq: Once | INTRAMUSCULAR | Status: AC
  Administered 2021-02-16: 125 mg via INTRAVENOUS
  Filled 2021-02-16: qty 2

## 2021-02-16 MED ORDER — DOXYCYCLINE HYCLATE 100 MG PO CAPS: 100.0000 mg | ORAL_CAPSULE | Freq: Two times a day (BID) | ORAL | 0 refills | Status: DC

## 2021-02-16 MED ORDER — IPRATROPIUM-ALBUTEROL 0.5-2.5 (3) MG/3ML IN SOLN
3.0000 mL | Freq: Once | RESPIRATORY_TRACT | Status: AC
  Administered 2021-02-16: 3 mL via RESPIRATORY_TRACT
  Filled 2021-02-16: qty 3

## 2021-02-16 NOTE — ED Provider Notes (Signed)
Emergency Medicine Provider Triage Evaluation Note  [facility] , a 30 y.o. male  was evaluated in triage.  Pt complains of fevers, cough, body aches, and headache for 2 days.  He reports compliance with his medications.  He did not get a flu shot.  He denies having any prior COVID vaccines.  Reports compliance with his medications, no attempts at treatment today prior to arrival.  Denies any Tylenol so far today.  Review of Systems  Positive:   Fever, cough, body aches, headache Negative: Syncope  Physical Exam  BP 134/73 (BP Location: Left Arm)   Pulse (!) 110   Temp (!) 103 F (39.4 C) (Oral)   Resp (!) 28   SpO2 96%  Gen:   Awake, no distress   Resp:  Increased rate, normal effort.  MSK:   Moves extremities without difficulty  Other:  Normal speech  Medical Decision Making  Medically screening exam initiated at 3:01 PM.  Appropriate orders placed.  Noell Marquel Gulick was informed that the remainder of the evaluation will be completed by another provider, this initial triage assessment does not replace that evaluation, and the importance of remaining in the ED until their evaluation is complete.  Patient is a 30 year old with 2 days of body aches, cough, headache.  He is not vaccinated against COVID or flu.  While he does have a history of HIV, his most recent counts were undetected and he reports compliance with his medications. Given his fever and flulike symptoms will obtain flu and COVID test and treat with Tylenol.  Note: Portions of this report may have been transcribed using voice recognition software. Every effort was made to ensure accuracy; however, inadvertent computerized transcription errors may be present    Cristina Gong, PA-C 02/16/21 1504    Ernie Avena, MD 02/16/21 1540

## 2021-02-16 NOTE — ED Provider Notes (Signed)
Pt seen by Dr Karene Fry.  Please see his note.  Plan to follow up on labs, outpatient treatment for influenza.  6:11 PM patient states he is feeling much better.  He is breathing easily.  Feels ready for discharge.  Will provide patient a work note   Todd Dibbles, MD 02/16/21 [phone]

## 2021-02-16 NOTE — ED Triage Notes (Signed)
Patient c/o fever, chills, body aches, headache, a productive cough with yellow sputum x 2 days. T.103.0 orally in triage.

## 2021-02-16 NOTE — ED Provider Notes (Signed)
[facility]-EMERGENCY DEPT Provider Note   CSN: [id number] Arrival date & time: 02/16/21  1445     History Chief Complaint  Patient presents with   Fever   Cough   Generalized Body Aches   Headache   Diarrhea    Todd Griffith is a 30 y.o. male.   Fever Associated symptoms: congestion, cough, diarrhea, headaches and rhinorrhea   Associated symptoms: no chest pain   Cough Associated symptoms: fever, headaches, rhinorrhea, shortness of breath and wheezing   Associated symptoms: no chest pain   Headache Associated symptoms: congestion, cough, diarrhea and fever   Associated symptoms: no abdominal pain   Diarrhea Associated symptoms: fever and headaches   Associated symptoms: no abdominal pain    30 year old male with a history of asthma, CHF, OSA, HIV (last CD4 count greater than 1200 in July 2022) presenting to the Emergency Department with roughly 2 days of cough productive of yellow sputum, fever, headache, muscle aches and chills.  On arrival, the patient was febrile to 103 orally. States that he is compliant with his HIV medications.  He did not get a COVID-vaccine or flu shot.   Past Medical History:  Diagnosis Date   Asthma    Bronchitis    CHF (congestive heart failure) (HCC)    HIV (human immunodeficiency virus infection) (HCC)    Obstructive sleep apnea     Patient Active Problem List   Diagnosis Date Noted   Healthcare maintenance 11/09/2018   HIV disease (HCC) 09/22/2018   Dilated cardiomyopathy (HCC) 09/21/2018   Acute combined systolic and diastolic heart failure (HCC)    Morbid obesity (HCC)    Volume overload 09/17/2018   Peripheral edema    Orthopnea    Cardiomegaly    Uncomplicated asthma     Past Surgical History:  Procedure Laterality Date   RIGHT/LEFT HEART CATH AND CORONARY ANGIOGRAPHY N/A 09/21/2018   Procedure: RIGHT/LEFT HEART CATH AND CORONARY ANGIOGRAPHY;  Surgeon: Marykay Lex, MD;  Location: [facility]  INVASIVE CV LAB;  Service: Cardiovascular;  Laterality: N/A;       Family History  Problem Relation Age of Onset   Hypertension Mother    Diabetes Mellitus II Father    CAD Father        Died at 36 of 'CAD' while sleeping    Obesity Sister    Colon cancer Neg Hx    Esophageal cancer Neg Hx     Social History   Tobacco Use   Smoking status: Former    Types: Cigarettes    Quit date: 05/31/2018    Years since quitting: 2.7   Smokeless tobacco: Never  Vaping Use   Vaping Use: Former  Substance Use Topics   Alcohol use: Yes    Alcohol/week: 3.0 standard drinks    Types: 3 Cans of beer per week    Comment: socially    Drug use: Not Currently    Home Medications Prior to Admission medications   Medication Sig Start Date End Date Taking? Authorizing Provider  oseltamivir (TAMIFLU) 75 MG capsule Take 1 capsule (75 mg total) by mouth every 12 (twelve) hours. 02/16/21  Yes Ernie Avena, MD  predniSONE (DELTASONE) 10 MG tablet Take 4 tablets (40 mg total) by mouth daily. 02/16/21  Yes Ernie Avena, MD  albuterol (VENTOLIN HFA) 108 (90 Base) MCG/ACT inhaler Inhale 1-2 puffs into the lungs every 6 (six) hours as needed for wheezing or shortness of breath. 05/11/19   Laurey Morale,  MD  atorvastatin (LIPITOR) 20 MG tablet TAKE 1 TABLET BY MOUTH EVERY DAY 08/30/20   Laurey Morale, MD  bictegravir-emtricitabine-tenofovir AF (BIKTARVY) 50-200-25 MG TABS tablet Take 1 tablet by mouth daily. 10/12/20   Veryl Speak, FNP  bisoprolol (ZEBETA) 10 MG tablet TAKE 1 TABLET BY MOUTH EVERY DAY 11/22/20   Barbette Merino, NP  CVS ASPIRIN ADULT LOW DOSE 81 MG chewable tablet CHEW 1 TABLET BY MOUTH DAILY. 06/08/20   Robbie Lis M, PA-C  dapagliflozin propanediol (FARXIGA) 10 MG TABS tablet TAKE 1 TABLET (10 MG TOTAL) BY MOUTH DAILY BEFORE BREAKFAST. 04/03/20   Robbie Lis M, PA-C  doxycycline (VIBRAMYCIN) 100 MG capsule Take 1 capsule (100 mg total) by mouth 2 (two) times daily.  02/16/21   Ernie Avena, MD  ENTRESTO 97-103 MG TAKE 1 TABLET BY MOUTH TWICE A DAY 12/29/20   Laurey Morale, MD  fluticasone [facility]) 50 MCG/ACT nasal spray Place 2 sprays into both nostrils daily. 09/16/19   Sudie Grumbling, NP  furosemide (LASIX) 80 MG tablet TAKE 1 TABLET BY MOUTH EVERY DAY 12/11/20   Robbie Lis M, PA-C  isosorbide-hydrALAZINE (BIDIL) 20-37.5 MG tablet Take 2 tablets by mouth 3 (three) times daily. 08/21/20   Laurey Morale, MD  Semaglutide,0.25 or 0.5MG /DOS, (OZEMPIC, 0.25 OR 0.5 MG/DOSE,) 2 MG/1.5ML SOPN Inject 0.5 mg into the skin once a week. 11/10/20   Laurey Morale, MD  spironolactone (ALDACTONE) 25 MG tablet TAKE 1 TABLET BY MOUTH EVERY DAY 10/18/20   Laurey Morale, MD    Allergies    Patient has no known allergies.  Review of Systems   Review of Systems  Constitutional:  Positive for fever.  HENT:  Positive for congestion and rhinorrhea.   Respiratory:  Positive for cough, shortness of breath and wheezing.   Cardiovascular:  Negative for chest pain.  Gastrointestinal:  Positive for diarrhea. Negative for abdominal pain.  Neurological:  Positive for headaches.  All other systems reviewed and are negative.  Physical Exam Updated Vital Signs BP 134/73 (BP Location: Left Arm)   Pulse (!) 110   Temp 100.2 F (37.9 C) (Oral)   Resp (!) 28   Ht 5\' 10"  (1.778 m)   Wt (!) 195 kg   SpO2 96%   BMI 61.70 kg/m   Physical Exam Vitals and nursing note reviewed.  Constitutional:      General: He is not in acute distress.    Appearance: He is well-developed. He is obese. He is not ill-appearing.  HENT:     Head: Normocephalic and atraumatic.  Eyes:     Conjunctiva/sclera: Conjunctivae normal.  Cardiovascular:     Rate and Rhythm: Normal rate and regular rhythm.     Heart sounds: No murmur heard. Pulmonary:     Effort: Pulmonary effort is normal. No respiratory distress.     Breath sounds: Wheezing present.     Comments: Mild bilateral  expiratory wheezing present Abdominal:     Palpations: Abdomen is soft.     Tenderness: There is no abdominal tenderness.  Musculoskeletal:        General: No swelling.     Cervical back: Neck supple.  Skin:    General: Skin is warm and dry.     Capillary Refill: Capillary refill takes less than 2 seconds.  Neurological:     Mental Status: He is alert.     GCS: GCS eye subscore is 4. GCS verbal subscore is 5. GCS motor subscore  is 6.  Psychiatric:        Mood and Affect: Mood normal.    ED Results / Procedures / Treatments   Labs (all labs ordered are listed, but only abnormal results are displayed) Labs Reviewed  RESP PANEL BY RT-PCR (FLU A&B, COVID) ARPGX2 - Abnormal; Notable for the following components:      Result Value   Influenza A by PCR POSITIVE (*)    All other components within normal limits  CBC WITH DIFFERENTIAL/PLATELET  COMPREHENSIVE METABOLIC PANEL  T-HELPER CELLS (CD4) COUNT (NOT AT [facility])  HIV-1 RNA QUANT-NO REFLEX-BLD    EKG None  Radiology DG Chest Portable 1 View  Result Date: 02/16/2021 CLINICAL DATA:  HIV, fever and cough. EXAM: PORTABLE CHEST 1 VIEW COMPARISON:  Chest x-ray 09/16/2019. FINDINGS: There are minimal patchy opacities in the left lung base. There is minimal atelectasis in the right lung base. There is no pleural effusion or pneumothorax. Cardiomediastinal silhouette is within normal limits. No acute fractures are seen. IMPRESSION: 1. Minimal bibasilar atelectasis/airspace disease minimal patchy opacities in the left lung base worrisome for infection. Electronically Signed   By: Darliss Cheney M.D.   On: 02/16/2021 16:20    Procedures Procedures   Medications Ordered in ED Medications  oseltamivir (TAMIFLU) capsule 75 mg (has no administration in time range)  ipratropium-albuterol (DUONEB) 0.5-2.5 (3) MG/3ML nebulizer solution 3 mL (has no administration in time range)  methylPREDNISolone sodium succinate (SOLU-MEDROL) 125 mg/2 mL  injection 125 mg (has no administration in time range)  acetaminophen (TYLENOL) tablet 1,000 mg (1,000 mg Oral Given 02/16/21 1512)    ED Course  I have reviewed the triage vital signs and the nursing notes.  Pertinent labs & imaging results that were available during my care of the patient were reviewed by me and considered in my medical decision making (see chart for details).    MDM Rules/Calculators/A&P                           30 year old male with a history of asthma, CHF, OSA, HIV (last CD4 count greater than 1200 in July 2022) presenting to the Emergency Department with roughly 2 days of cough productive of yellow sputum, fever, headache, muscle aches and chills.  On arrival, the patient was febrile to 103 orally. States that he is compliant with his HIV medications.  He did not get a COVID-vaccine or flu shot.  On arrival, the patient was febrile to 103, tachycardic to 110, mildly tachypneic RR 28, saturating 96% on room air, hemodynamically stable.  Physical exam significant for mild expiratory wheezing bilaterally.  The patient has a known history of asthma.  He presents with a flulike illness that is been present for the past 2 days.  Differential diagnosis includes COVID-19, influenza, viral URI resulting in an asthma exacerbation, bacterial pneumonia.  The patient tested positive for influenza A.  We will treat with Tamiflu given the patient's comorbidity of obesity, CHF, HIV.  Given patient's wheezing on exam, ordered 125 mg of IV Solu-Medrol and a DuoNeb to treat for presumed asthma exacerbation in the setting of influenza.  Patient's chest x-ray revealed possible consolidative process with patchy opacities in the left lung base worrisome for infection.  Will obtain screening labs and consider treatment for possible developing bacterial pneumonia superimposed on top of influenza.  Plan at time of signout to reassess the patient following the above interventions and laboratory  evaluation.  Signout given to  Dr. Lynelle Doctor at 1700.  Final Clinical Impression(s) / ED Diagnoses Final diagnoses:  Influenza A    Rx / DC Orders ED Discharge Orders          Ordered    oseltamivir (TAMIFLU) 75 MG capsule  Every 12 hours        02/16/21 1640    predniSONE (DELTASONE) 10 MG tablet  Daily        02/16/21 1640    doxycycline (VIBRAMYCIN) 100 MG capsule  2 times daily        02/16/21 1642             Ernie Avena, MD 02/16/21 1642

## 2021-02-17 LAB — HIV-1 RNA QUANT-NO REFLEX-BLD
HIV 1 RNA Quant: 30 copies/mL
LOG10 HIV-1 RNA: 1.477 log10copy/mL

## 2021-02-27 ENCOUNTER — Encounter: Payer: Self-pay | Admitting: Family

## 2021-02-27 ENCOUNTER — Ambulatory Visit (INDEPENDENT_AMBULATORY_CARE_PROVIDER_SITE_OTHER): Payer: BC Managed Care – PPO | Admitting: Family

## 2021-02-27 ENCOUNTER — Other Ambulatory Visit: Payer: Self-pay

## 2021-02-27 VITALS — BP 141/80 | HR 89 | Temp 98.3°F | Ht 70.0 in | Wt >= 6400 oz

## 2021-02-27 DIAGNOSIS — Z23 Encounter for immunization: Secondary | ICD-10-CM | POA: Diagnosis not present

## 2021-02-27 DIAGNOSIS — Z0000 Encounter for general adult medical examination without abnormal findings: Secondary | ICD-10-CM | POA: Diagnosis not present

## 2021-02-27 DIAGNOSIS — B20 Human immunodeficiency virus [HIV] disease: Secondary | ICD-10-CM | POA: Diagnosis not present

## 2021-02-27 MED ORDER — BIKTARVY 50-200-25 MG PO TABS: 1.0000 | ORAL_TABLET | Freq: Every day | ORAL | 6 refills | Status: DC

## 2021-02-27 NOTE — Assessment & Plan Note (Signed)
   Discussed importance of safe sexual practice and condom use.  Condoms offered.  Influenza vaccine updated.  Discussed/recommended routine dental care.

## 2021-02-27 NOTE — Progress Notes (Signed)
Brief Narrative   Patient ID: Todd Griffith, male    DOB: 1990-06-23, 30 y.o.   MRN: [id number]  Mr. Todd Griffith if a 30 y/o AA male diagnosed with HIV disease in June of 2020 with risk factor being heterosexual contact. Initial viral load of 739 with CD4 count of 41,600.  Entered care at [facility] Stage 1. No history of opportunistic infection. [id number] negative. Sole ART regimen of Biktarvy.   Subjective:    Chief Complaint  Patient presents with   Follow-up    Declined condoms     HPI:  Todd Griffith is a 30 y.o. male with HIV disease last seen on 10/12/2020 with well-controlled virus and good adherence and tolerance to his ART regimen of Biktarvy.  Viral load at the time was undetectable with CD4 count of 1212.  In the interim he has been diagnosed with influenza with lab work on 02/16/2021 showing influenza A and viral load that remains undetectable.  Now status posttreatment with Tamiflu.  Here today for routine follow-up.  Mr. Todd Griffith continues to take his Biktarvy daily as prescribed with no adverse side effects.  Overall feeling much improved after being diagnosed with the flu approximately 1 and half weeks ago. Denies fevers, chills, night sweats, headaches, changes in vision, neck pain/stiffness, nausea, diarrhea, vomiting, lesions or rashes.  Mr. Todd Griffith has no problems obtaining medication from pharmacy remains covered by [facility].  Denies feelings of being down, depressed, or hopeless recently.  Drinks alcohol socially with no recreational or illicit drug use or tobacco use.  Condoms provided.  Would like to receive flu vaccination.  No Known Allergies    Outpatient Medications Prior to Visit  Medication Sig Dispense Refill   albuterol (VENTOLIN HFA) 108 (90 Base) MCG/ACT inhaler Inhale 1-2 puffs into the lungs every 6 (six) hours as needed for wheezing or shortness of breath. 6.7 g 2   atorvastatin (LIPITOR) 20 MG tablet TAKE 1 TABLET BY MOUTH EVERY DAY  90 tablet 3   bisoprolol (ZEBETA) 10 MG tablet TAKE 1 TABLET BY MOUTH EVERY DAY 90 tablet 3   CVS ASPIRIN ADULT LOW DOSE 81 MG chewable tablet CHEW 1 TABLET BY MOUTH DAILY. 30 tablet 11   dapagliflozin propanediol (FARXIGA) 10 MG TABS tablet TAKE 1 TABLET (10 MG TOTAL) BY MOUTH DAILY BEFORE BREAKFAST. 90 tablet 3   doxycycline (VIBRAMYCIN) 100 MG capsule Take 1 capsule (100 mg total) by mouth 2 (two) times daily. 20 capsule 0   ENTRESTO 97-103 MG TAKE 1 TABLET BY MOUTH TWICE A DAY 180 tablet 3   fluticasone (FLONASE) 50 MCG/ACT nasal spray Place 2 sprays into both nostrils daily. 16 g 0   furosemide (LASIX) 80 MG tablet TAKE 1 TABLET BY MOUTH EVERY DAY 90 tablet 3   isosorbide-hydrALAZINE (BIDIL) 20-37.5 MG tablet Take 2 tablets by mouth 3 (three) times daily. 270 tablet 3   Semaglutide,0.25 or 0.5MG /DOS, (OZEMPIC, 0.25 OR 0.5 MG/DOSE,) 2 MG/1.5ML SOPN Inject 0.5 mg into the skin once a week. 1.5 mL 0   spironolactone (ALDACTONE) 25 MG tablet TAKE 1 TABLET BY MOUTH EVERY DAY 30 tablet 11   bictegravir-emtricitabine-tenofovir AF (BIKTARVY) 50-200-25 MG TABS tablet Take 1 tablet by mouth daily. 30 tablet 6   oseltamivir (TAMIFLU) 75 MG capsule Take 1 capsule (75 mg total) by mouth every 12 (twelve) hours. (Patient not taking: Reported on 02/27/2021) 10 capsule 0   predniSONE (DELTASONE) 10 MG tablet Take 4 tablets (40 mg total) by mouth  daily. (Patient not taking: Reported on 02/27/2021) 15 tablet 0   No facility-administered medications prior to visit.     Past Medical History:  Diagnosis Date   Asthma    Bronchitis    CHF (congestive heart failure) (HCC)    HIV (human immunodeficiency virus infection) (HCC)    Obstructive sleep apnea      Past Surgical History:  Procedure Laterality Date   RIGHT/LEFT HEART CATH AND CORONARY ANGIOGRAPHY N/A 09/21/2018   Procedure: RIGHT/LEFT HEART CATH AND CORONARY ANGIOGRAPHY;  Surgeon: Marykay Lex, MD;  Location: [facility] INVASIVE CV LAB;  Service:  Cardiovascular;  Laterality: N/A;      Review of Systems  Constitutional:  Negative for appetite change, chills, fatigue, fever and unexpected weight change.  Eyes:  Negative for visual disturbance.  Respiratory:  Negative for cough, chest tightness, shortness of breath and wheezing.   Cardiovascular:  Negative for chest pain and leg swelling.  Gastrointestinal:  Negative for abdominal pain, constipation, diarrhea, nausea and vomiting.  Genitourinary:  Negative for dysuria, flank pain, frequency, genital sores, hematuria and urgency.  Skin:  Negative for rash.  Allergic/Immunologic: Negative for immunocompromised state.  Neurological:  Negative for dizziness and headaches.     Objective:    BP (!) 141/80   Pulse 89   Temp 98.3 F (36.8 C) (Oral)   Ht 5\' 10"  (1.778 m)   Wt (!) 433 lb (196.4 kg)   SpO2 97%   BMI 62.13 kg/m  Nursing note and vital signs reviewed.  Physical Exam Constitutional:      General: He is not in acute distress.    Appearance: He is well-developed.  Eyes:     Conjunctiva/sclera: Conjunctivae normal.  Cardiovascular:     Rate and Rhythm: Normal rate and regular rhythm.     Heart sounds: Normal heart sounds. No murmur heard.   No friction rub. No gallop.  Pulmonary:     Effort: Pulmonary effort is normal. No respiratory distress.     Breath sounds: Normal breath sounds. No wheezing or rales.  Chest:     Chest wall: No tenderness.  Abdominal:     General: Bowel sounds are normal.     Palpations: Abdomen is soft.     Tenderness: There is no abdominal tenderness.  Musculoskeletal:     Cervical back: Neck supple.  Lymphadenopathy:     Cervical: No cervical adenopathy.  Skin:    General: Skin is warm and dry.     Findings: No rash.  Neurological:     Mental Status: He is alert and oriented to person, place, and time.  Psychiatric:        Behavior: Behavior normal.        Thought Content: Thought content normal.        Judgment: Judgment  normal.     Depression screen [facility] 2/9 02/27/2021 05/31/2020 02/01/2020 10/21/2019 02/23/2019  Decreased Interest 0 1 0 1 0  Down, Depressed, Hopeless 1 1 1 1  0  PHQ - 2 Score 1 2 1 2  0  Altered sleeping - 2 - 3 -  Tired, decreased energy - 1 - 1 -  Change in appetite - 1 - 0 -  Feeling bad or failure about yourself  - 1 - 2 -  Trouble concentrating - 1 - 0 -  Moving slowly or fidgety/restless - 1 - 1 -  Suicidal thoughts - 0 - 0 -  PHQ-9 Score - 9 - 9 -  Difficult doing work/chores -  Not difficult at all - Somewhat difficult -       Assessment & Plan:    Patient Active Problem List   Diagnosis Date Noted   Healthcare maintenance 11/09/2018   HIV disease ([place]) 09/22/2018   Dilated cardiomyopathy ([place]) 09/21/2018   Acute combined systolic and diastolic heart failure (HCC)    Morbid obesity (HCC)    Volume overload 09/17/2018   Peripheral edema    Orthopnea    Cardiomegaly    Uncomplicated asthma      Problem List Items Addressed This Visit       Other   Morbid obesity [facility])    Mr. Motte is considering gastric sleeve surgery near the beginning of the year.  Discussed that there would be no contraindications from HIV standpoint to proceed with surgery.  Advised to have surgeon provide paperwork if necessary.      HIV disease ([place]) - Primary    Mr. Halbrooks continues to have well-controlled virus with good adherence and tolerance to his ART regimen of Biktarvy.  No signs/symptoms of opportunistic infection.  We reviewed lab work and discussed plan of care.  Continue current dose of Biktarvy.  Plan for follow-up in 4 months or sooner if needed with lab work on the same day.      Relevant Medications   bictegravir-emtricitabine-tenofovir AF (BIKTARVY) 50-200-25 MG TABS tablet   Healthcare maintenance    Discussed importance of safe sexual practice and condom use.  Condoms offered. Influenza vaccine updated. Discussed/recommended routine dental care.      Other Visit  Diagnoses     Need for immunization against influenza       Relevant Orders   Flu Vaccine QUAD 22mo+IM (Fluarix, Fluzone & Alfiuria Quad PF) (Completed)        I have discontinued Arish M. Carriveau's oseltamivir and predniSONE. I am also having him maintain his albuterol, fluticasone, dapagliflozin propanediol, CVS Aspirin Adult Low Dose, isosorbide-hydrALAZINE, atorvastatin, spironolactone, Ozempic (0.25 or 0.5 MG/DOSE), bisoprolol, furosemide, Entresto, doxycycline, and Biktarvy.   Meds ordered this encounter  Medications   bictegravir-emtricitabine-tenofovir AF (BIKTARVY) 50-200-25 MG TABS tablet    Sig: Take 1 tablet by mouth daily.    Dispense:  30 tablet    Refill:  6    Order Specific Question:   Supervising Provider    Answer:   Carlyle Basques [[id number]]     Follow-up: Return in about 4 months (around 06/27/2021), or if symptoms worsen or fail to improve.   Terri Piedra, MSN, FNP-C Nurse Practitioner [facility] for Infectious Disease [place] number: [phone]

## 2021-02-27 NOTE — Assessment & Plan Note (Signed)
Todd Griffith continues to have well-controlled virus with good adherence and tolerance to his ART regimen of Biktarvy.  No signs/symptoms of opportunistic infection.  We reviewed lab work and discussed plan of care.  Continue current dose of Biktarvy.  Plan for follow-up in 4 months or sooner if needed with lab work on the same day.

## 2021-02-27 NOTE — Assessment & Plan Note (Signed)
Mr. Jun is considering gastric sleeve surgery near the beginning of the year.  Discussed that there would be no contraindications from HIV standpoint to proceed with surgery.  Advised to have surgeon provide paperwork if necessary.

## 2021-02-27 NOTE — Patient Instructions (Signed)
Nice to see you.  Continue to take your medication daily as prescribed.  Refills have been sent to the pharmacy.  Plan for follow up in 4 months or sooner if needed with lab work on the same day.  Have a great day and stay safe!  

## 2021-04-12 ENCOUNTER — Other Ambulatory Visit (HOSPITAL_COMMUNITY): Payer: Self-pay | Admitting: Cardiology

## 2021-04-30 ENCOUNTER — Telehealth: Payer: Self-pay

## 2021-04-30 ENCOUNTER — Other Ambulatory Visit (HOSPITAL_COMMUNITY): Payer: Self-pay

## 2021-04-30 NOTE — Telephone Encounter (Signed)
Patient called requesting a sample of Biktarvy from our office due to having a debt balance at [facility] pharmacy. Patient stated that he would be able to pay the balance on Friday 2/3. 7 day supply of Biktarvy will be at the front desk for patient to pick up.    Wadsworth Skolnick Lesli Albee, CMA

## 2021-05-03 ENCOUNTER — Other Ambulatory Visit: Payer: Self-pay | Admitting: Pharmacist

## 2021-05-03 DIAGNOSIS — B20 Human immunodeficiency virus [HIV] disease: Secondary | ICD-10-CM

## 2021-05-03 MED ORDER — BIKTARVY 50-200-25 MG PO TABS: 1.0000 | ORAL_TABLET | Freq: Every day | ORAL | 0 refills | Status: AC

## 2021-05-03 NOTE — Progress Notes (Signed)
Medication Samples have been provided to the patient. ° °Drug name: Biktarvy        °Strength: 50/200/25 mg       °Qty: 14 tablets (2 bottles) °LOT: [id number]   °Exp.Date: 12/31/2022 ° °Dosing instructions: Take one tablet by mouth once daily ° °The patient has been instructed regarding the correct time, dose, and frequency of taking this medication, including desired effects and most common side effects.  ° °Skarlet Lyons L. Herold Salguero, PharmD, BCIDP, AAHIVP, CPP °Clinical Pharmacist Practitioner °Infectious Diseases Clinical Pharmacist °[facility] °03/13/2020, 10:07 AM ° °

## 2021-06-26 ENCOUNTER — Encounter: Payer: Self-pay | Admitting: Family

## 2021-06-26 ENCOUNTER — Ambulatory Visit (INDEPENDENT_AMBULATORY_CARE_PROVIDER_SITE_OTHER): Payer: BC Managed Care – PPO | Admitting: Family

## 2021-06-26 ENCOUNTER — Other Ambulatory Visit: Payer: Self-pay

## 2021-06-26 VITALS — BP 142/81 | HR 85 | Resp 16 | Ht 70.0 in | Wt >= 6400 oz

## 2021-06-26 DIAGNOSIS — Z113 Encounter for screening for infections with a predominantly sexual mode of transmission: Secondary | ICD-10-CM

## 2021-06-26 DIAGNOSIS — B20 Human immunodeficiency virus [HIV] disease: Secondary | ICD-10-CM

## 2021-06-26 DIAGNOSIS — Z0000 Encounter for general adult medical examination without abnormal findings: Secondary | ICD-10-CM

## 2021-06-26 MED ORDER — BIKTARVY 50-200-25 MG PO TABS: 1.0000 | ORAL_TABLET | Freq: Every day | ORAL | 6 refills | Status: DC

## 2021-06-26 NOTE — Progress Notes (Signed)
? ? ?Brief Narrative  ? ?Patient ID: Todd Griffith, male    DOB: [birth year], 30 y.o.   MRN: [id number] ? ?Todd Griffith if a 31 y/o AA male diagnosed with HIV disease in June of 2020 with risk factor being heterosexual contact. Initial viral load of 739 with CD4 count of 41,600.  Genotype never resulted. Entered care at [facility] Stage 1. No history of opportunistic infection. [id number] negative. Sole ART regimen of Biktarvy.  ? ?Subjective:  ?  ?Chief Complaint  ?Patient presents with  ? HIV Positive/AIDS  ? ? ?HPI: ? ?Todd Griffith is a 31 y.o. male with HIV diease last seen on 02/27/21 with well controlled virus and good adherence and tolerance to his ART regimen of Biktarvy. Viral load was undetectable with CD4 count of 1212. Renal function mildly elevated with creatinine of 1.55 with normal liver function and electrolytes. Here today for follow up.  ? ?Todd Griffith continues to take his Biktarvy daily as prescribed with no adverse side effects. Overall feeling well with no new concerns/complaints. Denies fevers, chills, night sweats, headaches, changes in vision, neck pain/stiffness, nausea, diarrhea, vomiting, lesions or rashes. ? ?Todd Griffith has no problems obtaining medication from the pharmacy. Denies feelings of being down, depressed or hopeless recently. No current recreational or illicit drug use, tobacco use or alcohol consumption. Condoms offered. Healthcare maintenance due includes Menveo. Planning on trips to [place] and [place] for vacation. Working on weight loss in preparation for potential gastric sleeve.  ? ?No Known Allergies ? ? ? ?Outpatient Medications Prior to Visit  ?Medication Sig Dispense Refill  ? albuterol (VENTOLIN HFA) 108 (90 Base) MCG/ACT inhaler Inhale 1-2 puffs into the lungs every 6 (six) hours as needed for wheezing or shortness of breath. 6.7 g 2  ? atorvastatin (LIPITOR) 20 MG tablet TAKE 1 TABLET BY MOUTH EVERY DAY 90 tablet 3  ? bisoprolol (ZEBETA) 10 MG tablet TAKE 1  TABLET BY MOUTH EVERY DAY 90 tablet 3  ? CVS ASPIRIN ADULT LOW DOSE 81 MG chewable tablet CHEW 1 TABLET BY MOUTH DAILY. 30 tablet 11  ? dapagliflozin propanediol (FARXIGA) 10 MG TABS tablet TAKE 1 TABLET (10 MG TOTAL) BY MOUTH DAILY BEFORE BREAKFAST. 90 tablet 3  ? ENTRESTO 97-103 MG TAKE 1 TABLET BY MOUTH TWICE A DAY 180 tablet 3  ? fluticasone (FLONASE) 50 MCG/ACT nasal spray Place 2 sprays into both nostrils daily. 16 g 0  ? furosemide (LASIX) 80 MG tablet TAKE 1 TABLET BY MOUTH EVERY DAY 90 tablet 3  ? isosorbide-hydrALAZINE (BIDIL) 20-37.5 MG tablet Take 2 tablets by mouth 3 (three) times daily. 270 tablet 3  ? Semaglutide,0.25 or 0.5MG /DOS, (OZEMPIC, 0.25 OR 0.5 MG/DOSE,) 2 MG/1.5ML SOPN Inject 0.5 mg into the skin once a week. 1.5 mL 0  ? spironolactone (ALDACTONE) 25 MG tablet TAKE 1 TABLET BY MOUTH EVERY DAY 30 tablet 11  ? bictegravir-emtricitabine-tenofovir AF (BIKTARVY) 50-200-25 MG TABS tablet Take 1 tablet by mouth daily. 30 tablet 6  ? doxycycline (VIBRAMYCIN) 100 MG capsule Take 1 capsule (100 mg total) by mouth 2 (two) times daily. (Patient not taking: Reported on 06/26/2021) 20 capsule 0  ? ?No facility-administered medications prior to visit.  ? ? ? ?Past Medical History:  ?Diagnosis Date  ? Asthma   ? Bronchitis   ? CHF (congestive heart failure) (HCC)   ? HIV (human immunodeficiency virus infection) (HCC)   ? Obstructive sleep apnea   ? ? ? ?Past Surgical History:  ?Procedure Laterality  Date  ? RIGHT/LEFT HEART CATH AND CORONARY ANGIOGRAPHY N/A 09/21/2018  ? Procedure: RIGHT/LEFT HEART CATH AND CORONARY ANGIOGRAPHY;  Surgeon: Marykay Lex, MD;  Location: [facility] INVASIVE CV LAB;  Service: Cardiovascular;  Laterality: N/A;  ? ? ? ? ?Review of Systems  ?Constitutional:  Negative for appetite change, chills, fatigue, fever and unexpected weight change.  ?Eyes:  Negative for visual disturbance.  ?Respiratory:  Negative for cough, chest tightness, shortness of breath and wheezing.   ?Cardiovascular:   Negative for chest pain and leg swelling.  ?Gastrointestinal:  Negative for abdominal pain, constipation, diarrhea, nausea and vomiting.  ?Genitourinary:  Negative for dysuria, flank pain, frequency, genital sores, hematuria and urgency.  ?Skin:  Negative for rash.  ?Allergic/Immunologic: Negative for immunocompromised state.  ?Neurological:  Negative for dizziness and headaches.  ?   ?Objective:  ?  ?BP (!) 142/81   Pulse 85   Resp 16   Ht 5\' 10"  (1.778 m)   Wt (!) 430 lb (195 kg)   SpO2 91%   BMI 61.70 kg/m?  ?Nursing note and vital signs reviewed. ? ? ?Physical Exam ?Constitutional:   ?   General: He is not in acute distress. ?   Appearance: He is well-developed. He is obese.  ?Eyes:  ?   Conjunctiva/sclera: Conjunctivae normal.  ?Cardiovascular:  ?   Rate and Rhythm: Normal rate and regular rhythm.  ?   Heart sounds: Normal heart sounds. No murmur heard. ?  No friction rub. No gallop.  ?Pulmonary:  ?   Effort: Pulmonary effort is normal. No respiratory distress.  ?   Breath sounds: Normal breath sounds. No wheezing or rales.  ?Chest:  ?   Chest wall: No tenderness.  ?Abdominal:  ?   General: Bowel sounds are normal.  ?   Palpations: Abdomen is soft.  ?   Tenderness: There is no abdominal tenderness.  ?Musculoskeletal:  ?   Cervical back: Neck supple.  ?Lymphadenopathy:  ?   Cervical: No cervical adenopathy.  ?Skin: ?   General: Skin is warm and dry.  ?   Findings: No rash.  ?Neurological:  ?   Mental Status: He is alert and oriented to person, place, and time.  ?Psychiatric:     ?   Mood and Affect: Mood normal.  ? ? ? ? ?  06/26/2021  ?  4:08 PM 02/27/2021  ?  4:11 PM 05/31/2020  ?  4:44 PM 02/01/2020  ?  4:11 PM 10/21/2019  ?  2:30 PM  ?Depression screen PHQ 2/9  ?Decreased Interest 0 0 1 0 1  ?Down, Depressed, Hopeless 0 1 1 1 1   ?PHQ - 2 Score 0 1 2 1 2   ?Altered sleeping   2  3  ?Tired, decreased energy   1  1  ?Change in appetite   1  0  ?Feeling bad or failure about yourself    1  2  ?Trouble  concentrating   1  0  ?Moving slowly or fidgety/restless   1  1  ?Suicidal thoughts   0  0  ?PHQ-9 Score   9  9  ?Difficult doing work/chores   Not difficult at all  Somewhat difficult  ?  ?   ?Assessment & Plan:  ? ? ?Patient Active Problem List  ? Diagnosis Date Noted  ? Healthcare maintenance 11/09/2018  ? HIV disease (HCC) 09/22/2018  ? Dilated cardiomyopathy (HCC) 09/21/2018  ? Acute combined systolic and diastolic heart failure (HCC)   ?  Morbid obesity (HCC)   ? Volume overload 09/17/2018  ? Peripheral edema   ? Orthopnea   ? Cardiomegaly   ? Uncomplicated asthma   ? ? ? ?Problem List Items Addressed This Visit   ? ?  ? Other  ? Morbid obesity (HCC)  ?  Working on weight loss through lifestyle changes with intention to proceed with gastric sleeve in the near future.  ?  ?  ? HIV disease (HCC) - Primary  ?  Mr. Mansoor continues to have well controlled virus with good adherence and tolerance to his ART regimen of Biktarvy. No signs/symptoms of opportunistic infection. Reviewed lab work and discussed plan of care. Check lab work today. Continue current dose of Biktarvy. Plan for follow up in 4 months or sooner if needed with lab work on the same day.  ?  ?  ? Relevant Medications  ? bictegravir-emtricitabine-tenofovir AF (BIKTARVY) 50-200-25 MG TABS tablet  ? Other Relevant Orders  ? T-helper cell (CD4)- ([facility] clinic only)  ? HIV-1 RNA quant-no reflex-bld  ? Comprehensive metabolic panel  ? Healthcare maintenance  ?  Discussed importance of safe sexual practice and condom use. Condoms offered. ?Vaccinations declined. ?Encouraged to complete routine dental care which he will do independently - can refer to [facility] if needed.  ?  ?  ? ?Other Visit Diagnoses   ? ? Screening for STDs (sexually transmitted diseases)      ? Relevant Orders  ? RPR  ? ?  ? ? ? ?I have discontinued Faysal M. Call's doxycycline. I am also having him maintain his albuterol, fluticasone, dapagliflozin propanediol, CVS Aspirin Adult Low Dose,  isosorbide-hydrALAZINE, atorvastatin, spironolactone, Ozempic (0.25 or 0.5 MG/DOSE), bisoprolol, furosemide, Entresto, and Biktarvy. ? ? ?Meds ordered this encounter  ?Medications  ? bictegravir-emtricitabine-tenofovir AF

## 2021-06-26 NOTE — Patient Instructions (Addendum)
Nice to see you.  We will check your lab work today.  Continue to take your medication daily as prescribed.  Refills have been sent to the pharmacy.  Plan for follow up in 4 months or sooner if needed with lab work on the same day.  Have a great day and stay safe!  

## 2021-06-26 NOTE — Assessment & Plan Note (Signed)
Todd Griffith continues to have well controlled virus with good adherence and tolerance to his ART regimen of Biktarvy. No signs/symptoms of opportunistic infection. Reviewed lab work and discussed plan of care. Check lab work today. Continue current dose of Biktarvy. Plan for follow up in 4 months or sooner if needed with lab work on the same day.  ?

## 2021-06-26 NOTE — Assessment & Plan Note (Signed)
?   Discussed importance of safe sexual practice and condom use. Condoms offered. ?? Vaccinations declined. ?? Encouraged to complete routine dental care which he will do independently - can refer to [facility] if needed.  ?

## 2021-06-26 NOTE — Assessment & Plan Note (Signed)
Working on weight loss through lifestyle changes with intention to proceed with gastric sleeve in the near future.  ?

## 2021-06-27 ENCOUNTER — Ambulatory Visit: Payer: Self-pay | Admitting: Nurse Practitioner

## 2021-06-27 LAB — T-HELPER CELL (CD4) - (RCID CLINIC ONLY)
CD4 % Helper T Cell: 44 % (ref 33–65)
CD4 T Cell Abs: 1188 /uL (ref 400–1790)

## 2021-06-28 ENCOUNTER — Other Ambulatory Visit (HOSPITAL_COMMUNITY): Payer: Self-pay | Admitting: Cardiology

## 2021-06-28 LAB — COMPREHENSIVE METABOLIC PANEL
AG Ratio: 1.4 (calc) (ref 1.0–2.5)
ALT: 29 U/L (ref 9–46)
AST: 23 U/L (ref 10–40)
Albumin: 3.9 g/dL (ref 3.6–5.1)
Alkaline phosphatase (APISO): 80 U/L (ref 36–130)
BUN: 17 mg/dL (ref 7–25)
CO2: 25 mmol/L (ref 20–32)
Calcium: 9.1 mg/dL (ref 8.6–10.3)
Chloride: 106 mmol/L (ref 98–110)
Creat: 1.06 mg/dL (ref 0.60–1.26)
Globulin: 2.8 g/dL (calc) (ref 1.9–3.7)
Glucose, Bld: 94 mg/dL (ref 65–99)
Potassium: 4 mmol/L (ref 3.5–5.3)
Sodium: 140 mmol/L (ref 135–146)
Total Bilirubin: 0.9 mg/dL (ref 0.2–1.2)
Total Protein: 6.7 g/dL (ref 6.1–8.1)

## 2021-06-28 LAB — RPR: RPR Ser Ql: NONREACTIVE

## 2021-06-28 LAB — HIV-1 RNA QUANT-NO REFLEX-BLD
HIV 1 RNA Quant: NOT DETECTED Copies/mL
HIV-1 RNA Quant, Log: NOT DETECTED Log cps/mL

## 2021-07-02 ENCOUNTER — Ambulatory Visit: Payer: BC Managed Care – PPO | Admitting: Nurse Practitioner

## 2021-07-02 ENCOUNTER — Encounter: Payer: Self-pay | Admitting: Nurse Practitioner

## 2021-07-02 VITALS — BP 132/85 | HR 86 | Temp 97.2°F | Ht 70.0 in | Wt >= 6400 oz

## 2021-07-02 DIAGNOSIS — R21 Rash and other nonspecific skin eruption: Secondary | ICD-10-CM

## 2021-07-02 DIAGNOSIS — L304 Erythema intertrigo: Secondary | ICD-10-CM | POA: Diagnosis not present

## 2021-07-02 MED ORDER — HYDROXYZINE HCL 10 MG PO TABS: 10.0000 mg | ORAL_TABLET | Freq: Three times a day (TID) | ORAL | 0 refills | Status: DC | PRN

## 2021-07-02 MED ORDER — TRIAMCINOLONE ACETONIDE 0.1 % EX CREA: TOPICAL_CREAM | Freq: Every day | CUTANEOUS | 1 refills | Status: AC

## 2021-07-02 NOTE — Patient Instructions (Signed)
You were seen today in the [facility] for rash and itching. You were prescribed medications, please take as directed. Please follow up in 1-2 wks as needed  ?

## 2021-07-02 NOTE — Progress Notes (Signed)
? ?[facility] ?[address] ?[place], [place]  [address] ?Phone:  [phone]   Fax:  [phone] ?Subjective:  ? Patient ID: Todd Griffith, male    DOB: 1990-12-18, 30 y.o.   MRN: [id number] ? ?No chief complaint on file. ? ?HPI ?Todd Griffith 31 y.o. male  has a past medical history of Asthma, Bronchitis, CHF (congestive heart failure) (HCC), HIV (human immunodeficiency virus infection) (HCC), and Obstructive sleep apnea. To the [facility] for rash on diffuse chest. ? ?Patient states that he has diffuse rash beneath bilateral breast x 2-3 mths. Endorses itching, but denies any pain. Has been applying Vaseline to area with no improvement in symptoms. Denies any drainage from site. Denis any history of similar symptoms. Denies any other concerns today. ? ?Denies any fatigue, chest pain, shortness of breath, HA or dizziness. Denies any blurred vision, numbness or tingling. ? ?Past Medical History:  ?Diagnosis Date  ? Asthma   ? Bronchitis   ? CHF (congestive heart failure) (HCC)   ? HIV (human immunodeficiency virus infection) (HCC)   ? Obstructive sleep apnea   ? ? ?Past Surgical History:  ?Procedure Laterality Date  ? RIGHT/LEFT HEART CATH AND CORONARY ANGIOGRAPHY N/A 09/21/2018  ? Procedure: RIGHT/LEFT HEART CATH AND CORONARY ANGIOGRAPHY;  Surgeon: Marykay Lex, MD;  Location: [facility] INVASIVE CV LAB;  Service: Cardiovascular;  Laterality: N/A;  ? ? ?Family History  ?Problem Relation Age of Onset  ? Hypertension Mother   ? Diabetes Mellitus II Father   ? CAD Father   ?     Died at 32 of 'CAD' while sleeping   ? Obesity Sister   ? Colon cancer Neg Hx   ? Esophageal cancer Neg Hx   ? ? ?Social History  ? ?Socioeconomic History  ? Marital status: Significant Other  ?  Spouse name: Not on file  ? Number of children: 2  ? Years of education: 62  ? Highest education level: Bachelor's degree (e.g., BA, AB, BS)  ?Occupational History  ? Occupation: Sr Advertising account executive  ?   Employer: [employer]  ?Tobacco Use  ? Smoking status: Former  ?  Types: Cigarettes  ?  Quit date: 05/31/2018  ?  Years since quitting: 3.0  ? Smokeless tobacco: Never  ?Vaping Use  ? Vaping Use: Former  ?Substance and Sexual Activity  ? Alcohol use: Yes  ?  Alcohol/week: 3.0 standard drinks  ?  Types: 3 Cans of beer per week  ?  Comment: socially   ? Drug use: Not Currently  ? Sexual activity: Yes  ?  Partners: Female  ?Other Topics Concern  ? Not on file  ?Social History Narrative  ? Not on file  ? ?Social Determinants of Health  ? ?Financial Resource Strain: Not on file  ?Food Insecurity: Not on file  ?Transportation Needs: Not on file  ?Physical Activity: Not on file  ?Stress: Not on file  ?Social Connections: Not on file  ?Intimate Partner Violence: Not on file  ? ? ?Outpatient Medications Prior to Visit  ?Medication Sig Dispense Refill  ? albuterol (VENTOLIN HFA) 108 (90 Base) MCG/ACT inhaler Inhale 1-2 puffs into the lungs every 6 (six) hours as needed for wheezing or shortness of breath. 6.7 g 2  ? atorvastatin (LIPITOR) 20 MG tablet TAKE 1 TABLET BY MOUTH EVERY DAY 90 tablet 3  ? bictegravir-emtricitabine-tenofovir AF (BIKTARVY) 50-200-25 MG TABS tablet Take 1 tablet by mouth daily. 30 tablet 6  ?  bisoprolol (ZEBETA) 10 MG tablet TAKE 1 TABLET BY MOUTH EVERY DAY 90 tablet 3  ? CVS ASPIRIN ADULT LOW DOSE 81 MG chewable tablet CHEW 1 TABLET BY MOUTH DAILY. 30 tablet 11  ? dapagliflozin propanediol (FARXIGA) 10 MG TABS tablet TAKE 1 TABLET BY MOUTH DAILY BEFORE BREAKFAST. 90 tablet 0  ? ENTRESTO 97-103 MG TAKE 1 TABLET BY MOUTH TWICE A DAY 180 tablet 3  ? fluticasone (FLONASE) 50 MCG/ACT nasal spray Place 2 sprays into both nostrils daily. 16 g 0  ? furosemide (LASIX) 80 MG tablet TAKE 1 TABLET BY MOUTH EVERY DAY 90 tablet 3  ? isosorbide-hydrALAZINE (BIDIL) 20-37.5 MG tablet Take 2 tablets by mouth 3 (three) times daily. 270 tablet 3  ? Semaglutide,0.25 or 0.5MG /DOS, (OZEMPIC, 0.25 OR 0.5 MG/DOSE,) 2  MG/1.5ML SOPN Inject 0.5 mg into the skin once a week. 1.5 mL 0  ? spironolactone (ALDACTONE) 25 MG tablet TAKE 1 TABLET BY MOUTH EVERY DAY 30 tablet 11  ? ?No facility-administered medications prior to visit.  ? ? ?No Known Allergies ? ?Review of Systems  ?Constitutional:  Negative for chills, fever and malaise/fatigue.  ?Respiratory:  Negative for cough and shortness of breath.   ?Cardiovascular:  Negative for chest pain, palpitations and leg swelling.  ?Gastrointestinal:  Negative for abdominal pain, blood in stool, constipation, diarrhea, nausea and vomiting.  ?Skin:  Positive for itching and rash.  ?Neurological: Negative.   ?Psychiatric/Behavioral:  Negative for depression. The patient is not nervous/anxious.   ?All other systems reviewed and are negative. ? ?   ?Objective:  ?  ?Physical Exam ?Vitals reviewed.  ?Constitutional:   ?   General: He is not in acute distress. ?   Appearance: Normal appearance. He is obese.  ?HENT:  ?   Head: Normocephalic.  ?Cardiovascular:  ?   Rate and Rhythm: Normal rate and regular rhythm.  ?   Pulses: Normal pulses.  ?   Heart sounds: Normal heart sounds.  ?   Comments: No obvious peripheral edema ?Pulmonary:  ?   Effort: Pulmonary effort is normal.  ?   Breath sounds: Normal breath sounds.  ?Skin: ?   General: Skin is warm and dry.  ?   Capillary Refill: Capillary refill takes less than 2 seconds.  ?   Findings: Rash present.  ?   Comments: Diffuse rash noted beneath bilateral breast, consistent with candida  ?Neurological:  ?   General: No focal deficit present.  ?   Mental Status: He is alert and oriented to person, place, and time.  ?Psychiatric:     ?   Mood and Affect: Mood normal.     ?   Behavior: Behavior normal.     ?   Thought Content: Thought content normal.     ?   Judgment: Judgment normal.  ? ? ?BP 132/85 (BP Location: Right Arm, Patient Position: Sitting, Cuff Size: Large)   Pulse 86   Temp (!) 97.2 ?F (36.2 ?C)   Ht 5\' 10"  (1.778 m)   Wt (!) 423 lb 8 oz  (192.1 kg)   SpO2 98%   BMI 60.77 kg/m?  ?Wt Readings from Last 3 Encounters:  ?07/02/21 (!) 423 lb 8 oz (192.1 kg)  ?06/26/21 (!) 430 lb (195 kg)  ?02/27/21 (!) 433 lb (196.4 kg)  ? ? ?Immunization History  ?Administered Date(s) Administered  ? Influenza,inj,Quad PF,6+ Mos 02/27/2021  ? PPD Test 11/08/2011  ? Pneumococcal Conjugate-13 11/09/2018  ? Pneumococcal Polysaccharide-23 02/23/2019  ? Tdap  10/07/2018  ? ? ?Diabetic Foot Exam - Simple   ?No data filed ?  ? ? ?Lab Results  ?Component Value Date  ? TSH 2.117 09/18/2018  ? ?Lab Results  ?Component Value Date  ? WBC 7.0 02/16/2021  ? HGB 12.6 (L) 02/16/2021  ? HCT 37.8 (L) 02/16/2021  ? MCV 97.7 02/16/2021  ? PLT 209 02/16/2021  ? ?Lab Results  ?Component Value Date  ? NA 140 06/26/2021  ? K 4.0 06/26/2021  ? CO2 25 06/26/2021  ? GLUCOSE 94 06/26/2021  ? BUN 17 06/26/2021  ? CREATININE 1.06 06/26/2021  ? BILITOT 0.9 06/26/2021  ? ALKPHOS 82 02/16/2021  ? AST 23 06/26/2021  ? ALT 29 06/26/2021  ? PROT 6.7 06/26/2021  ? ALBUMIN 3.9 02/16/2021  ? CALCIUM 9.1 06/26/2021  ? ANIONGAP 7 02/16/2021  ? ?Lab Results  ?Component Value Date  ? CHOL 111 11/10/2020  ? CHOL 115 04/04/2020  ? CHOL 107 11/18/2019  ? ?Lab Results  ?Component Value Date  ? HDL 27 (L) 11/10/2020  ? HDL 25 (L) 04/04/2020  ? HDL 30 (L) 11/18/2019  ? ?Lab Results  ?Component Value Date  ? LDLCALC 50 11/10/2020  ? LDLCALC 58 04/04/2020  ? LDLCALC 54 11/18/2019  ? ?Lab Results  ?Component Value Date  ? TRIG 209 (H) 11/10/2020  ? TRIG 161 (H) 04/04/2020  ? TRIG 116 11/18/2019  ? ?Lab Results  ?Component Value Date  ? CHOLHDL 4.1 11/10/2020  ? CHOLHDL 4.6 04/04/2020  ? CHOLHDL 3.6 11/18/2019  ? ?Lab Results  ?Component Value Date  ? HGBA1C 5.2 11/10/2020  ? HGBA1C 4.8 11/18/2019  ? HGBA1C 4.5 (L) 09/18/2018  ? ? ?   ?Assessment & Plan:  ? ?Problem List Items Addressed This Visit   ?None ?Visit Diagnoses   ? ? Rash    -  Primary  ? Pruritic intertrigo      ? Relevant Medications  ? ketoconazole  2%-triamcinolone 0.1% 1:2 cream mixture  ? hydrOXYzine (ATARAX) 10 MG tablet ?Discussed non pharmacological methods for management of symptoms ?Informed to take OTC medications as needed ?  ? ?Follow up in 1-2 wks as needed if sym

## 2021-07-18 ENCOUNTER — Other Ambulatory Visit (HOSPITAL_COMMUNITY): Payer: Self-pay | Admitting: Cardiology

## 2021-08-21 ENCOUNTER — Other Ambulatory Visit (HOSPITAL_COMMUNITY): Payer: Self-pay

## 2021-08-23 ENCOUNTER — Other Ambulatory Visit: Payer: Self-pay | Admitting: Pharmacist

## 2021-08-23 DIAGNOSIS — B20 Human immunodeficiency virus [HIV] disease: Secondary | ICD-10-CM

## 2021-08-23 MED ORDER — BIKTARVY 50-200-25 MG PO TABS: 1.0000 | ORAL_TABLET | Freq: Every day | ORAL | 0 refills | Status: AC

## 2021-08-23 NOTE — Progress Notes (Signed)
Medication Samples have been provided to the patient.  Drug name: Biktarvy        Strength: 50/200/25 mg       Qty: 7 tablets (1 bottle)   LOT: [id number]   Exp.Date: 12/2023  Dosing instructions: Take one tablet by mouth once daily  The patient has been instructed regarding the correct time, dose, and frequency of taking this medication, including desired effects and most common side effects.   Vernetta Dizdarevic L. Jamiya Nims, PharmD, BCIDP, AAHIVP, CPP Clinical Pharmacist Practitioner Infectious Diseases Clinical Pharmacist [facility] 03/13/2020, 10:07 AM. 

## 2021-09-22 ENCOUNTER — Other Ambulatory Visit (HOSPITAL_COMMUNITY): Payer: Self-pay | Admitting: Cardiology

## 2021-10-10 ENCOUNTER — Encounter: Payer: Self-pay | Admitting: Pharmacist

## 2021-10-16 ENCOUNTER — Encounter: Payer: Self-pay | Admitting: Family

## 2021-10-16 ENCOUNTER — Other Ambulatory Visit: Payer: Self-pay | Admitting: Family

## 2021-10-16 ENCOUNTER — Ambulatory Visit (INDEPENDENT_AMBULATORY_CARE_PROVIDER_SITE_OTHER): Payer: BC Managed Care – PPO | Admitting: Family

## 2021-10-16 ENCOUNTER — Other Ambulatory Visit: Payer: Self-pay

## 2021-10-16 VITALS — BP 161/78 | HR 84 | Temp 97.5°F | Ht 70.0 in | Wt >= 6400 oz

## 2021-10-16 DIAGNOSIS — Z0000 Encounter for general adult medical examination without abnormal findings: Secondary | ICD-10-CM | POA: Diagnosis not present

## 2021-10-16 DIAGNOSIS — B20 Human immunodeficiency virus [HIV] disease: Secondary | ICD-10-CM

## 2021-10-16 DIAGNOSIS — Z113 Encounter for screening for infections with a predominantly sexual mode of transmission: Secondary | ICD-10-CM

## 2021-10-16 DIAGNOSIS — Z5181 Encounter for therapeutic drug level monitoring: Secondary | ICD-10-CM

## 2021-10-16 MED ORDER — BIKTARVY 50-200-25 MG PO TABS: 1.0000 | ORAL_TABLET | Freq: Every day | ORAL | 6 refills | Status: DC

## 2021-10-16 NOTE — Progress Notes (Unsigned)
Brief Narrative   Patient ID: Todd Griffith, male    DOB: 06-13-1990, 31 y.o.   MRN: [id number]  Todd Griffith if a 31 y/o AA male diagnosed with HIV disease in June of 2020 with risk factor being heterosexual contact. Initial viral load of 739 with CD4 count of 41,600.  Genotype never resulted. Entered care at [facility] Stage 1. No history of opportunistic infection. [id number] negative. Sole ART regimen of Biktarvy.   Subjective:    No chief complaint on file.   HPI:  Todd Griffith is a 31 y.o. male with HIV disease last seen on 06/26/2021 with well-controlled virus and good adherence and tolerance to his ART regimen of Biktarvy.  Viral load was undetectable with CD4 count 1180.  Kidney function, liver function, electrolytes within normal ranges.  Here today for routine follow-up.  No problems with medications.  Condoms; Due for dental visit;   No Known Allergies    Outpatient Medications Prior to Visit  Medication Sig Dispense Refill   albuterol (VENTOLIN HFA) 108 (90 Base) MCG/ACT inhaler Inhale 1-2 puffs into the lungs every 6 (six) hours as needed for wheezing or shortness of breath. 6.7 g 2   atorvastatin (LIPITOR) 20 MG tablet Take 1 tablet (20 mg total) by mouth daily. NEEDS FOLLOW UP APPOINTMENT FOR MORE REFILLS 90 tablet 0   bictegravir-emtricitabine-tenofovir AF (BIKTARVY) 50-200-25 MG TABS tablet Take 1 tablet by mouth daily. 30 tablet 6   bisoprolol (ZEBETA) 10 MG tablet TAKE 1 TABLET BY MOUTH EVERY DAY 90 tablet 3   CVS ASPIRIN ADULT LOW DOSE 81 MG chewable tablet CHEW 1 TABLET BY MOUTH DAILY. 30 tablet 11   dapagliflozin propanediol (FARXIGA) 10 MG TABS tablet TAKE 1 TABLET BY MOUTH DAILY BEFORE BREAKFAST. 90 tablet 0   ENTRESTO 97-103 MG TAKE 1 TABLET BY MOUTH TWICE A DAY 180 tablet 3   fluticasone (FLONASE) 50 MCG/ACT nasal spray Place 2 sprays into both nostrils daily. 16 g 0   furosemide (LASIX) 80 MG tablet TAKE 1 TABLET BY MOUTH EVERY DAY 90 tablet 3    hydrOXYzine (ATARAX) 10 MG tablet Take 1 tablet (10 mg total) by mouth 3 (three) times daily as needed. 30 tablet 0   isosorbide-hydrALAZINE (BIDIL) 20-37.5 MG tablet Take 2 tablets by mouth 3 (three) times daily. NEEDS FOLLOW UP APPOINTMENT FOR MORE REFILLS 540 tablet 1   Semaglutide,0.25 or 0.5MG /DOS, (OZEMPIC, 0.25 OR 0.5 MG/DOSE,) 2 MG/1.5ML SOPN Inject 0.5 mg into the skin once a week. 1.5 mL 0   spironolactone (ALDACTONE) 25 MG tablet TAKE 1 TABLET BY MOUTH EVERY DAY 30 tablet 11   No facility-administered medications prior to visit.     Past Medical History:  Diagnosis Date   Asthma    Bronchitis    CHF (congestive heart failure) (HCC)    HIV (human immunodeficiency virus infection) (HCC)    Obstructive sleep apnea      Past Surgical History:  Procedure Laterality Date   RIGHT/LEFT HEART CATH AND CORONARY ANGIOGRAPHY N/A 09/21/2018   Procedure: RIGHT/LEFT HEART CATH AND CORONARY ANGIOGRAPHY;  Surgeon: Marykay Lex, MD;  Location: [facility] INVASIVE CV LAB;  Service: Cardiovascular;  Laterality: N/A;      Review of Systems    Objective:    There were no vitals taken for this visit. Nursing note and vital signs reviewed.  Physical Exam      06/26/2021    4:08 PM 02/27/2021    4:11 PM 05/31/2020  4:44 PM 02/01/2020    4:11 PM 10/21/2019    2:30 PM  Depression screen PHQ 2/9  Decreased Interest 0 0 1 0 1  Down, Depressed, Hopeless 0 1 1 1 1   PHQ - 2 Score 0 1 2 1 2   Altered sleeping   2  3  Tired, decreased energy   1  1  Change in appetite   1  0  Feeling bad or failure about yourself    1  2  Trouble concentrating   1  0  Moving slowly or fidgety/restless   1  1  Suicidal thoughts   0  0  PHQ-9 Score   9  9  Difficult doing work/chores   Not difficult at all  Somewhat difficult       Assessment & Plan:    Patient Active Problem List   Diagnosis Date Noted   Healthcare maintenance 11/09/2018   HIV disease (HCC) 09/22/2018   Dilated cardiomyopathy  (HCC) 09/21/2018   Acute combined systolic and diastolic heart failure (HCC)    Morbid obesity (HCC)    Volume overload 09/17/2018   Peripheral edema    Orthopnea    Cardiomegaly    Uncomplicated asthma      Problem List Items Addressed This Visit   None    I am having Todd Griffith maintain his albuterol, fluticasone, CVS Aspirin Adult Low Dose, spironolactone, Ozempic (0.25 or 0.5 MG/DOSE), bisoprolol, furosemide, Entresto, Biktarvy, Farxiga, hydrOXYzine, isosorbide-hydrALAZINE, and atorvastatin.   No orders of the defined types were placed in this encounter.    Follow-up: No follow-ups on file.   02-09-1971, MSN, FNP-C Nurse Practitioner [facility] Main number: [phone]

## 2021-10-16 NOTE — Patient Instructions (Addendum)
Nice to see you. ? ?We will check your lab work today. ? ?Continue to take your medication daily as prescribed. ? ?Refills have been sent to the pharmacy. ? ?Plan for follow up in 6 months or sooner if needed with lab work on the same day. ? ?Have a great day and stay safe! ? ?

## 2021-10-17 ENCOUNTER — Encounter: Payer: Self-pay | Admitting: Family

## 2021-10-17 DIAGNOSIS — Z5181 Encounter for therapeutic drug level monitoring: Secondary | ICD-10-CM | POA: Insufficient documentation

## 2021-10-17 LAB — T-HELPER CELL (CD4) - (RCID CLINIC ONLY)
CD4 % Helper T Cell: 48 % (ref 33–65)
CD4 T Cell Abs: 1231 /uL (ref 400–1790)

## 2021-10-17 NOTE — Assessment & Plan Note (Signed)
Mr. Todd Griffith continues to have well-controlled virus with good adherence and tolerance to [employer].  Reviewed previous lab work and discussed plan of care.  Check blood work today.  Continue current dose of Biktarvy.  Plan for follow-up in 6 months or sooner if needed with lab work on the same day.

## 2021-10-17 NOTE — Assessment & Plan Note (Signed)
   Discussed importance of safe sexual practices and condom use.  Condoms offered.  Has independent dentist and will follow-up for routine dental care.  Declines vaccinations.

## 2021-10-17 NOTE — Assessment & Plan Note (Signed)
Todd Griffith is up almost 10 pounds since his last office visit with BMI of 62. Concern may be fluid from heart failure as he has not followed up with cardiology. He is currently on Ozempic. Encouraged to continue with medication and lifestyle management and follow up with cardiology.

## 2021-10-17 NOTE — Assessment & Plan Note (Signed)
Most recent creatinine 1.06 with eGFR of 96. No evidence of nephrotoxicity. Continue to monitor with Biktarvy.

## 2021-10-19 LAB — COMPREHENSIVE METABOLIC PANEL
AG Ratio: 1.7 (calc) (ref 1.0–2.5)
ALT: 40 U/L (ref 9–46)
AST: 32 U/L (ref 10–40)
Albumin: 4.1 g/dL (ref 3.6–5.1)
Alkaline phosphatase (APISO): 74 U/L (ref 36–130)
BUN/Creatinine Ratio: 10 (calc) (ref 6–22)
BUN: 14 mg/dL (ref 7–25)
CO2: 25 mmol/L (ref 20–32)
Calcium: 9.1 mg/dL (ref 8.6–10.3)
Chloride: 108 mmol/L (ref 98–110)
Creat: 1.36 mg/dL — ABNORMAL HIGH (ref 0.60–1.26)
Globulin: 2.4 g/dL (calc) (ref 1.9–3.7)
Glucose, Bld: 81 mg/dL (ref 65–99)
Potassium: 4.2 mmol/L (ref 3.5–5.3)
Sodium: 139 mmol/L (ref 135–146)
Total Bilirubin: 0.7 mg/dL (ref 0.2–1.2)
Total Protein: 6.5 g/dL (ref 6.1–8.1)

## 2021-10-19 LAB — HIV-1 RNA QUANT-NO REFLEX-BLD
HIV 1 RNA Quant: NOT DETECTED Copies/mL
HIV-1 RNA Quant, Log: NOT DETECTED Log cps/mL

## 2021-10-19 LAB — RPR: RPR Ser Ql: NONREACTIVE

## 2021-10-29 ENCOUNTER — Ambulatory Visit (INDEPENDENT_AMBULATORY_CARE_PROVIDER_SITE_OTHER): Payer: BC Managed Care – PPO | Admitting: Nurse Practitioner

## 2021-10-29 ENCOUNTER — Encounter: Payer: Self-pay | Admitting: Nurse Practitioner

## 2021-10-29 VITALS — BP 140/85 | HR 88 | Temp 97.2°F | Resp 19 | Ht 70.0 in | Wt >= 6400 oz

## 2021-10-29 DIAGNOSIS — I420 Dilated cardiomyopathy: Secondary | ICD-10-CM | POA: Diagnosis not present

## 2021-10-29 DIAGNOSIS — R609 Edema, unspecified: Secondary | ICD-10-CM | POA: Diagnosis not present

## 2021-10-29 NOTE — Progress Notes (Unsigned)
@Patient  ID: Todd Griffith, male    DOB: 1990-09-03, 31 y.o.   MRN: [id number]  Chief Complaint  Patient presents with   Annual Exam    Needing accomodations to work from home.    Referring provider: [id number], NP   HPI  Patient presents today asking for accommodations to work from home.  Patient states that he is currently on Lasix which makes him have to go to the restroom often.  Patient states that his medications do make him nauseated at times as well.  He states that he would be more comfortable working from home.  Patient was recently seen by infectious disease and did have recent lab work done. Denies f/c/s, n/v/d, hemoptysis, PND, leg swelling Denies chest pain or edema     No Known Allergies  Immunization History  Administered Date(s) Administered   Influenza,inj,Quad PF,6+ Mos 02/27/2021   PPD Test 11/08/2011   Pneumococcal Conjugate-13 11/09/2018   Pneumococcal Polysaccharide-23 02/23/2019   Tdap 10/07/2018    Past Medical History:  Diagnosis Date   Asthma    Bronchitis    CHF (congestive heart failure) (HCC)    HIV (human immunodeficiency virus infection) (HCC)    Obstructive sleep apnea     Tobacco History: Social History   Tobacco Use  Smoking Status Former   Types: Cigarettes   Quit date: 05/31/2018   Years since quitting: 3.4  Smokeless Tobacco Never   Counseling given: Not Answered   Outpatient Encounter Medications as of 10/29/2021  Medication Sig   albuterol (VENTOLIN HFA) 108 (90 Base) MCG/ACT inhaler Inhale 1-2 puffs into the lungs every 6 (six) hours as needed for wheezing or shortness of breath.   atorvastatin (LIPITOR) 20 MG tablet Take 1 tablet (20 mg total) by mouth daily. NEEDS FOLLOW UP APPOINTMENT FOR MORE REFILLS   bictegravir-emtricitabine-tenofovir AF (BIKTARVY) 50-200-25 MG TABS tablet Take 1 tablet by mouth daily.   bisoprolol (ZEBETA) 10 MG tablet TAKE 1 TABLET BY MOUTH EVERY DAY   CVS ASPIRIN ADULT LOW DOSE 81  MG chewable tablet CHEW 1 TABLET BY MOUTH DAILY.   dapagliflozin propanediol (FARXIGA) 10 MG TABS tablet TAKE 1 TABLET BY MOUTH DAILY BEFORE BREAKFAST.   ENTRESTO 97-103 MG TAKE 1 TABLET BY MOUTH TWICE A DAY   fluticasone (FLONASE) 50 MCG/ACT nasal spray Place 2 sprays into both nostrils daily.   furosemide (LASIX) 80 MG tablet TAKE 1 TABLET BY MOUTH EVERY DAY   hydrOXYzine (ATARAX) 10 MG tablet Take 1 tablet (10 mg total) by mouth 3 (three) times daily as needed.   isosorbide-hydrALAZINE (BIDIL) 20-37.5 MG tablet Take 2 tablets by mouth 3 (three) times daily. NEEDS FOLLOW UP APPOINTMENT FOR MORE REFILLS   Semaglutide,0.25 or 0.5MG /DOS, (OZEMPIC, 0.25 OR 0.5 MG/DOSE,) 2 MG/1.5ML SOPN Inject 0.5 mg into the skin once a week.   spironolactone (ALDACTONE) 25 MG tablet TAKE 1 TABLET BY MOUTH EVERY DAY   No facility-administered encounter medications on file as of 10/29/2021.     Review of Systems  Review of Systems  Constitutional: Negative.   HENT: Negative.    Cardiovascular: Negative.   Gastrointestinal: Negative.   Allergic/Immunologic: Negative.   Neurological: Negative.   Psychiatric/Behavioral: Negative.         Physical Exam  BP 140/85 (BP Location: Left Arm, Patient Position: Sitting, Cuff Size: Large)   Pulse 88   Temp (!) 97.2 F (36.2 C)   Resp 19   Ht 5\' 10"  (1.778 m)   Wt (!) 431  lb (195.5 kg)   SpO2 97%   BMI 61.84 kg/m   Wt Readings from Last 5 Encounters:  10/29/21 (!) 431 lb (195.5 kg)  10/16/21 (!) 435 lb (197.3 kg)  07/02/21 (!) 423 lb 8 oz (192.1 kg)  06/26/21 (!) 430 lb (195 kg)  02/27/21 (!) 433 lb (196.4 kg)     Physical Exam Vitals and nursing note reviewed.  Constitutional:      General: He is not in acute distress.    Appearance: He is well-developed.  Cardiovascular:     Rate and Rhythm: Normal rate and regular rhythm.  Pulmonary:     Effort: Pulmonary effort is normal.     Breath sounds: Normal breath sounds.  Skin:    General: Skin  is warm and dry.  Neurological:     Mental Status: He is alert and oriented to person, place, and time.      Assessment & Plan:   Peripheral edema 2. Morbid obesity   3. Dilated cardiomyopathy    Will give patient a work note so that hopefully he will be able to get work accommodations and work from home.   Follow up:  Follow up as scheduled     Ivonne Andrew, NP 11/01/2021

## 2021-11-01 ENCOUNTER — Encounter: Payer: Self-pay | Admitting: Nurse Practitioner

## 2021-11-01 NOTE — Patient Instructions (Signed)
1. Peripheral edema  2. Morbid obesity   3. Dilated cardiomyopathy    Will give patient a work note so that hopefully he will be able to get work accommodations and work from home.   Follow up:  Follow up as scheduled

## 2021-11-01 NOTE — Assessment & Plan Note (Signed)
2. Morbid obesity   3. Dilated cardiomyopathy    Will give patient a work note so that hopefully he will be able to get work accommodations and work from home.   Follow up:  Follow up as scheduled

## 2021-11-07 ENCOUNTER — Encounter (INDEPENDENT_AMBULATORY_CARE_PROVIDER_SITE_OTHER): Payer: Self-pay

## 2021-11-28 ENCOUNTER — Ambulatory Visit: Payer: Self-pay | Admitting: Nurse Practitioner

## 2021-12-16 ENCOUNTER — Other Ambulatory Visit (HOSPITAL_COMMUNITY): Payer: Self-pay | Admitting: Cardiology

## 2021-12-21 ENCOUNTER — Other Ambulatory Visit (HOSPITAL_COMMUNITY): Payer: Self-pay | Admitting: Cardiology

## 2021-12-27 ENCOUNTER — Telehealth (HOSPITAL_COMMUNITY): Payer: Self-pay

## 2021-12-27 NOTE — Telephone Encounter (Signed)
Called and left patient a voice message to confirm/remind patient of their appointment at the [facility] on 12/28/21.   Patient reminded to bring all medications and/or complete list.

## 2021-12-28 ENCOUNTER — Ambulatory Visit (HOSPITAL_COMMUNITY)
Admission: RE | Admit: 2021-12-28 | Discharge: 2021-12-28 | Disposition: A | Discharge status: Home / Self Care | Payer: BC Managed Care – PPO | Source: Ambulatory Visit | Attending: Family Medicine | Admitting: Family Medicine

## 2021-12-28 ENCOUNTER — Encounter (HOSPITAL_COMMUNITY): Payer: Self-pay

## 2021-12-28 VITALS — BP 162/114 | HR 85 | Ht 70.0 in | Wt >= 6400 oz

## 2021-12-28 DIAGNOSIS — Z21 Asymptomatic human immunodeficiency virus [HIV] infection status: Secondary | ICD-10-CM | POA: Diagnosis not present

## 2021-12-28 DIAGNOSIS — Z7982 Long term (current) use of aspirin: Secondary | ICD-10-CM | POA: Insufficient documentation

## 2021-12-28 DIAGNOSIS — I340 Nonrheumatic mitral (valve) insufficiency: Secondary | ICD-10-CM | POA: Diagnosis not present

## 2021-12-28 DIAGNOSIS — I2510 Atherosclerotic heart disease of native coronary artery without angina pectoris: Secondary | ICD-10-CM | POA: Diagnosis not present

## 2021-12-28 DIAGNOSIS — F1010 Alcohol abuse, uncomplicated: Secondary | ICD-10-CM | POA: Insufficient documentation

## 2021-12-28 DIAGNOSIS — J45909 Unspecified asthma, uncomplicated: Secondary | ICD-10-CM | POA: Insufficient documentation

## 2021-12-28 DIAGNOSIS — Z6841 Body Mass Index (BMI) 40.0 and over, adult: Secondary | ICD-10-CM | POA: Diagnosis not present

## 2021-12-28 DIAGNOSIS — Z7984 Long term (current) use of oral hypoglycemic drugs: Secondary | ICD-10-CM | POA: Insufficient documentation

## 2021-12-28 DIAGNOSIS — I10 Essential (primary) hypertension: Secondary | ICD-10-CM

## 2021-12-28 DIAGNOSIS — I110 Hypertensive heart disease with heart failure: Secondary | ICD-10-CM | POA: Diagnosis not present

## 2021-12-28 DIAGNOSIS — G4733 Obstructive sleep apnea (adult) (pediatric): Secondary | ICD-10-CM | POA: Insufficient documentation

## 2021-12-28 DIAGNOSIS — Z79899 Other long term (current) drug therapy: Secondary | ICD-10-CM | POA: Insufficient documentation

## 2021-12-28 DIAGNOSIS — I5022 Chronic systolic (congestive) heart failure: Secondary | ICD-10-CM | POA: Diagnosis not present

## 2021-12-28 DIAGNOSIS — I428 Other cardiomyopathies: Secondary | ICD-10-CM | POA: Insufficient documentation

## 2021-12-28 LAB — LIPID PANEL
Cholesterol: 140 mg/dL (ref 0–200)
HDL: 28 mg/dL — ABNORMAL LOW (ref >40)
LDL Cholesterol: 88 mg/dL (ref 0–99)
Total CHOL/HDL Ratio: 5 RATIO
Triglycerides: 120 mg/dL (ref <150)
VLDL: 24 mg/dL (ref 0–40)

## 2021-12-28 LAB — BASIC METABOLIC PANEL
Anion gap: 10 (ref 5–15)
BUN: 7 mg/dL (ref 6–20)
CO2: 26 mmol/L (ref 22–32)
Calcium: 9.2 mg/dL (ref 8.9–10.3)
Chloride: 103 mmol/L (ref 98–111)
Creatinine, Ser: 1.24 mg/dL (ref 0.61–1.24)
GFR, Estimated: >60 mL/min (ref >60)
Glucose, Bld: 81 mg/dL (ref 70–99)
Potassium: 3.6 mmol/L (ref 3.5–5.1)
Sodium: 139 mmol/L (ref 135–145)

## 2021-12-28 LAB — BRAIN NATRIURETIC PEPTIDE: B Natriuretic Peptide: 1.9 pg/mL (ref 0.0–100.0)

## 2021-12-28 MED ORDER — FUROSEMIDE 80 MG PO TABS: 80.0000 mg | ORAL_TABLET | Freq: Every day | ORAL | 2 refills | Status: DC

## 2021-12-28 MED ORDER — ENTRESTO 97-103 MG PO TABS: 1.0000 | ORAL_TABLET | Freq: Two times a day (BID) | ORAL | 2 refills | Status: DC

## 2021-12-28 MED ORDER — SPIRONOLACTONE 25 MG PO TABS: 25.0000 mg | ORAL_TABLET | Freq: Every day | ORAL | 11 refills | Status: DC

## 2021-12-28 MED ORDER — DAPAGLIFLOZIN PROPANEDIOL 10 MG PO TABS: 10.0000 mg | ORAL_TABLET | Freq: Every day | ORAL | 1 refills | Status: DC

## 2021-12-28 MED ORDER — ISOSORB DINITRATE-HYDRALAZINE 20-37.5 MG PO TABS: 2.0000 | ORAL_TABLET | Freq: Three times a day (TID) | ORAL | 1 refills | Status: DC

## 2021-12-28 NOTE — Progress Notes (Signed)
PCP: Vevelyn Francois, NP Cardiology: Dr. Aundra Dubin  31 y.o. with history of asthma, OSA, HIV+, and nonischemic cardiomyopathy returns for followup of CHF.  He was admitted in 6/20 with dyspnea and found to have CHF.  Echo showed EF 15-20% with diffuse hypokinesis, moderate-severe MR. LHC/RHC showed mild coronary disease, preserved cardiac output.  He was also diagnosed with HIV and started on Biktarvy.   Echo in 10/20 showed EF up to 35-40%, MR is only trivial.  Echo in 2/21 showed EF 35-40% with normal RV.  Echo 7/23 showed EF up to 45% with normal RV.   Today he returns for HF follow up. We have not seen him since 09/2020. Overall feeling fine. Continues to lose weight, playing basketball and no longer eating meat. He has no SOB playing basketball or running 1 mile/day on TM. Denies palpitations, CP, dizziness, edema, or PND/Orthopnea. Appetite ok. No fever or chills. Does not weigh at home. Taking all medications. Has productive cough. Has CPAP but not using regularly. Works at [employer] [place]. Drinks occasional ETOH, no tobacco/drug use.  ECG (personally reviewed): NSR 80 bpm  Labs (9/20): K 4.2, creatinine 1.48 Labs (10/20): LDL 73, HDL 26 Labs (11/20): K 4.1, creatinine 1.39 Labs (12/20): K 4.2, creatinine 1.43 Labs (1/22): K 3.8, creatinine 1.32, LDL 58, HDL 25 Labs (4/22): K 3.8, creatinine 1.4 Labs (7/23): K 4.2, creatinine 1.36, LFTs normal   PMH: 1. Chronic systolic CHF: Nonischemic cardiomyopathy.   - LHC/RHC (6/20): mean RA 4, mean PCWP 21, CI 3; 70% D1 stenosis.  - Echo (6/20): EF 15-20%, normal RV, moderate-severe MR.  - Echo (10/20): EF 35-40% with diffuse hypokinesis, normal RV, trivial MR.   - Echo (2/21): EF 35-40%, diffuse hypokinesis, normal RV, normal IVC - Echo (7/22): EF 45%, normal RV.  2. HIV+: Followed by ID.  3. H/o ETOH abuse.  4. Asthma: Since childhood.  5. HTN 6. Morbid obesity.  7. OSA: Uses CPAP.  8. CAD: LHC in 6/20 with 70% D1 stenosis.   FH: Father,  grandf ather with CAD, no cardiomyopathy.   SH: Lives in [place] with girlfriend and child, works for [employer] [place].  Nonsmoker.  Prior heavy ETOH, now drinks some on weekend but much less. Nonsmoker, no drugs.   ROS: All systems reviewed and negative except as per HPI.   Current Outpatient Medications  Medication Sig Dispense Refill   albuterol (VENTOLIN HFA) 108 (90 Base) MCG/ACT inhaler Inhale 1-2 puffs into the lungs every 6 (six) hours as needed for wheezing or shortness of breath. 6.7 g 2   atorvastatin (LIPITOR) 20 MG tablet TAKE 1 TABLET (20 MG TOTAL) BY MOUTH DAILY. NEEDS FOLLOW UP APPOINTMENT FOR MORE REFILLS 60 tablet 0   bictegravir-emtricitabine-tenofovir AF (BIKTARVY) 50-200-25 MG TABS tablet Take 1 tablet by mouth daily. 30 tablet 6   bisoprolol (ZEBETA) 10 MG tablet TAKE 1 TABLET BY MOUTH EVERY DAY 90 tablet 3   CVS ASPIRIN ADULT LOW DOSE 81 MG chewable tablet CHEW 1 TABLET BY MOUTH DAILY. 30 tablet 11   dapagliflozin propanediol (FARXIGA) 10 MG TABS tablet TAKE 1 TABLET BY MOUTH DAILY BEFORE BREAKFAST. 90 tablet 0   ENTRESTO 97-103 MG TAKE 1 TABLET BY MOUTH TWICE A DAY 180 tablet 3   fluticasone (FLONASE) 50 MCG/ACT nasal spray Place 2 sprays into both nostrils daily. 16 g 0   furosemide (LASIX) 80 MG tablet TAKE 1 TABLET BY MOUTH EVERY DAY 90 tablet 3   hydrOXYzine (ATARAX) 10  MG tablet Take 1 tablet (10 mg total) by mouth 3 (three) times daily as needed. 30 tablet 0   isosorbide-hydrALAZINE (BIDIL) 20-37.5 MG tablet Take 2 tablets by mouth 3 (three) times daily. NEEDS FOLLOW UP APPOINTMENT FOR MORE REFILLS 540 tablet 1   spironolactone (ALDACTONE) 25 MG tablet TAKE 1 TABLET BY MOUTH EVERY DAY 30 tablet 11   No current facility-administered medications for this encounter.   Wt Readings from Last 3 Encounters:  12/28/21 (!) 183.7 kg (405 lb)  10/29/21 (!) 195.5 kg (431 lb)  10/16/21 (!) 197.3 kg (435 lb)   BP (!) 162/114   Pulse 85   Ht 5\' 10"  (1.778 m)   Wt (!)  183.7 kg (405 lb)   SpO2 96%   BMI 58.11 kg/m  Physical Exam General:  NAD. No resp difficulty, walked into clinic HEENT: Normal Neck: Supple. Thick neck. Carotids 2+ bilat; no bruits. No lymphadenopathy or thryomegaly appreciated. Cor: PMI nondisplaced. Regular rate & rhythm. No rubs, gallops or murmurs. Lungs: Clear Abdomen: super morbid obese, soft, nontender, nondistended. No hepatosplenomegaly. No bruits or masses. Good bowel sounds. Extremities: No cyanosis, clubbing, rash, edema Neuro: Alert & oriented x 3, cranial nerves grossly intact. Moves all 4 extremities w/o difficulty. Affect pleasant.  Assessment/Plan: 1. Chronic systolic CHF: Nonischemic cardiomyopathy.  Echo in 6/20 showed EF 15-20% with moderate-severe MR.  RHC/LHC indicated nonischemic cardiomyopathy, cardiac output preserved. Cause of cardiomyopathy is uncertain, he was a heavy drinker in the past.  He has also been found to have HIV, which can cause a cardiomyopathy, but he has been on meds for this and it is controlled.  Echo in 10/20 showed EF improved up to 35-40%.  Echo in 2/21 showed EF 35-40% still.  Echo 7/22 showed EF up to 45%.  He is not volume overloaded on exam, stable NYHA class I.  - Continue bisoprolol 10 mg daily.  - Continue Entresto 97/103 mg bid.  - Continue spironolactone 25 mg daily.  - Continue Bidil 2 tabs tid.   - Continue dapagliflozin 10 mg daily. No GU symptoms. - Continue Lasix 80 daily.  BMET/BNP today.   - EF is outside of ICD range.      - I do not think that he would fit in scanner for cardiac MRI.  - Repeat echo in 6 months to ensure EF stability. 2. CAD: LHC (6/20) showed 70% D1 stenosis.   - Continue ASA 81 daily.  - Continue atorvastatin 20 mg daily. Good lipids in 1/22. Check lipids today. 3. Mitral regurgitation: Much improved on most recent echoes.  4. ETOH abuse: Prior ETOH abuse may have contributed to his cardiomyopathy. He has cut back considerably.  5. OSA: Needs to use  CPAP.  6. Obesity: Body mass index is 58.11 kg/m. He is interested in bariatric surgery.  - He needs to re-establish at bariatric program at [facility].   - Previously on Ozempic and tolerated well. He is down 25 lbs. Discussed re-starting today but he would like to continue diet and lifestyle changes. I asked him to reach out if he changes his mind and will be happy to re-order. 7. HTN: BP elevated today but he has not had AM meds, generally well-controlled. - No med changes today. I asked him to take his medication as soon as he gets back home.  Follow up in 6 months with Dr. 2/22 + echo. Refill all cardiac meds.  Shirlee Latch [facility] FNP-BC 12/28/2021

## 2021-12-28 NOTE — Patient Instructions (Addendum)
Thank you for coming in today  Labs were done today, if any labs are abnormal the clinic will call you No news is good news  Your heart failure medications have been refilled  Your physician recommends that you schedule a follow-up appointment in:  6 months with Dr. Aundra Dubin with echocardiogram  Your physician has requested that you have an echocardiogram. Echocardiography is a painless test that uses sound waves to create images of your heart. It provides your doctor with information about the size and shape of your heart and how well your heart's chambers and valves are working. This procedure takes approximately one hour. There are no restrictions for this procedure.     Do the following things EVERYDAY: Weigh yourself in the morning before breakfast. Write it down and keep it in a log. Take your medicines as prescribed Eat low salt foods--Limit salt (sodium) to 2000 mg per day.  Stay as active as you can everyday Limit all fluids for the day to less than 2 liters  At the [facility], you and your health needs are our priority. As part of our continuing mission to provide you with exceptional heart care, we have created designated Provider Care Teams. These Care Teams include your primary Cardiologist (physician) and Advanced Practice Providers (APPs- Physician Assistants and Nurse Practitioners) who all work together to provide you with the care you need, when you need it.   You may see any of the following providers on your designated Care Team at your next follow up: Dr Glori Bickers Dr Loralie Champagne Dr. Roxana Hires, NP Lyda Jester, [place] [facility] [place], [place] Forestine Na, NP Audry Riles, PharmD   Please be sure to bring in all your medications bottles to every appointment.   If you have any questions or concerns before your next appointment please send [place] a message through [place] or call our office at [phone].    TO  LEAVE A MESSAGE FOR THE NURSE SELECT OPTION 2, PLEASE LEAVE A MESSAGE INCLUDING: YOUR NAME DATE OF BIRTH CALL BACK NUMBER REASON FOR CALL**this is important as we prioritize the call backs  YOU WILL RECEIVE A CALL BACK THE SAME DAY AS LONG AS YOU CALL BEFORE 4:00 PM

## 2022-01-11 ENCOUNTER — Other Ambulatory Visit (HOSPITAL_COMMUNITY): Payer: Self-pay | Admitting: Cardiology

## 2022-01-15 ENCOUNTER — Other Ambulatory Visit (HOSPITAL_COMMUNITY): Payer: Self-pay | Admitting: Cardiology

## 2022-03-12 ENCOUNTER — Telehealth: Payer: Self-pay

## 2022-03-12 NOTE — Patient Outreach (Signed)
  Care Coordination   03/12/2022 Name: Todd Griffith [facility] MRN: [id number] DOB: 1990/07/30   Care Coordination Outreach Attempts:  An unsuccessful telephone outreach was attempted today to offer the patient information about available care coordination services as a benefit of their health plan.   Follow Up Plan:  Additional outreach attempts will be made to offer the patient care coordination information and services.   Encounter Outcome:  No Answer   Care Coordination Interventions:  No, not indicated    Dudley Major RN, BSN,CCM, CDE Care Management Coordinator [facility] Care Management [phone]

## 2022-03-20 ENCOUNTER — Telehealth: Payer: Self-pay

## 2022-03-20 ENCOUNTER — Other Ambulatory Visit: Payer: Self-pay | Admitting: Nurse Practitioner

## 2022-03-20 NOTE — Patient Outreach (Signed)
  Care Coordination   03/20/2022 Name: Todd Griffith [facility] MRN: [id number] DOB: [birth year]   Care Coordination Outreach Attempts:  A second unsuccessful outreach was attempted today to offer the patient with information about available care coordination services as a benefit of their health plan.     Follow Up Plan:  Additional outreach attempts will be made to offer the patient care coordination information and services.   Encounter Outcome:  No Answer   Care Coordination Interventions:  No, not indicated   Dudley Major RN, BSN,CCM, CDE Care Management Coordinator [facility] Care Management [phone]

## 2022-04-04 ENCOUNTER — Other Ambulatory Visit: Payer: Self-pay | Admitting: Pharmacist

## 2022-04-04 DIAGNOSIS — B20 Human immunodeficiency virus [HIV] disease: Secondary | ICD-10-CM

## 2022-04-04 MED ORDER — BIKTARVY 50-200-25 MG PO TABS: 1.0000 | ORAL_TABLET | Freq: Every day | ORAL | 0 refills | Status: AC

## 2022-04-04 NOTE — Progress Notes (Signed)
Medication Samples have been provided to the patient.  Drug name: Biktarvy        Strength: 50/200/25 mg       Qty: 7 tablets (1 bottle)   LOT: [id number]   Exp.Date: 05/2024  Dosing instructions: Take one tablet by mouth once daily  The patient has been instructed regarding the correct time, dose, and frequency of taking this medication, including desired effects and most common side effects.   Kabe Mckoy L. Hedaya Latendresse, PharmD, BCIDP, AAHIVP, CPP Clinical Pharmacist Practitioner Infectious Diseases Clinical Pharmacist [facility] 03/13/2020, 10:07 AM  

## 2022-04-12 ENCOUNTER — Encounter: Payer: Self-pay | Admitting: Nurse Practitioner

## 2022-04-12 ENCOUNTER — Ambulatory Visit (INDEPENDENT_AMBULATORY_CARE_PROVIDER_SITE_OTHER): Payer: BC Managed Care – PPO | Admitting: Nurse Practitioner

## 2022-04-12 VITALS — BP 128/66 | HR 90 | Ht 70.0 in | Wt >= 6400 oz

## 2022-04-12 DIAGNOSIS — F419 Anxiety disorder, unspecified: Secondary | ICD-10-CM | POA: Diagnosis not present

## 2022-04-12 DIAGNOSIS — F32A Depression, unspecified: Secondary | ICD-10-CM

## 2022-04-12 DIAGNOSIS — Z6841 Body Mass Index (BMI) 40.0 and over, adult: Secondary | ICD-10-CM | POA: Diagnosis not present

## 2022-04-12 MED ORDER — ALBUTEROL SULFATE HFA 108 (90 BASE) MCG/ACT IN AERS: 1.0000 | INHALATION_SPRAY | Freq: Four times a day (QID) | RESPIRATORY_TRACT | 2 refills | Status: AC | PRN

## 2022-04-12 NOTE — Progress Notes (Unsigned)
@Patient  ID: Todd Griffith, male    DOB: 1990/08/21, 32 y.o.   MRN: [id number]  Chief Complaint  Patient presents with   Depression    Pt stated--depression and needed to talk to someone    Referring provider: No ref. provider found   HPI  Patient presents today for anxiety and depression.  He states that he had had a recent loss in the family.  Patient states that he has had thoughts of suicide but has no plan in place.  We did call Estanislado Emms social worker/counselor into the office to speak with patient.  She will follow-up with him on Monday for counseling.  Patient does not want to be on medication at this point.  We will also place a referral to psychiatry in case he does need medication management.  Will place referral for weight management per patient request. Denies f/c/s, n/v/d, hemoptysis, PND, leg swelling Denies chest pain or edema      No Known Allergies  Immunization History  Administered Date(s) Administered   Influenza,inj,Quad PF,6+ Mos 02/27/2021   PPD Test 11/08/2011   Pneumococcal Conjugate-13 11/09/2018   Pneumococcal Polysaccharide-23 02/23/2019   Tdap 10/07/2018    Past Medical History:  Diagnosis Date   Asthma    Bronchitis    CHF (congestive heart failure) ([place])    HIV (human immunodeficiency virus infection) ([place])    Obstructive sleep apnea     Tobacco History: Social History   Tobacco Use  Smoking Status Former   Types: Cigarettes   Quit date: 05/31/2018   Years since quitting: 3.8  Smokeless Tobacco Never   Counseling given: Not Answered   Outpatient Encounter Medications as of 04/12/2022  Medication Sig   atorvastatin (LIPITOR) 20 MG tablet Take 1 tablet (20 mg total) by mouth daily. NEEDS FOLLOW UP APPOINTMENT FOR MORE REFILLS   bictegravir-emtricitabine-tenofovir AF (BIKTARVY) 50-200-25 MG TABS tablet Take 1 tablet by mouth daily.   bisoprolol (ZEBETA) 10 MG tablet TAKE 1 TABLET BY MOUTH EVERY DAY   CVS ASPIRIN ADULT  LOW DOSE 81 MG chewable tablet CHEW 1 TABLET BY MOUTH DAILY.   dapagliflozin propanediol (FARXIGA) 10 MG TABS tablet Take 1 tablet (10 mg total) by mouth daily before breakfast.   fluticasone (FLONASE) 50 MCG/ACT nasal spray Place 2 sprays into both nostrils daily.   furosemide (LASIX) 80 MG tablet Take 1 tablet (80 mg total) by mouth daily.   isosorbide-hydrALAZINE (BIDIL) 20-37.5 MG tablet Take 2 tablets by mouth 3 (three) times daily.   sacubitril-valsartan (ENTRESTO) 97-103 MG Take 1 tablet by mouth 2 (two) times daily.   spironolactone (ALDACTONE) 25 MG tablet Take 1 tablet (25 mg total) by mouth daily.   [DISCONTINUED] albuterol (VENTOLIN HFA) 108 (90 Base) MCG/ACT inhaler Inhale 1-2 puffs into the lungs every 6 (six) hours as needed for wheezing or shortness of breath.   albuterol (VENTOLIN HFA) 108 (90 Base) MCG/ACT inhaler Inhale 1-2 puffs into the lungs every 6 (six) hours as needed for wheezing or shortness of breath.   [DISCONTINUED] hydrOXYzine (ATARAX) 10 MG tablet Take 1 tablet (10 mg total) by mouth 3 (three) times daily as needed.   No facility-administered encounter medications on file as of 04/12/2022.     Review of Systems  Review of Systems  Constitutional: Negative.   HENT: Negative.    Cardiovascular: Negative.   Gastrointestinal: Negative.   Allergic/Immunologic: Negative.   Neurological: Negative.   Psychiatric/Behavioral:  The patient is nervous/anxious.  Physical Exam  BP 128/66   Pulse 90   Ht 5\' 10"  (1.778 m)   Wt (!) 430 lb (195 kg)   SpO2 98%   BMI 61.70 kg/m   Wt Readings from Last 5 Encounters:  04/12/22 (!) 430 lb (195 kg)  12/28/21 (!) 405 lb (183.7 kg)  10/29/21 (!) 431 lb (195.5 kg)  10/16/21 (!) 435 lb (197.3 kg)  07/02/21 (!) 423 lb 8 oz (192.1 kg)     Physical Exam Vitals and nursing note reviewed.  Constitutional:      General: He is not in acute distress.    Appearance: He is well-developed.  Cardiovascular:     Rate  and Rhythm: Normal rate and regular rhythm.  Pulmonary:     Effort: Pulmonary effort is normal.     Breath sounds: Normal breath sounds.  Skin:    General: Skin is warm and dry.  Neurological:     Mental Status: He is alert and oriented to person, place, and time.      Lab Results:  CBC    Component Value Date/Time   WBC 7.0 02/16/2021 1654   RBC 3.87 (L) 02/16/2021 1654   HGB 12.6 (L) 02/16/2021 1654   HGB 13.7 09/22/2018 1129   HCT 37.8 (L) 02/16/2021 1654   PLT 209 02/16/2021 1654   MCV 97.7 02/16/2021 1654   MCH 32.6 02/16/2021 1654   MCHC 33.3 02/16/2021 1654   RDW 13.4 02/16/2021 1654   LYMPHSABS 1.6 02/16/2021 1654   MONOABS 0.8 02/16/2021 1654   EOSABS 0.4 02/16/2021 1654   BASOSABS 0.0 02/16/2021 1654    BMET    Component Value Date/Time   NA 139 12/28/2021 1006   K 3.6 12/28/2021 1006   CL 103 12/28/2021 1006   CO2 26 12/28/2021 1006   GLUCOSE 81 12/28/2021 1006   BUN 7 12/28/2021 1006   CREATININE 1.24 12/28/2021 1006   CREATININE 1.36 (H) 10/16/2021 1641   CALCIUM 9.2 12/28/2021 1006   GFRNONAA >60 12/28/2021 1006   GFRNONAA 58 (L) 10/12/2018 1647   GFRAA >60 11/18/2019 1139   GFRAA 67 10/12/2018 1647    BNP    Component Value Date/Time   BNP 1.9 12/28/2021 1006    ProBNP No results found for: "PROBNP"  Imaging: No results found.   Assessment & Plan:   Anxiety and depression - Ambulatory referral to Psychiatry  -will schedule counseling with susan this Monday morning  2. Class 3 severe obesity due to excess calories without serious comorbidity with body mass index (BMI) of 60.0 to 69.9 in adult (HCC)  - Amb Ref to Medical Weight Management  Follow up:  Follow up in 3 months or sooner if needed     Fenton Foy, NP 04/15/2022

## 2022-04-12 NOTE — Progress Notes (Unsigned)
Integrated Behavioral Health Case Management Referral Note  04/12/2022 Name: Todd Griffith [facility] MRN: [id number] DOB: 14-Jan-1991 Todd Griffith is a 32 y.o. year old male who sees Todd Foy, NP for primary care. LCSW was consulted to assess patient's needs and assist the patient with Mental Health Counseling and Resources.  Interpreter: No.   Interpreter Name & Language: none  Assessment: Patient experiencing Mental Health Concerns .   Intervention: CSW met with patient during PCP visit. Patient experiencing thoughts that he would be better off dead. He denies a plan or intent for suicide. He wants to be here for his young children. Patient is agreeable to counseling. Scheduled Integrated Behavioral Health (IBH) appointment for 04/15/22.  Review of patient status, including review of consultants reports, relevant laboratory and other test results, and collaboration with appropriate care team members and the patient's provider was performed as part of comprehensive patient evaluation and provision of services.    Estanislado Emms, [facility] [phone]

## 2022-04-12 NOTE — Patient Instructions (Signed)
1. Anxiety and depression  - Ambulatory referral to Psychiatry  -will schedule counseling with susan this Monday morning  2. Class 3 severe obesity due to excess calories without serious comorbidity with body mass index (BMI) of 60.0 to 69.9 in adult (HCC)  - Amb Ref to Medical Weight Management  Follow up:  Follow up in 3 months or sooner if needed    Managing Depression, Adult Depression is a mental health condition that affects your thoughts, feelings, and actions. Being diagnosed with depression can bring you relief if you did not know why you have felt or behaved a certain way. It could also leave you feeling overwhelmed. Finding ways to manage your symptoms can help you feel more positive about your future. How to manage lifestyle changes Being depressed is difficult. Depression can increase the level of everyday stress. Stress can make depression symptoms worse. You may believe your symptoms cannot be managed or will never improve. However, there are many things you can try to help manage your symptoms. There is hope. Managing stress  Stress is your body's reaction to life changes and events, both good and bad. Stress can add to your feelings of depression. Learning to manage your stress can help lessen your feelings of depression. Try some of the following approaches to reducing your stress (stress reduction techniques): Listen to music that you enjoy and that inspires you. Try using a meditation app or take a meditation class. Develop a practice that helps you connect with your spiritual self. Walk in nature, pray, or go to a place of worship. Practice deep breathing. To do this, inhale slowly through your nose. Pause at the top of your inhale for a few seconds and then exhale slowly, letting yourself relax. Repeat this three or four times. Practice yoga to help relax and work your muscles. Choose a stress reduction technique that works for you. These techniques take time and  practice to develop. Set aside 5-15 minutes a day to do them. Therapists can offer training in these techniques. Do these things to help manage stress: Keep a journal. Know your limits. Set healthy boundaries for yourself and others, such as saying "no" when you think something is too much. Pay attention to how you react to certain situations. You may not be able to control everything, but you can change your reaction. Add humor to your life by watching funny movies or shows. Make time for activities that you enjoy and that relax you. Spend less time using electronics, especially at night before bed. The light from screens can make your brain think it is time to get up rather than go to bed.  Medicines Medicines, such as antidepressants, are often a part of treatment for depression. Talk with your pharmacist or health care provider about all the medicines, supplements, and herbal products that you take, their possible side effects, and what medicines and other products are safe to take together. Make sure to report any side effects you may have to your health care provider. Relationships Your health care provider may suggest family therapy, couples therapy, or individual therapy as part of your treatment. How to recognize changes Everyone responds differently to treatment for depression. As you recover from depression, you may start to: Have more interest in doing activities. Feel more hopeful. Have more energy. Eat a more regular amount of food. Have better mental focus. It is important to recognize if your depression is not getting better or is getting worse. The symptoms you had  in the beginning may return, such as: Feeling tired. Eating too much or too little. Sleeping too much or too little. Feeling restless, agitated, or hopeless. Trouble focusing or making decisions. Having unexplained aches and pains. Feeling irritable, angry, or aggressive. If you or your family members notice  these symptoms coming back, let your health care provider know right away. Follow these instructions at home: Activity Try to get some form of exercise each day, such as walking. Try yoga, mindfulness, or other stress reduction techniques. Participate in group activities if you are able. Lifestyle Get enough sleep. Cut down on or stop using caffeine, tobacco, alcohol, and any other harmful substances. Eat a healthy diet that includes plenty of vegetables, fruits, whole grains, low-fat dairy products, and lean protein. Limit foods that are high in solid fats, added sugar, or salt (sodium). General instructions Take over-the-counter and prescription medicines only as told by your health care provider. Keep all follow-up visits. It is important for your health care provider to check on your mood, behavior, and medicines. Your health care provider may need to make changes to your treatment. Where to find support Talking to others  Friends and family members can be sources of support and guidance. Talk to trusted friends or family members about your condition. Explain your symptoms and let them know that you are working with a health care provider to treat your depression. Tell friends and family how they can help. Finances Find mental health providers that fit with your financial situation. Talk with your health care provider if you are worried about access to food, housing, or medicine. Call your insurance company to learn about your co-pays and prescription plan. Where to find more information You can find support in your area from: Anxiety and Depression Association of America (ADAA): adaa.org Mental Health America: mentalhealthamerica.net [employer] on Mental Illness: nami.org Contact a health care provider if: You stop taking your antidepressant medicines, and you have any of these symptoms: Nausea. Headache. Light-headedness. Chills and body aches. Not being able to sleep  (insomnia). You or your friends and family think your depression is getting worse. Get help right away if: You have thoughts of hurting yourself or others. Get help right away if you feel like you may hurt yourself or others, or have thoughts about taking your own life. Go to your nearest emergency room or: Call 911. Call the [facility] at 7313460566 or 988. This is open 24 hours a day. Text the Crisis Text Line at 662-167-9598. This information is not intended to replace advice given to you by your health care provider. Make sure you discuss any questions you have with your health care provider. Document Revised: 07/24/2021 Document Reviewed: 07/24/2021 Elsevier Patient Education  [place].

## 2022-04-13 ENCOUNTER — Other Ambulatory Visit: Payer: Self-pay | Admitting: Nurse Practitioner

## 2022-04-15 ENCOUNTER — Encounter: Payer: Self-pay | Admitting: Nurse Practitioner

## 2022-04-15 ENCOUNTER — Encounter (INDEPENDENT_AMBULATORY_CARE_PROVIDER_SITE_OTHER): Payer: Self-pay

## 2022-04-15 ENCOUNTER — Institutional Professional Consult (permissible substitution): Payer: Self-pay | Admitting: Clinical

## 2022-04-15 DIAGNOSIS — F419 Anxiety disorder, unspecified: Secondary | ICD-10-CM | POA: Insufficient documentation

## 2022-04-15 NOTE — Assessment & Plan Note (Signed)
-  Ambulatory referral to Psychiatry  -will schedule counseling with susan this Monday morning  2. Class 3 severe obesity due to excess calories without serious comorbidity with body mass index (BMI) of 60.0 to 69.9 in adult (HCC)  - Amb Ref to Medical Weight Management  Follow up:  Follow up in 3 months or sooner if needed

## 2022-04-18 ENCOUNTER — Ambulatory Visit (INDEPENDENT_AMBULATORY_CARE_PROVIDER_SITE_OTHER): Payer: Self-pay | Admitting: Clinical

## 2022-04-18 DIAGNOSIS — F3289 Other specified depressive episodes: Secondary | ICD-10-CM

## 2022-04-18 NOTE — BH Specialist Note (Signed)
Integrated Behavioral Health Initial In-Person Visit  MRN: [id number] Name: Todd Griffith  Number of [place] Clinician visits: 1- Initial Visit  Session Start time: 9678    Session End time: 1120  Total time in minutes: 60   Types of Service: Individual psychotherapy  Interpretor:No. Interpretor Name and Language: none  Subjective: [name] Service is a 32 y.o. male accompanied by  self. Patient was referred by Lazaro Arms, NP for depression. Patient reports the following symptoms/concerns: depression Duration of problem: several months; Severity of problem: moderate  Objective: Mood: Euthymic and Affect: Appropriate Risk of harm to self or others: No plan to harm self or others  Life Context: Family and Social: Currently living alone, will have a roommate soon. Has friends in the area. Most family lives in [place], pt visits them nearly every week. School/Work: works full time Self-Care: playing basketball Life Changes: weight management  Patient and/or Family's Strengths/Protective Factors: Social connections, Social and Patent attorney, Concrete supports in place (healthy food, safe environments, etc.), and Sense of purpose  Goals Addressed: Patient will: Reduce symptoms of: depression Increase knowledge and/or ability of: coping skills, healthy habits, and self-management skills  Demonstrate ability to: Increase healthy adjustment to current life circumstances  Progress towards Goals: Ongoing  Interventions: Interventions utilized: CBT Cognitive Behavioral Therapy and Supportive Counseling  Standardized Assessments completed: Not Needed  Assessment today and some CBT to explore thoughts about self. Patient experiencing depression. He is exploring weight management strategies as well. Brief CBT to discuss thoughts and emotions about self in context of health and weight. Patient also submitted short term  disability/FMLA paperwork to our office. Coordinated with PCP on this as she will complete it.  Patient and/or Family Response: Patient engaged in session.   Patient Centered Plan: Patient is on the following Treatment Plan(s):  CBT for depression  Assessment: Patient currently experiencing depression exacerbated by social isolation and health concerns.    Patient may benefit from CBT to process thoughts and emotions about self in context of health and weight concerns, as well as behavioral activation to increase physical activity and social support.  Plan: Follow up with behavioral health clinician on: 05/14/22 Referral(s): [facility] (In Clinic)  Estanislado Emms, LCSW

## 2022-04-22 ENCOUNTER — Ambulatory Visit: Payer: BC Managed Care – PPO | Admitting: Family

## 2022-04-23 NOTE — Telephone Encounter (Signed)
Appointment was made to complete forms. [place]

## 2022-04-24 ENCOUNTER — Encounter: Payer: Self-pay | Admitting: Nurse Practitioner

## 2022-04-24 ENCOUNTER — Ambulatory Visit (INDEPENDENT_AMBULATORY_CARE_PROVIDER_SITE_OTHER): Payer: BC Managed Care – PPO | Admitting: Nurse Practitioner

## 2022-04-24 VITALS — BP 145/92 | HR 86 | Temp 98.2°F | Ht 69.0 in | Wt >= 6400 oz

## 2022-04-24 DIAGNOSIS — F32A Depression, unspecified: Secondary | ICD-10-CM | POA: Diagnosis not present

## 2022-04-24 DIAGNOSIS — F419 Anxiety disorder, unspecified: Secondary | ICD-10-CM

## 2022-04-24 NOTE — Progress Notes (Unsigned)
@Patient  ID: Todd Griffith, male    DOB: 07/03/1990, 32 y.o.   MRN: [id number]  Chief Complaint  Patient presents with   Follow-up    FMLA forms. Feels like he has ear infection, both are bothering him right ear is worse    Referring provider: Fenton Foy, NP   HPI     No Known Allergies  Immunization History  Administered Date(s) Administered   Influenza,inj,Quad PF,6+ Mos 02/27/2021   PPD Test 11/08/2011   Pneumococcal Conjugate-13 11/09/2018   Pneumococcal Polysaccharide-23 02/23/2019   Tdap 10/07/2018    Past Medical History:  Diagnosis Date   Asthma    Bronchitis    CHF (congestive heart failure) ([place])    HIV (human immunodeficiency virus infection) ([place])    Obstructive sleep apnea     Tobacco History: Social History   Tobacco Use  Smoking Status Former   Types: Cigarettes   Quit date: 05/31/2018   Years since quitting: 3.9  Smokeless Tobacco Never   Counseling given: Not Answered   Outpatient Encounter Medications as of 04/24/2022  Medication Sig   albuterol (VENTOLIN HFA) 108 (90 Base) MCG/ACT inhaler Inhale 1-2 puffs into the lungs every 6 (six) hours as needed for wheezing or shortness of breath.   atorvastatin (LIPITOR) 20 MG tablet Take 1 tablet (20 mg total) by mouth daily. NEEDS FOLLOW UP APPOINTMENT FOR MORE REFILLS   bictegravir-emtricitabine-tenofovir AF (BIKTARVY) 50-200-25 MG TABS tablet Take 1 tablet by mouth daily.   bisoprolol (ZEBETA) 10 MG tablet TAKE 1 TABLET BY MOUTH EVERY DAY   [employer] ASPIRIN ADULT LOW DOSE 81 MG chewable tablet CHEW 1 TABLET BY MOUTH DAILY.   dapagliflozin propanediol (FARXIGA) 10 MG TABS tablet Take 1 tablet (10 mg total) by mouth daily before breakfast.   fluticasone (FLONASE) 50 MCG/ACT nasal spray Place 2 sprays into both nostrils daily.   furosemide (LASIX) 80 MG tablet Take 1 tablet (80 mg total) by mouth daily.   isosorbide-hydrALAZINE (BIDIL) 20-37.5 MG tablet Take 2 tablets by mouth 3 (three)  times daily.   sacubitril-valsartan (ENTRESTO) 97-103 MG Take 1 tablet by mouth 2 (two) times daily.   spironolactone (ALDACTONE) 25 MG tablet Take 1 tablet (25 mg total) by mouth daily.   No facility-administered encounter medications on file as of 04/24/2022.     Review of Systems  Review of Systems     Physical Exam  BP (!) 145/92   Pulse 86   Temp 98.2 F (36.8 C) (Temporal)   Ht 5\' 9"  (1.753 m)   Wt (!) 434 lb 9.6 oz (197.1 kg)   SpO2 98%   BMI 64.18 kg/m   Wt Readings from Last 5 Encounters:  04/24/22 (!) 434 lb 9.6 oz (197.1 kg)  04/12/22 (!) 430 lb (195 kg)  12/28/21 (!) 405 lb (183.7 kg)  10/29/21 (!) 431 lb (195.5 kg)  10/16/21 (!) 435 lb (197.3 kg)     Physical Exam   Lab Results:  CBC    Component Value Date/Time   WBC 7.0 02/16/2021 1654   RBC 3.87 (L) 02/16/2021 1654   HGB 12.6 (L) 02/16/2021 1654   HGB 13.7 09/22/2018 1129   HCT 37.8 (L) 02/16/2021 1654   PLT 209 02/16/2021 1654   MCV 97.7 02/16/2021 1654   MCH 32.6 02/16/2021 1654   MCHC 33.3 02/16/2021 1654   RDW 13.4 02/16/2021 1654   LYMPHSABS 1.6 02/16/2021 1654   MONOABS 0.8 02/16/2021 1654   EOSABS 0.4 02/16/2021 1654  BASOSABS 0.0 02/16/2021 1654    BMET    Component Value Date/Time   NA 139 12/28/2021 1006   K 3.6 12/28/2021 1006   CL 103 12/28/2021 1006   CO2 26 12/28/2021 1006   GLUCOSE 81 12/28/2021 1006   BUN 7 12/28/2021 1006   CREATININE 1.24 12/28/2021 1006   CREATININE 1.36 (H) 10/16/2021 1641   CALCIUM 9.2 12/28/2021 1006   GFRNONAA >60 12/28/2021 1006   GFRNONAA 58 (L) 10/12/2018 1647   GFRAA >60 11/18/2019 1139   GFRAA 67 10/12/2018 1647    BNP    Component Value Date/Time   BNP 1.9 12/28/2021 1006    ProBNP No results found for: "PROBNP"  Imaging: No results found.   Assessment & Plan:   No problem-specific Assessment & Plan notes found for this encounter.     Fenton Foy, NP 04/24/2022

## 2022-04-25 ENCOUNTER — Encounter: Payer: Self-pay | Admitting: Nurse Practitioner

## 2022-04-25 NOTE — Assessment & Plan Note (Signed)
Forms completed for FMLA today in office  Follow up:  Follow up in 3 months

## 2022-04-25 NOTE — Patient Instructions (Signed)
1. Anxiety and depression  Forms completed for FMLA today in office  Follow up:  Follow up in 3 months

## 2022-04-30 ENCOUNTER — Telehealth: Payer: Self-pay

## 2022-04-30 NOTE — Patient Outreach (Signed)
  Care Coordination   04/30/2022 Name: Deaunte Dente [facility] MRN: [id number] DOB: 16-Oct-1990   Care Coordination Outreach Attempts:  A third unsuccessful outreach was attempted today to offer the patient with information about available care coordination services as a benefit of their health plan.   Follow Up Plan:  No further outreach attempts will be made at this time. We have been unable to contact the patient to offer or enroll patient in care coordination services  Encounter Outcome:  No Answer   Care Coordination Interventions:  No, not indicated    Peter Garter RN, BSN,CCM, [facility] [phone]

## 2022-05-01 ENCOUNTER — Telehealth: Payer: Self-pay

## 2022-05-01 NOTE — Telephone Encounter (Signed)
[facility] request office notes for pt leave and they all were faxed over 05/01/22. [place]

## 2022-05-10 ENCOUNTER — Other Ambulatory Visit (HOSPITAL_COMMUNITY): Payer: Self-pay | Admitting: Family Medicine

## 2022-05-13 ENCOUNTER — Other Ambulatory Visit (HOSPITAL_COMMUNITY): Payer: Self-pay

## 2022-05-14 ENCOUNTER — Ambulatory Visit (INDEPENDENT_AMBULATORY_CARE_PROVIDER_SITE_OTHER): Payer: Self-pay | Admitting: Clinical

## 2022-05-14 DIAGNOSIS — F411 Generalized anxiety disorder: Secondary | ICD-10-CM

## 2022-05-14 DIAGNOSIS — F3289 Other specified depressive episodes: Secondary | ICD-10-CM

## 2022-05-14 NOTE — BH Specialist Note (Signed)
Integrated Behavioral Health Follow Up In-Person Visit  MRN: [id number] Name: Todd Griffith  Number of [place] Clinician visits: 2- Second Visit  Session Start time: 1100   Session End time: 1200  Total time in minutes: 60   Types of Service: Individual psychotherapy  Interpretor:No. Interpretor Name and Language: none  Subjective: Todd Griffith is a 32 y.o. male accompanied by  self. Patient was referred by Lazaro Arms, NP for depression. Patient reports the following symptoms/concerns: depression Duration of problem: several months; Severity of problem: moderate  Objective: Mood: Euthymic and Affect: Appropriate Risk of harm to self or others: No plan to harm self or others  Patient and/or Family's Strengths/Protective Factors: Social connections, Social and Emotional competence, Concrete supports in place (healthy food, safe environments, etc.), and Sense of purpose   Goals Addressed: Patient will:  Reduce symptoms of: depression Increase knowledge and/or ability of: coping skills, healthy habits, and self-management skills  Demonstrate ability to: Increase healthy adjustment to current life circumstances  Progress towards Goals: Ongoing  Interventions: Interventions utilized:  CBT Cognitive Behavioral Therapy and Supportive Counseling Standardized Assessments completed: GAD-7 and PHQ 9     05/14/2022   11:14 AM 04/12/2022    2:56 PM 10/29/2021    3:36 PM 10/16/2021    4:16 PM 06/26/2021    4:08 PM  Depression screen PHQ 2/9  Decreased Interest 2 3 0 0 0  Down, Depressed, Hopeless 3 3 0 0 0  PHQ - 2 Score 5 6 0 0 0  Altered sleeping 3 3 2    $ Tired, decreased energy 3 3 2    $ Change in appetite 2 0 0    Feeling bad or failure about yourself  3 3 1    $ Trouble concentrating 2 0 0    Moving slowly or fidgety/restless 0 0 0    Suicidal thoughts 2 1 0    PHQ-9 Score 20 16 5    $ Difficult doing work/chores Very difficult  Not difficult at all          05/14/2022   11:11 AM  GAD 7 : Generalized Anxiety Score  Nervous, Anxious, on Edge 3  Control/stop worrying 3  Worry too much - different things 3  Trouble relaxing 3  Restless 1  Easily annoyed or irritable 3  Afraid - awful might happen 3  Total GAD 7 Score 19  Anxiety Difficulty Very difficult   Patient is on leave from work due to his mental health condition. He was experiencing what he described as "break downs," which were likely panic attacks; patient reports shaking uncontrollably during these episodes. He experiences severe depression and anxiety, as shown in PHQ9 and GAD7 above. He experiences thoughts of suicide due to these problems, though denies a plan or intention to act on these thoughts.  CBT today to explore negative thoughts about self and the world and re-frame these thoughts. Due to grief over recent losses and history of trauma, patient experiencing unbalanced beliefs about self. Education provided on how thoughts, emotions, and behaviors are interconnected. Emotional validation and reflective listening provided. Processed some emotions related to life experiences.  Patient and/or Family Response: Patient engaged in session.   Patient Centered Plan: Patient is on the following Treatment Plan(s): CBT for depression and anxiety  Assessment: Patient currently experiencing severe depression and severe anxiety exacerbated by social isolation, health concerns, grief, and history of trauma.   Patient may benefit from CBT to process thoughts and emotions about  self in context of health and weight concerns, as well as behavioral activation to increase physical activity and social support.  Plan: Follow up with behavioral health clinician on: 05/27/22  Estanislado Emms, LCSW

## 2022-05-22 ENCOUNTER — Other Ambulatory Visit: Payer: Self-pay

## 2022-05-22 ENCOUNTER — Other Ambulatory Visit: Payer: Self-pay | Admitting: Family

## 2022-05-22 ENCOUNTER — Ambulatory Visit (INDEPENDENT_AMBULATORY_CARE_PROVIDER_SITE_OTHER): Payer: BC Managed Care – PPO | Admitting: Family

## 2022-05-22 ENCOUNTER — Encounter: Payer: Self-pay | Admitting: Family

## 2022-05-22 VITALS — BP 118/84 | HR 97 | Temp 97.6°F | Ht 70.0 in | Wt >= 6400 oz

## 2022-05-22 DIAGNOSIS — B20 Human immunodeficiency virus [HIV] disease: Secondary | ICD-10-CM | POA: Diagnosis not present

## 2022-05-22 DIAGNOSIS — Z0000 Encounter for general adult medical examination without abnormal findings: Secondary | ICD-10-CM

## 2022-05-22 DIAGNOSIS — Z23 Encounter for immunization: Secondary | ICD-10-CM

## 2022-05-22 MED ORDER — BIKTARVY 50-200-25 MG PO TABS: 1.0000 | ORAL_TABLET | Freq: Every day | ORAL | 6 refills | Status: DC

## 2022-05-22 NOTE — Assessment & Plan Note (Signed)
Todd Griffith continues to have well controlled virus with good adherence and tolerance to [employer]. Reviewed previous lab work and discussed plan of care. Check lab work today. Continue current dose of Biktarvy. Plan for follow up in 6 months or sooner if needed with lab work on the same day.

## 2022-05-22 NOTE — Assessment & Plan Note (Signed)
Discussed importance of safe sexual practice and condom use. Condoms and STD testing offered.  Influenza vaccination updated. Referral placed to [facility] for routine dental care.

## 2022-05-22 NOTE — Progress Notes (Signed)
Brief Narrative   Patient ID: Todd Griffith, male    DOB: 12/15/1990, 32 y.o.   MRN: [id number]  Mr. Youman if a 32 y/o AA male diagnosed with HIV disease in June of 2020 with risk factor being heterosexual contact. Initial viral load of 739 with CD4 count of 41,600.  Genotype never resulted. Entered care at [facility] Stage 1. No history of opportunistic infection. [id number] negative. Sole ART regimen of Biktarvy.    Subjective:    Chief Complaint  Patient presents with   Follow-up    HPI:  Todd Griffith is a 32 y.o. male with HIV disease last seen on 10/16/21 with well controlled virus and good adherence and tolerance to [place]. Viral load was undetectable with CD4 count 1231. Renal function, electrolytes and hepatic function were within normal ranges. Here today for routine follow up.   Wen has been doing well since his last office visit. Had father/daughter day with his daughter this morning. Follow up with Cardiology upcoming. Continues to take Biktarvy as prescribed with no adverse side effects. Did have an issue getting medications from the pharmacy secondary to a billing problem that has since been resolved. No new concerns/complaints. Condoms and STD testing offered. Healthcare maintenance includes influenza vaccination.   Denies fevers, chills, night sweats, headaches, changes in vision, neck pain/stiffness, nausea, diarrhea, vomiting, lesions or rashes.  No Known Allergies    Outpatient Medications Prior to Visit  Medication Sig Dispense Refill   albuterol (VENTOLIN HFA) 108 (90 Base) MCG/ACT inhaler Inhale 1-2 puffs into the lungs every 6 (six) hours as needed for wheezing or shortness of breath. 6.7 g 2   atorvastatin (LIPITOR) 20 MG tablet Take 1 tablet (20 mg total) by mouth daily. NEEDS FOLLOW UP APPOINTMENT FOR MORE REFILLS 90 tablet 1   bisoprolol (ZEBETA) 10 MG tablet TAKE 1 TABLET BY MOUTH EVERY DAY 30 tablet 0   [facility] ASPIRIN ADULT LOW DOSE 81 MG  chewable tablet CHEW 1 TABLET BY MOUTH DAILY. 30 tablet 11   dapagliflozin propanediol (FARXIGA) 10 MG TABS tablet Take 1 tablet (10 mg total) by mouth daily before breakfast. 90 tablet 1   fluticasone (FLONASE) 50 MCG/ACT nasal spray Place 2 sprays into both nostrils daily. 16 g 0   furosemide (LASIX) 80 MG tablet Take 1 tablet (80 mg total) by mouth daily. 90 tablet 2   isosorbide-hydrALAZINE (BIDIL) 20-37.5 MG tablet TAKE 2 TABLETS BY MOUTH 3 TIMES DAILY. 90 tablet 1   sacubitril-valsartan (ENTRESTO) 97-103 MG Take 1 tablet by mouth 2 (two) times daily. 180 tablet 2   spironolactone (ALDACTONE) 25 MG tablet Take 1 tablet (25 mg total) by mouth daily. 30 tablet 11   bictegravir-emtricitabine-tenofovir AF (BIKTARVY) 50-200-25 MG TABS tablet Take 1 tablet by mouth daily. 30 tablet 6   No facility-administered medications prior to visit.     Past Medical History:  Diagnosis Date   Asthma    Bronchitis    CHF (congestive heart failure) (HCC)    HIV (human immunodeficiency virus infection) ([place])    Obstructive sleep apnea      Past Surgical History:  Procedure Laterality Date   RIGHT/LEFT HEART CATH AND CORONARY ANGIOGRAPHY N/A 09/21/2018   Procedure: RIGHT/LEFT HEART CATH AND CORONARY ANGIOGRAPHY;  Surgeon: Leonie Man, MD;  Location: [facility];  Service: Cardiovascular;  Laterality: N/A;      Review of Systems  Constitutional:  Negative for appetite change, chills, fatigue, fever and unexpected weight change.  Eyes:  Negative for visual disturbance.  Respiratory:  Negative for cough, chest tightness, shortness of breath and wheezing.   Cardiovascular:  Negative for chest pain and leg swelling.  Gastrointestinal:  Negative for abdominal pain, constipation, diarrhea, nausea and vomiting.  Genitourinary:  Negative for dysuria, flank pain, frequency, genital sores, hematuria and urgency.  Skin:  Negative for rash.  Allergic/Immunologic: Negative for immunocompromised  state.  Neurological:  Negative for dizziness and headaches.      Objective:    BP 118/84   Pulse 97   Temp 97.6 F (36.4 C) (Oral)   Ht 5' 10"$  (1.778 m)   Wt (!) 422 lb (191.4 kg)   SpO2 97%   BMI 60.55 kg/m  Nursing note and vital signs reviewed.  Physical Exam Constitutional:      General: He is not in acute distress.    Appearance: He is well-developed. He is obese.  Eyes:     Conjunctiva/sclera: Conjunctivae normal.  Cardiovascular:     Rate and Rhythm: Normal rate and regular rhythm.     Heart sounds: Normal heart sounds. No murmur heard.    No friction rub. No gallop.  Pulmonary:     Effort: Pulmonary effort is normal. No respiratory distress.     Breath sounds: Normal breath sounds. No wheezing or rales.  Chest:     Chest wall: No tenderness.  Abdominal:     General: Bowel sounds are normal.     Palpations: Abdomen is soft.     Tenderness: There is no abdominal tenderness.  Musculoskeletal:     Cervical back: Neck supple.  Lymphadenopathy:     Cervical: No cervical adenopathy.  Skin:    General: Skin is warm and dry.     Findings: No rash.  Neurological:     Mental Status: He is alert and oriented to person, place, and time.  Psychiatric:        Behavior: Behavior normal.        Thought Content: Thought content normal.        Judgment: Judgment normal.         05/22/2022    2:00 PM 05/14/2022   11:14 AM 04/12/2022    2:56 PM 10/29/2021    3:36 PM 10/16/2021    4:16 PM  Depression screen PHQ 2/9  Decreased Interest 0 2 3 0 0  Down, Depressed, Hopeless 1 3 3 $ 0 0  PHQ - 2 Score 1 5 6 $ 0 0  Altered sleeping  3 3 2   $ Tired, decreased energy  3 3 2   $ Change in appetite  2 0 0   Feeling bad or failure about yourself   3 3 1   $ Trouble concentrating  2 0 0   Moving slowly or fidgety/restless  0 0 0   Suicidal thoughts  2 1 0   PHQ-9 Score  20 16 5   $ Difficult doing work/chores  Very difficult Not difficult at all         Assessment & Plan:     Patient Active Problem List   Diagnosis Date Noted   Anxiety and depression 04/15/2022   Therapeutic drug monitoring 10/17/2021   Healthcare maintenance 11/09/2018   HIV disease ([place]) 09/22/2018   Dilated cardiomyopathy ([place]) 09/21/2018   Acute combined systolic and diastolic heart failure (HCC)    Morbid obesity (HCC)    Volume overload 09/17/2018   Peripheral edema    Orthopnea    Cardiomegaly    Uncomplicated  asthma      Problem List Items Addressed This Visit       Other   HIV disease ([place]) - Primary    [place] continues to have well controlled virus with good adherence and tolerance to Biktarvy. Reviewed previous lab work and discussed plan of care. Check lab work today. Continue current dose of Biktarvy. Plan for follow up in 6 months or sooner if needed with lab work on the same day.       Relevant Medications   bictegravir-emtricitabine-tenofovir AF (BIKTARVY) 50-200-25 MG TABS tablet   Other Relevant Orders   COMPLETE METABOLIC PANEL WITH GFR   HIV-1 RNA quant-no reflex-bld   T-helper cell (CD4)- ([facility] clinic only)   AMB REFERRAL TO [place] maintenance    Discussed importance of safe sexual practice and condom use. Condoms and STD testing offered.  Influenza vaccination updated. Referral placed to [facility] for [facility] dental care.       Other Visit Diagnoses     Need for immunization against influenza       Relevant Orders   Flu Vaccine QUAD 79moIM (Fluarix, Fluzone & Alfiuria Quad PF) (Completed)        I am having Izaias M. Bookbinder maintain his fluticasone, CVS Aspirin Adult Low Dose, spironolactone, furosemide, Entresto, dapagliflozin propanediol, atorvastatin, bisoprolol, albuterol, isosorbide-hydrALAZINE, and Biktarvy.   Meds ordered this encounter  Medications   bictegravir-emtricitabine-tenofovir AF (BIKTARVY) 50-200-25 MG TABS tablet    Sig: Take 1 tablet by mouth daily.    Dispense:  30 tablet    Refill:  6     Order Specific Question:   Supervising Provider    Answer:   SCarlyle Basques[[id number]]     Follow-up: Return in about 6 months (around 11/20/2022), or if symptoms worsen or fail to improve.   GTerri Piedra MSN, FNP-C Nurse Practitioner [facility] CDunlonumber: [phone]

## 2022-05-22 NOTE — Patient Instructions (Signed)
Nice to see you. ? ?We will check your lab work today. ? ?Continue to take your medication daily as prescribed. ? ?Refills have been sent to the pharmacy. ? ?Plan for follow up in 6 months or sooner if needed with lab work on the same day. ? ?Have a great day and stay safe! ? ?

## 2022-05-22 NOTE — Telephone Encounter (Signed)
Patient has appointment 05/22/2022

## 2022-05-23 LAB — T-HELPER CELL (CD4) - (RCID CLINIC ONLY)
CD4 % Helper T Cell: 42 % (ref 33–65)
CD4 T Cell Abs: 1306 /uL (ref 400–1790)

## 2022-05-25 LAB — COMPLETE METABOLIC PANEL WITH GFR
AG Ratio: 1.4 (calc) (ref 1.0–2.5)
ALT: 27 U/L (ref 9–46)
AST: 22 U/L (ref 10–40)
Albumin: 4.1 g/dL (ref 3.6–5.1)
Alkaline phosphatase (APISO): 91 U/L (ref 36–130)
BUN: 13 mg/dL (ref 7–25)
CO2: 28 mmol/L (ref 20–32)
Calcium: 9.3 mg/dL (ref 8.6–10.3)
Chloride: 104 mmol/L (ref 98–110)
Creat: 1.1 mg/dL (ref 0.60–1.26)
Globulin: 3 g/dL (calc) (ref 1.9–3.7)
Glucose, Bld: 82 mg/dL (ref 65–99)
Potassium: 4.1 mmol/L (ref 3.5–5.3)
Sodium: 140 mmol/L (ref 135–146)
Total Bilirubin: 1 mg/dL (ref 0.2–1.2)
Total Protein: 7.1 g/dL (ref 6.1–8.1)
eGFR: 92 mL/min/{1.73_m2} (ref >=60)

## 2022-05-25 LAB — HIV-1 RNA QUANT-NO REFLEX-BLD
HIV 1 RNA Quant: NOT DETECTED Copies/mL
HIV-1 RNA Quant, Log: NOT DETECTED Log cps/mL

## 2022-05-27 ENCOUNTER — Ambulatory Visit (INDEPENDENT_AMBULATORY_CARE_PROVIDER_SITE_OTHER): Payer: Self-pay | Admitting: Clinical

## 2022-05-27 DIAGNOSIS — F411 Generalized anxiety disorder: Secondary | ICD-10-CM

## 2022-05-27 DIAGNOSIS — F3289 Other specified depressive episodes: Secondary | ICD-10-CM

## 2022-05-27 NOTE — BH Specialist Note (Unsigned)
Integrated Behavioral Health Follow Up In-Person Visit  MRN: [id number] Name: Todd Griffith  Number of [place] Clinician visits: 3- Third Visit  Session Start time: 1110   Session End time: 1200  Total time in minutes: 50   Types of Service: Individual psychotherapy  Interpretor:No. Interpretor Name and Language: none  Subjective: Todd Griffith is a 32 y.o. male accompanied by  self. Patient was referred by Lazaro Arms, NP for depression. Patient reports the following symptoms/concerns: depression Duration of problem: several months; Severity of problem: moderate  Objective: Mood: Euthymic and Affect: Appropriate Risk of harm to self or others: No plan to harm self or others  Patient and/or Family's Strengths/Protective Factors: Social connections, Social and Emotional competence, Concrete supports in place (healthy food, safe environments, etc.), and Sense of purpose   Goals Addressed: Patient will:  Reduce symptoms of: depression Increase knowledge and/or ability of: coping skills, healthy habits, and self-management skills  Demonstrate ability to: Increase healthy adjustment to current life circumstances  Progress towards Goals: Ongoing  Interventions: Interventions utilized:  CBT Cognitive Behavioral Therapy Standardized Assessments completed: Not Needed  Patient continues to experience depression and anxiety related to grief over loss of loved ones recently, and likely also related to trauma history. CBT today to process thoughts and emotions related to relationship with his mom. Mom has struggled with substance abuse since patient was a child. Discussed how patient might address the challenges in their relationship with mom. Patient's also has some anxiety currently focused on his grandmother, who mostly raised him; he is concerned about her health. Processed thoughts and emotions about this as well. Patient reports a  decrease in severe anxiety attacks since being on leave from work.   Patient and/or Family Response: Patient engaged in session.   Assessment: Patient currently experiencing severe depression and severe anxiety exacerbated by social isolation, health concerns, grief, and history of trauma.   Patient may benefit from CBT to process thoughts and emotions about self in context of health and weight concerns, as well as behavioral activation to increase physical activity and social support.  Plan: Follow up with behavioral health clinician on: 06/10/22  Estanislado Emms, LCSW

## 2022-05-30 ENCOUNTER — Telehealth: Payer: Self-pay | Admitting: Clinical

## 2022-05-30 NOTE — Telephone Encounter (Signed)
Integrated Behavioral Health Progress Note  05/30/2022 Name: Todd Griffith [facility] MRN: [id number] DOB: 1990/04/26 Todd Griffith is a 32 y.o. year old male who sees Fenton Foy, NP for primary care. LCSW was initially consulted to assist the patient with Mental Health Counseling and Resources.   Interpreter: No.   Interpreter Name & Language: none  Assessment: Patient experiencing mental health concerns.  Ongoing Intervention: Dr. Valora Corporal, assessor with [facility], called CSW requesting to complete peer-to-peer review for patient's FMLA request. CSW requested formal request for peer-to-peer be sent to our office from [place], as we have not yet received this.  Review of patient status, including review of consultants reports, relevant laboratory and other test results, and collaboration with appropriate care team members and the patient's provider was performed as part of comprehensive patient evaluation and provision of services.    Estanislado Emms, [facility] [phone]

## 2022-05-31 ENCOUNTER — Telehealth: Payer: Self-pay | Admitting: Clinical

## 2022-05-31 NOTE — Telephone Encounter (Signed)
Integrated Behavioral Health Progress Note  05/31/2022 Name: Todd Griffith [facility] MRN: [id number] DOB: January 14, 1991 Todd Griffith is a 32 y.o. year old male who sees Fenton Foy, NP for primary care. LCSW was initially consulted to assist the patient with Mental Health Counseling and Resources.   Interpreter: No.   Interpreter Name & Language: none  Assessment: Patient experiencing mental health concerns.  Ongoing Intervention: Received formal request for peer-to-peer review from [place] regarding patient's FMLA request. Received another call from assessor, Dr. Valora Corporal and completed peer-to-peer review.  Review of patient status, including review of consultants reports, relevant laboratory and other test results, and collaboration with appropriate care team members and the patient's provider was performed as part of comprehensive patient evaluation and provision of services.    Estanislado Emms, [facility] [phone]

## 2022-06-06 ENCOUNTER — Ambulatory Visit (HOSPITAL_COMMUNITY): Payer: BC Managed Care – PPO | Admitting: Psychiatry

## 2022-06-09 IMAGING — DX DG CHEST 1V PORT
1 series · 1 of 1 positions shown · non-contrast
Comparison: Chest x-ray 09/16/2019.

CLINICAL DATA: HIV, fever and cough.

EXAM:
PORTABLE CHEST 1 VIEW

[chest ap]
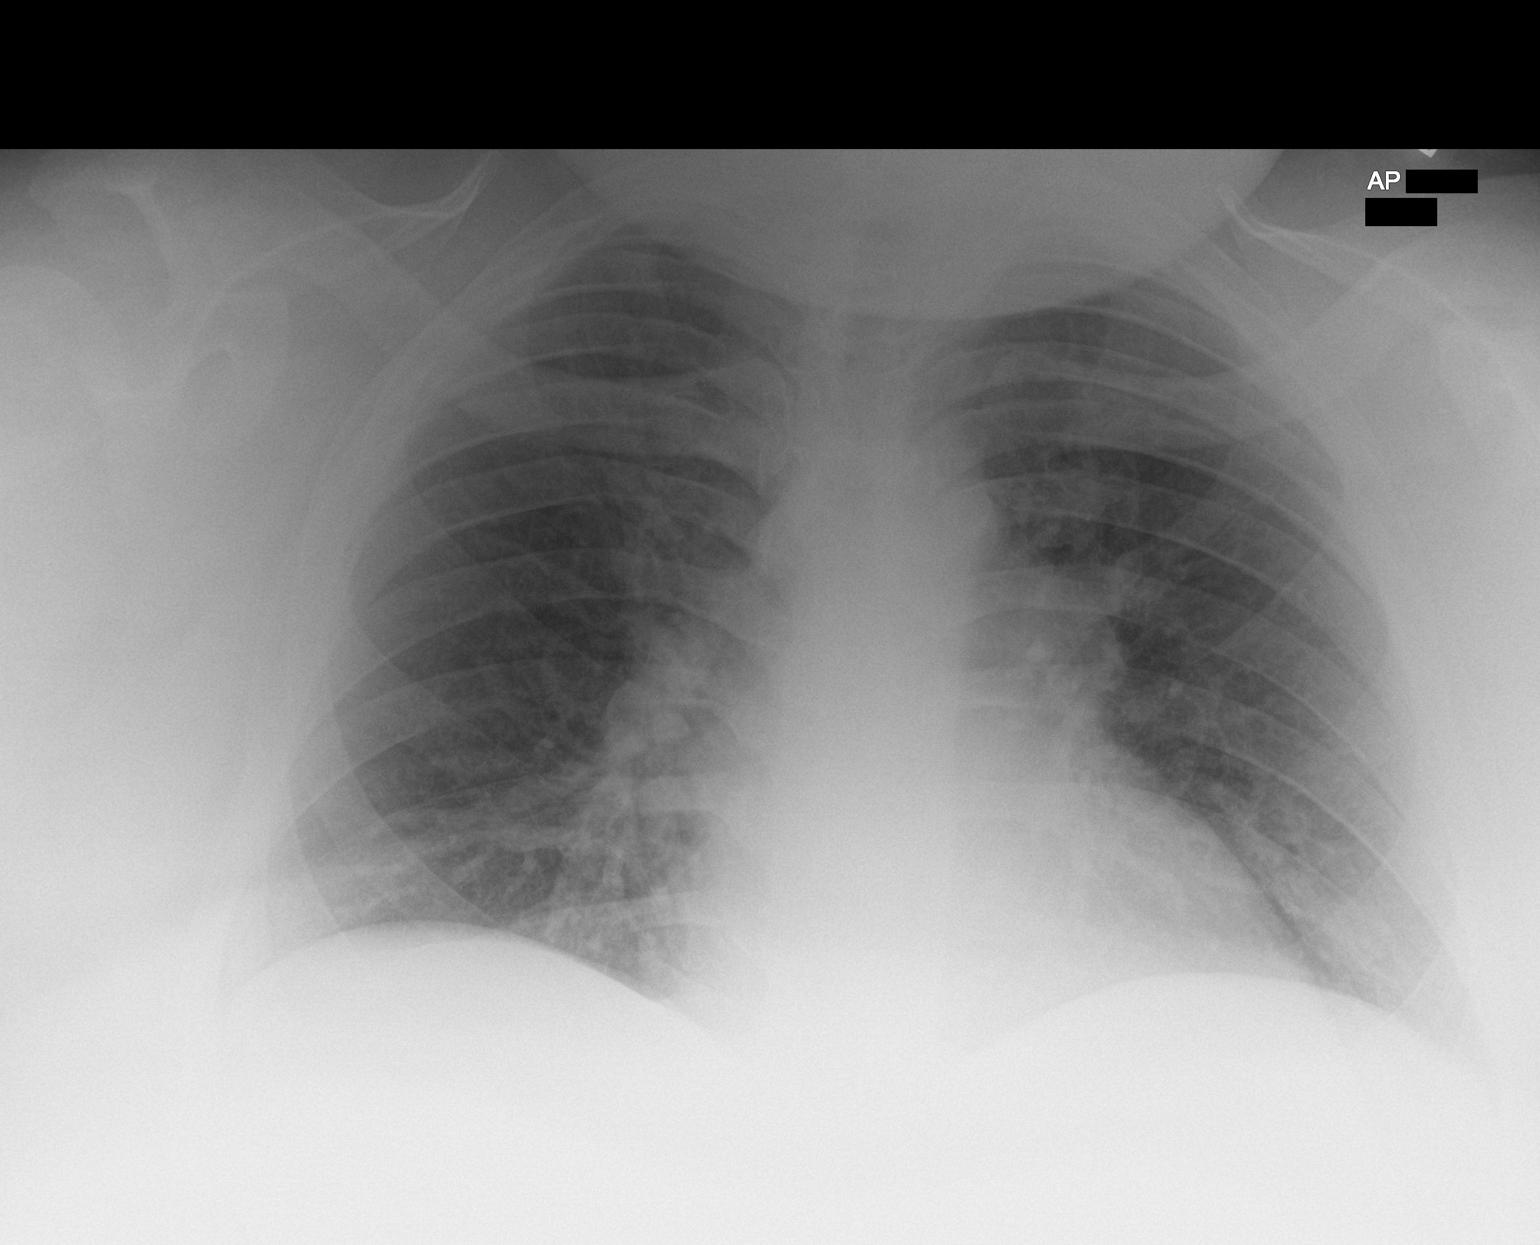

[1 of 1 positions shown; findings below may reference images not displayed]

FINDINGS: There are minimal patchy opacities in the left lung base. There is
minimal atelectasis in the right lung base. There is no pleural
effusion or pneumothorax. Cardiomediastinal silhouette is within
normal limits. No acute fractures are seen.
IMPRESSION: 1. Minimal bibasilar atelectasis/airspace disease minimal patchy
opacities in the left lung base worrisome for infection.

## 2022-06-10 ENCOUNTER — Ambulatory Visit: Payer: Self-pay | Admitting: Clinical

## 2022-06-17 ENCOUNTER — Other Ambulatory Visit (HOSPITAL_COMMUNITY): Payer: Self-pay

## 2022-06-17 ENCOUNTER — Other Ambulatory Visit (HOSPITAL_COMMUNITY): Payer: Self-pay | Admitting: Family Medicine

## 2022-06-26 ENCOUNTER — Ambulatory Visit: Payer: BC Managed Care – PPO | Admitting: Clinical

## 2022-06-26 DIAGNOSIS — F3289 Other specified depressive episodes: Secondary | ICD-10-CM | POA: Diagnosis not present

## 2022-06-26 DIAGNOSIS — F411 Generalized anxiety disorder: Secondary | ICD-10-CM
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
## 2022-06-28 ENCOUNTER — Encounter (HOSPITAL_COMMUNITY): Payer: Self-pay

## 2022-06-28 ENCOUNTER — Ambulatory Visit (HOSPITAL_COMMUNITY)
Admission: EM | Admit: 2022-06-28 | Discharge: 2022-06-28 | Disposition: A | Discharge status: Home / Self Care | Payer: BC Managed Care – PPO | Attending: Nurse Practitioner | Admitting: Nurse Practitioner

## 2022-06-28 DIAGNOSIS — Z202 Contact with and (suspected) exposure to infections with a predominantly sexual mode of transmission: Secondary | ICD-10-CM | POA: Insufficient documentation

## 2022-06-28 DIAGNOSIS — N50812 Left testicular pain: Secondary | ICD-10-CM | POA: Diagnosis not present

## 2022-06-28 DIAGNOSIS — N50811 Right testicular pain: Secondary | ICD-10-CM | POA: Insufficient documentation

## 2022-06-28 MED ORDER — METRONIDAZOLE 500 MG PO TABS: 500.0000 mg | ORAL_TABLET | Freq: Two times a day (BID) | ORAL | 0 refills | Status: AC

## 2022-06-28 NOTE — Discharge Instructions (Addendum)
Please take the metronidazole as prescribed to treat trichomonas since you think you may have been exposed.   We will call you if the cytology swabs shows anything else that requires treatment  If symptoms do not improve with the medicine and testing comes back normal, follow up with a Urologist - contact information provided  Please use condoms with every sexual encounter

## 2022-06-28 NOTE — ED Provider Notes (Signed)
[name]    CSN: [id number] Arrival date & time: 06/28/22  1403      History   Chief Complaint Chief Complaint  Patient presents with   Groin Pain   Exposure to STD    HPI Todd Griffith is a 32 y.o. male.   Patient presents today for exposure to trichomonas.  Reports he is having bilateral testicle pain, worse on the right, and occasional dysuria.  No urinary frequency, hematuria, penile discharge, lymphadenopathy, abdominal pain, fever, bodyaches, chills, nausea/vomiting.  No penile sores, rashes, or lesions.  Declines syphilis testing today.  Reports medical history significant for HIV and is compliant with his medication.    Past Medical History:  Diagnosis Date   Asthma    Bronchitis    CHF (congestive heart failure) (HCC)    HIV (human immunodeficiency virus infection) ([place])    Obstructive sleep apnea     Patient Active Problem List   Diagnosis Date Noted   Anxiety and depression 04/15/2022   Therapeutic drug monitoring 10/17/2021   Healthcare maintenance 11/09/2018   HIV disease ([place]) 09/22/2018   Dilated cardiomyopathy ([place]) 09/21/2018   Acute combined systolic and diastolic heart failure (HCC)    Morbid obesity (HCC)    Volume overload 09/17/2018   Peripheral edema    Orthopnea    Cardiomegaly    Uncomplicated asthma     Past Surgical History:  Procedure Laterality Date   RIGHT/LEFT HEART CATH AND CORONARY ANGIOGRAPHY N/A 09/21/2018   Procedure: RIGHT/LEFT HEART CATH AND CORONARY ANGIOGRAPHY;  Surgeon: Leonie Man, MD;  Location: [facility];  Service: Cardiovascular;  Laterality: N/A;       Home Medications    Prior to Admission medications   Medication Sig Start Date End Date Taking? Authorizing Provider  atorvastatin (LIPITOR) 20 MG tablet Take 1 tablet (20 mg total) by mouth daily. NEEDS FOLLOW UP APPOINTMENT FOR MORE REFILLS 01/15/22  Yes Larey Dresser, MD  bictegravir-emtricitabine-tenofovir AF  (BIKTARVY) 50-200-25 MG TABS tablet Take 1 tablet by mouth daily. 05/22/22  Yes Golden Circle, FNP  bisoprolol (ZEBETA) 10 MG tablet TAKE 1 TABLET BY MOUTH EVERY DAY 03/21/22  Yes Fenton Foy, NP  [facility] ASPIRIN ADULT LOW DOSE 81 MG chewable tablet CHEW 1 TABLET BY MOUTH DAILY. 06/08/20  Yes Simmons, Brittainy M, PA-C  FARXIGA 10 MG TABS tablet TAKE 1 TABLET BY MOUTH DAILY BEFORE BREAKFAST. 06/17/22  Yes Milford, Maricela Bo, FNP  fluticasone (FLONASE) 50 MCG/ACT nasal spray Place 2 sprays into both nostrils daily. 09/16/19  Yes Amyot, Nicholes Stairs, NP  furosemide (LASIX) 80 MG tablet Take 1 tablet (80 mg total) by mouth daily. 12/28/21  Yes Milford, Shaquile Lutze M, FNP  isosorbide-hydrALAZINE (BIDIL) 20-37.5 MG tablet TAKE 2 TABLETS BY MOUTH 3 TIMES DAILY. 05/13/22  Yes Milford, Maricela Bo, FNP  metroNIDAZOLE (FLAGYL) 500 MG tablet Take 1 tablet (500 mg total) by mouth 2 (two) times daily for 7 days. 06/28/22 07/05/22 Yes Eulogio Bear, NP  albuterol (VENTOLIN HFA) 108 (90 Base) MCG/ACT inhaler Inhale 1-2 puffs into the lungs every 6 (six) hours as needed for wheezing or shortness of breath. 04/12/22   Fenton Foy, NP  sacubitril-valsartan (ENTRESTO) 97-103 MG Take 1 tablet by mouth 2 (two) times daily. 12/28/21   Rafael Bihari, FNP  spironolactone (ALDACTONE) 25 MG tablet Take 1 tablet (25 mg total) by mouth daily. 12/28/21   Rafael Bihari, FNP    Family History Family History  Problem Relation Age of Onset   Hypertension Mother    Diabetes Mellitus II Father    CAD Father        Died at 55 of 'CAD' while sleeping    Obesity Sister    Colon cancer Neg Hx    Esophageal cancer Neg Hx     Social History Social History   Tobacco Use   Smoking status: Former    Types: Cigarettes    Quit date: 05/31/2018    Years since quitting: 4.0   Smokeless tobacco: Never  Vaping Use   Vaping Use: Former  Substance Use Topics   Alcohol use: Yes    Alcohol/week: 3.0 standard drinks of alcohol     Types: 3 Cans of beer per week    Comment: socially    Drug use: Not Currently     Allergies   Patient has no known allergies.   Review of Systems Review of Systems Per HPI  Physical Exam Triage Vital Signs ED Triage Vitals  Enc Vitals Group     BP 06/28/22 1518 (!) 144/99     Pulse Rate 06/28/22 1518 98     Resp 06/28/22 1524 18     Temp 06/28/22 1518 97.9 F (36.6 C)     Temp Source 06/28/22 1518 Oral     SpO2 06/28/22 1518 98 %     Weight --      Height --      Head Circumference --      Peak Flow --      Pain Score --      Pain Loc --      Pain Edu? --      Excl. in [place]? --    No data found.  Updated Vital Signs BP (!) 144/99 (BP Location: Left Arm)   Pulse 98   Temp 97.9 F (36.6 C) (Oral)   Resp 18   SpO2 98%   Visual Acuity Right Eye Distance:   Left Eye Distance:   Bilateral Distance:    Right Eye Near:   Left Eye Near:    Bilateral Near:     Physical Exam Vitals and nursing note reviewed.  Constitutional:      General: He is not in acute distress.    Appearance: Normal appearance. He is not toxic-appearing.  Pulmonary:     Effort: Pulmonary effort is normal. No respiratory distress.  Genitourinary:    Comments: Deferred-self swab performed Skin:    General: Skin is warm and dry.     Capillary Refill: Capillary refill takes less than 2 seconds.     Coloration: Skin is not jaundiced or pale.  Neurological:     Mental Status: He is alert and oriented to person, place, and time.     Motor: No weakness.     Gait: Gait normal.  Psychiatric:        Behavior: Behavior is cooperative.      UC Treatments / Results  Labs (all labs ordered are listed, but only abnormal results are displayed) Labs Reviewed  CYTOLOGY, (ORAL, ANAL, URETHRAL) ANCILLARY ONLY    EKG   Radiology No results found.  Procedures Procedures (including critical care time)  Medications Ordered in UC Medications - No data to display  Initial Impression /  Assessment and Plan / UC Course  I have reviewed the triage vital signs and the nursing notes.  Pertinent labs & imaging results that were available during my care of the patient were reviewed  by me and considered in my medical decision making (see chart for details).   Patient is well-appearing, normotensive, afebrile, not tachycardic, not tachypneic, oxygenating well on room air.    1. Pain in both testicles 2. Exposure to trichomonas Cytology is pending for trichomonas, gonorrhea, chlamydia Given history of exposure to trichomonas, will treat with metronidazole 500 mg twice daily for 7 days Encouraged abstinence for 7 days after treatment completion Condoms given today-encouraged use with every sexual encounter Follow-up with urologist if testicular pain symptoms persist despite treatment  The patient was given the opportunity to ask questions.  All questions answered to their satisfaction.  The patient is in agreement to this plan.   Final Clinical Impressions(s) / UC Diagnoses   Final diagnoses:  Pain in both testicles  Exposure to trichomonas     Discharge Instructions      Please take the metronidazole as prescribed to treat trichomonas since you think you may have been exposed.   We will call you if the cytology swabs shows anything else that requires treatment  If symptoms do not improve with the medicine and testing comes back normal, follow up with a Urologist - contact information provided  Please use condoms with every sexual encounter     ED Prescriptions     Medication Sig Dispense Auth. Provider   metroNIDAZOLE (FLAGYL) 500 MG tablet Take 1 tablet (500 mg total) by mouth 2 (two) times daily for 7 days. 14 tablet Eulogio Bear, NP      PDMP not reviewed this encounter.   Eulogio Bear, NP 06/28/22 [phone]

## 2022-06-28 NOTE — ED Triage Notes (Signed)
Pt is here for groin pain x 1 week. Pt would like STD-testing today. Pt states he was expose to Trich.

## 2022-07-01 LAB — CYTOLOGY, (ORAL, ANAL, URETHRAL) ANCILLARY ONLY
Chlamydia: NEGATIVE
Comment: NEGATIVE
Comment: NEGATIVE
Comment: NORMAL
Neisseria Gonorrhea: NEGATIVE
Trichomonas: POSITIVE — AB

## 2022-07-19 ENCOUNTER — Telehealth: Payer: Self-pay | Admitting: Nurse Practitioner

## 2022-07-19 NOTE — Telephone Encounter (Signed)
Sent Kh 

## 2022-07-19 NOTE — Telephone Encounter (Signed)
Todd Griffith claims forms for FMLA dated 07/11/22 completed and given to [employer], RMA . Please fax and notify patient.

## 2022-07-25 ENCOUNTER — Encounter (INDEPENDENT_AMBULATORY_CARE_PROVIDER_SITE_OTHER): Payer: BC Managed Care – PPO | Admitting: Family Medicine

## 2022-07-29 ENCOUNTER — Encounter (HOSPITAL_COMMUNITY): Payer: Self-pay | Admitting: Cardiology

## 2022-07-29 ENCOUNTER — Ambulatory Visit (HOSPITAL_BASED_OUTPATIENT_CLINIC_OR_DEPARTMENT_OTHER)
Admit: 2022-07-29 | Discharge: 2022-07-29 | Disposition: A | Discharge status: Home / Self Care | Payer: BC Managed Care – PPO | Attending: Cardiology | Admitting: Cardiology

## 2022-07-29 ENCOUNTER — Ambulatory Visit (HOSPITAL_COMMUNITY)
Admit: 2022-07-29 | Discharge: 2022-07-29 | Disposition: A | Discharge status: Home / Self Care | Payer: BC Managed Care – PPO | Source: Ambulatory Visit | Attending: Nurse Practitioner | Admitting: Nurse Practitioner

## 2022-07-29 VITALS — BP 140/90 | HR 87 | Wt >= 6400 oz

## 2022-07-29 DIAGNOSIS — Z6841 Body Mass Index (BMI) 40.0 and over, adult: Secondary | ICD-10-CM | POA: Insufficient documentation

## 2022-07-29 DIAGNOSIS — I5022 Chronic systolic (congestive) heart failure: Secondary | ICD-10-CM | POA: Insufficient documentation

## 2022-07-29 DIAGNOSIS — I428 Other cardiomyopathies: Secondary | ICD-10-CM | POA: Diagnosis not present

## 2022-07-29 DIAGNOSIS — Z21 Asymptomatic human immunodeficiency virus [HIV] infection status: Secondary | ICD-10-CM | POA: Insufficient documentation

## 2022-07-29 DIAGNOSIS — Z79899 Other long term (current) drug therapy: Secondary | ICD-10-CM | POA: Diagnosis not present

## 2022-07-29 DIAGNOSIS — I340 Nonrheumatic mitral (valve) insufficiency: Secondary | ICD-10-CM | POA: Diagnosis not present

## 2022-07-29 DIAGNOSIS — I2510 Atherosclerotic heart disease of native coronary artery without angina pectoris: Secondary | ICD-10-CM | POA: Diagnosis not present

## 2022-07-29 DIAGNOSIS — F1010 Alcohol abuse, uncomplicated: Secondary | ICD-10-CM | POA: Diagnosis not present

## 2022-07-29 DIAGNOSIS — Z7982 Long term (current) use of aspirin: Secondary | ICD-10-CM | POA: Diagnosis not present

## 2022-07-29 DIAGNOSIS — G4733 Obstructive sleep apnea (adult) (pediatric): Secondary | ICD-10-CM | POA: Insufficient documentation

## 2022-07-29 DIAGNOSIS — I110 Hypertensive heart disease with heart failure: Secondary | ICD-10-CM | POA: Diagnosis not present

## 2022-07-29 LAB — BRAIN NATRIURETIC PEPTIDE: B Natriuretic Peptide: 11.1 pg/mL (ref 0.0–100.0)

## 2022-07-29 LAB — ECHOCARDIOGRAM COMPLETE
Area-P 1/2: 4.39 cm2
Calc EF: 44.6 %
S' Lateral: 4.4 cm
Single Plane A2C EF: 45.5 %
Single Plane A4C EF: 45.6 %

## 2022-07-29 LAB — LIPID PANEL
Cholesterol: 101 mg/dL (ref 0–200)
HDL: 27 mg/dL — ABNORMAL LOW (ref >40)
LDL Cholesterol: 38 mg/dL (ref 0–99)
Total CHOL/HDL Ratio: 3.7 RATIO
Triglycerides: 181 mg/dL — ABNORMAL HIGH (ref <150)
VLDL: 36 mg/dL (ref 0–40)

## 2022-07-29 LAB — BASIC METABOLIC PANEL
Anion gap: 8 (ref 5–15)
BUN: 14 mg/dL (ref 6–20)
CO2: 26 mmol/L (ref 22–32)
Calcium: 8.9 mg/dL (ref 8.9–10.3)
Chloride: 105 mmol/L (ref 98–111)
Creatinine, Ser: 1.22 mg/dL (ref 0.61–1.24)
GFR, Estimated: >60 mL/min (ref >60)
Glucose, Bld: 90 mg/dL (ref 70–99)
Potassium: 4.1 mmol/L (ref 3.5–5.1)
Sodium: 139 mmol/L (ref 135–145)

## 2022-07-29 MED ORDER — ISOSORB DINITRATE-HYDRALAZINE 20-37.5 MG PO TABS: 1.0000 | ORAL_TABLET | Freq: Three times a day (TID) | ORAL | 3 refills | Status: DC

## 2022-07-29 MED ORDER — BISOPROLOL FUMARATE 10 MG PO TABS: 10.0000 mg | ORAL_TABLET | Freq: Every day | ORAL | 3 refills | Status: DC

## 2022-07-29 MED ORDER — SPIRONOLACTONE 25 MG PO TABS: 25.0000 mg | ORAL_TABLET | Freq: Every day | ORAL | 3 refills | Status: DC

## 2022-07-29 NOTE — Patient Instructions (Signed)
RESTART Bidil 1 Tab Three times a day   Spironolactone 25 mg daily  Bisoprolol 10 mg daily   Labs done today, your results will be available in MyChart, we will contact you for abnormal readings.  Repeat blood work in 10 days  You have been [facility]. They will call you to arrange your appointment.  Your physician recommends that you schedule a follow-up appointment in: 6 weeks  If you have any questions or concerns before your next appointment please send [place] a message through [place] or call our office at [phone].    TO LEAVE A MESSAGE FOR THE NURSE SELECT OPTION 2, PLEASE LEAVE A MESSAGE INCLUDING: YOUR NAME DATE OF BIRTH CALL BACK NUMBER REASON FOR CALL**this is important as we prioritize the call backs  YOU WILL RECEIVE A CALL BACK THE SAME DAY AS LONG AS YOU CALL BEFORE 4:00 PM  At the [facility], you and your health needs are our priority. As part of our continuing mission to provide you with exceptional heart care, we have created designated Provider Care Teams. These Care Teams include your primary Cardiologist (physician) and Advanced Practice Providers (APPs- Physician Assistants and Nurse Practitioners) who all work together to provide you with the care you need, when you need it.   You may see any of the following providers on your designated Care Team at your next follow up: Dr Arvilla Meres Dr Marca Ancona Dr. Marcos Eke, NP Robbie Lis, [place] [facility] [place], [place] Brynda Peon, NP Karle Plumber, PharmD   Please be sure to bring in all your medications bottles to every appointment.    Thank you for choosing [facility]

## 2022-07-29 NOTE — Progress Notes (Signed)
Echocardiogram 2D Echocardiogram has been performed.  Todd Griffith 07/29/2022, 1:50 PM

## 2022-07-30 NOTE — Progress Notes (Signed)
PCP: Ivonne Andrew, NP Cardiology: Dr. Shirlee Latch  32 y.o. with history of asthma, OSA, HIV+, and nonischemic cardiomyopathy returns for followup of CHF.  He was admitted in 6/20 with dyspnea and found to have CHF.  Echo showed EF 15-20% with diffuse hypokinesis, moderate-severe MR. LHC/RHC showed mild coronary disease, preserved cardiac output.  He was also diagnosed with HIV and started on Biktarvy.   Echo in 10/20 showed EF up to 35-40%, MR is only trivial.  Echo in 2/21 showed EF 35-40% with normal RV.  Echo 7/23 showed EF up to 45% with normal RV.   Echo was done today showing EF 40-45%, mild LV dilation, global hypokinesis, normal RV.   Today he returns for HF follow up. He has been off bisoprolol, Bidil, and spironolactone for "months."  Says the pharmacy quit refilling them and he assumed that we wanted him to stop them.  BP elevated today.  He is using his CPAP regularly.  His weight is up about 22 lbs.  He has no dyspnea with his normal activities.  He walks for exercise. No chest pain.  No lightheadedness.  No orthopnea/PND.    ECG (personally reviewed): NSR, normal  Labs (9/20): K 4.2, creatinine 1.48 Labs (10/20): LDL 73, HDL 26 Labs (11/20): K 4.1, creatinine 1.39 Labs (12/20): K 4.2, creatinine 1.43 Labs (1/22): K 3.8, creatinine 1.32, LDL 58, HDL 25 Labs (4/22): K 3.8, creatinine 1.4 Labs (7/23): K 4.2, creatinine 1.36, LFTs normal  Labs (9/23): LDL 80 Labs (2/24): K 4.1, creatinine 1.10  PMH: 1. Chronic systolic CHF: Nonischemic cardiomyopathy.   - LHC/RHC (6/20): mean RA 4, mean PCWP 21, CI 3; 70% D1 stenosis.  - Echo (6/20): EF 15-20%, normal RV, moderate-severe MR.  - Echo (10/20): EF 35-40% with diffuse hypokinesis, normal RV, trivial MR.   - Echo (2/21): EF 35-40%, diffuse hypokinesis, normal RV, normal IVC - Echo (7/22): EF 45%, normal RV.  - Echo (4/24): EF 40-45%, mild LV dilation, global hypokinesis, normal RV. 2. HIV+: Followed by ID.  3. H/o ETOH abuse.  4.  Asthma: Since childhood.  5. HTN 6. Morbid obesity.  7. OSA: Uses CPAP.  8. CAD: LHC in 6/20 with 70% D1 stenosis.   FH: Father, grandf ather with CAD, no cardiomyopathy.   SH: Lives in [place] with girlfriend and child, works for [employer] of [place].  Nonsmoker.  Prior heavy ETOH, now drinks some on weekend but much less. Nonsmoker, no drugs.   ROS: All systems reviewed and negative except as per HPI.   Current Outpatient Medications  Medication Sig Dispense Refill   albuterol (VENTOLIN HFA) 108 (90 Base) MCG/ACT inhaler Inhale 1-2 puffs into the lungs every 6 (six) hours as needed for wheezing or shortness of breath. 6.7 g 2   atorvastatin (LIPITOR) 20 MG tablet Take 1 tablet (20 mg total) by mouth daily. NEEDS FOLLOW UP APPOINTMENT FOR MORE REFILLS 90 tablet 1   bictegravir-emtricitabine-tenofovir AF (BIKTARVY) 50-200-25 MG TABS tablet Take 1 tablet by mouth daily. 30 tablet 6   [facility] ASPIRIN ADULT LOW DOSE 81 MG chewable tablet CHEW 1 TABLET BY MOUTH DAILY. 30 tablet 11   FARXIGA 10 MG TABS tablet TAKE 1 TABLET BY MOUTH DAILY BEFORE BREAKFAST. 90 tablet 1   fluticasone (FLONASE) 50 MCG/ACT nasal spray Place 2 sprays into both nostrils daily. 16 g 0   furosemide (LASIX) 80 MG tablet Take 1 tablet (80 mg total) by mouth daily. 90 tablet 2   sacubitril-valsartan (ENTRESTO)  97-103 MG Take 1 tablet by mouth 2 (two) times daily. 180 tablet 2   bisoprolol (ZEBETA) 10 MG tablet Take 1 tablet (10 mg total) by mouth daily. 90 tablet 3   isosorbide-hydrALAZINE (BIDIL) 20-37.5 MG tablet Take 1 tablet by mouth 3 (three) times daily. 90 tablet 3   spironolactone (ALDACTONE) 25 MG tablet Take 1 tablet (25 mg total) by mouth daily. 90 tablet 3   No current facility-administered medications for this encounter.   Wt Readings from Last 3 Encounters:  07/29/22 (!) 194 kg (427 lb 9.6 oz)  05/22/22 (!) 191.4 kg (422 lb)  04/24/22 (!) 197.1 kg (434 lb 9.6 oz)   BP (!) 140/90   Pulse 87   Wt (!) 194 kg  (427 lb 9.6 oz)   SpO2 96%   BMI 61.35 kg/m  General: NAD, obese.  Neck: Thick. No JVD, no thyromegaly or thyroid nodule.  Lungs: Clear to auscultation bilaterally with normal respiratory effort. CV: Nondisplaced PMI.  Heart regular S1/S2, no S3/S4, no murmur.  No peripheral edema.  No carotid bruit.  Normal pedal pulses.  Abdomen: Soft, nontender, no hepatosplenomegaly, no distention.  Skin: Intact without lesions or rashes.  Neurologic: Alert and oriented x 3.  Psych: Normal affect. Extremities: No clubbing or cyanosis.  HEENT: Normal.   Assessment/Plan: 1. Chronic systolic CHF: Nonischemic cardiomyopathy.  Echo in 6/20 showed EF 15-20% with moderate-severe MR.  RHC/LHC indicated nonischemic cardiomyopathy, cardiac output preserved. Cause of cardiomyopathy is uncertain, he was a heavy drinker in the past.  He has also been found to have HIV, which can cause a cardiomyopathy, but he has been on meds for this and it is controlled.  Echo in 10/20 showed EF improved up to 35-40%.  Echo in 2/21 showed EF 35-40% still.  Echo 7/22 showed EF up to 45%.  Echo today showed EF 40-45%.  He has been off bisoprolol, Bidil, and spironolactone x months.  He is not volume overloaded on exam, stable NYHA class I-II.  - Restart bisoprolol 10 mg daily.  - Continue Entresto 97/103 mg bid.  - Restart spironolactone 25 mg daily. BMET/BNP today, BMET 10 days.  - Restart Bidil at 1 tab tid.  - Continue dapagliflozin 10 mg daily. No GU symptoms. - Continue Lasix 80 daily.     - EF is outside of ICD range.      - I do not think that he would fit in scanner for cardiac MRI.  2. CAD: LHC (6/20) showed 70% D1 stenosis.   - Continue ASA 81 daily.  - Continue atorvastatin 20 mg daily. Good lipids in 1/22. Check lipids today. 3. Mitral regurgitation: Much improved on most recent echoes.  4. ETOH abuse: Prior ETOH abuse may have contributed to his cardiomyopathy. He has cut back considerably.  5. OSA: Continue to use  CPAP.  6. Obesity: Body mass index is 61.35 kg/m.  - I think he would be an excellent GLP-1 agonist candidate but unfortunately may not get insurance coverage.  I will ask our pharmacy clinic to check into semaglutide vs tirzepatide for him.  7. HTN: BP elevated today but he is off several medd. - Restart meds as above.   Followup 6 wks with APP.   Marca Ancona  07/30/2022

## 2022-08-01 ENCOUNTER — Encounter: Payer: BC Managed Care – PPO | Admitting: Clinical

## 2022-08-08 ENCOUNTER — Other Ambulatory Visit (HOSPITAL_COMMUNITY): Payer: BC Managed Care – PPO

## 2022-08-14 ENCOUNTER — Other Ambulatory Visit (HOSPITAL_COMMUNITY): Payer: Self-pay

## 2022-08-14 NOTE — Telephone Encounter (Signed)
Hey Ce Ce is there a way we can ce ce Deanna on here so she can see if she qualifies for RW/UMAP

## 2022-08-19 ENCOUNTER — Telehealth: Payer: Self-pay

## 2022-08-19 ENCOUNTER — Other Ambulatory Visit (HOSPITAL_COMMUNITY): Payer: Self-pay

## 2022-08-19 NOTE — Telephone Encounter (Signed)
Called pt left message to call about so I can see about helping since pt has lost insurance.

## 2022-08-21 ENCOUNTER — Other Ambulatory Visit: Payer: Self-pay | Admitting: Pharmacist

## 2022-08-21 DIAGNOSIS — B20 Human immunodeficiency virus [HIV] disease: Secondary | ICD-10-CM

## 2022-08-21 MED ORDER — BICTEGRAVIR-EMTRICITAB-TENOFOV 50-200-25 MG PO TABS: 1.0000 | ORAL_TABLET | Freq: Every day | ORAL | 0 refills | Status: AC

## 2022-08-21 NOTE — Progress Notes (Signed)
Medication Samples have been provided to the patient.  Drug name: Biktarvy        Strength: 50/200/25 mg       Qty: 14 tablets (2 bottles) LOT: CPP2HA   Exp.Date: 8/26  Dosing instructions: Take one tablet by mouth once daily  The patient has been instructed regarding the correct time, dose, and frequency of taking this medication, including desired effects and most common side effects.   Monserat Prestigiacomo, PharmD, CPP, BCIDP, AAHIVP Clinical Pharmacist Practitioner Infectious Diseases Clinical Pharmacist [facility]  

## 2022-08-22 ENCOUNTER — Ambulatory Visit: Payer: BC Managed Care – PPO | Admitting: Clinical

## 2022-09-06 ENCOUNTER — Telehealth (HOSPITAL_COMMUNITY): Payer: Self-pay

## 2022-09-06 NOTE — Telephone Encounter (Signed)
Called to confirm/remind patient of their appointment at the [facility] on 09/09/22.   Patient reminded to bring all medications and/or complete list.  Confirmed patient has transportation. Gave directions, instructed to utilize valet parking.  Confirmed appointment prior to ending call.

## 2022-09-09 ENCOUNTER — Encounter (HOSPITAL_COMMUNITY): Payer: BC Managed Care – PPO

## 2022-09-09 NOTE — Progress Notes (Deleted)
Office Visit    Patient Name: Todd Griffith Date of Encounter: 09/09/2022  Primary Care Provider:  Ivonne Andrew, NP Primary Cardiologist:  None  Chief Complaint    Weight management  Significant Past Medical History                    No Known Allergies  History of Present Illness    Todd Griffith is a 32 y.o. male patient of Dr Shirlee Latch,  *** If diabetic and on insulin/sulfonylurea, can consider reducing dose to reduce risk of hypoglycemia  *** Follow-up visit  Assess % weight loss Assess adverse effects Missed doses  Current weight management medications:   Previously tried meds:   Current meds that may affect weight:   Baseline weight/BMI:   Insurance payor:   Diet:   Exercise:   Family History:   Confirmed patient not ***pregnant and no personal or family history of medullary thyroid carcinoma (MTC) or Multiple Endocrine Neoplasia syndrome type 2 (MEN 2).   Social History:   Tobacco:  Alcohol:  Caffeine:   Adherence Assessment  Do you ever forget to take your medication? [] Yes [] No  Do you ever skip doses due to side effects? [] Yes [] No  Do you have trouble affording your medicines? [] Yes [] No  Are you ever unable to pick up your medication due to transportation difficulties? [] Yes [] No  Do you ever stop taking your medications because you don't believe they are helping? [] Yes [] No  Do you check your weight daily? [] Yes [] No   Adherence strategy: ***  Barriers to obtaining medications: ***   Accessory Clinical Findings    Lab Results  Component Value Date   CREATININE 1.22 07/29/2022   BUN 14 07/29/2022   NA 139 07/29/2022   K 4.1 07/29/2022   CL 105 07/29/2022   CO2 26 07/29/2022   Lab Results  Component Value Date   ALT 27 05/22/2022   AST 22 05/22/2022   ALKPHOS 82 02/16/2021   BILITOT 1.0 05/22/2022   Lab Results  Component Value Date   HGBA1C 5.2 11/10/2020      Home  Medications/Allergies    Current Outpatient Medications  Medication Sig Dispense Refill   albuterol (VENTOLIN HFA) 108 (90 Base) MCG/ACT inhaler Inhale 1-2 puffs into the lungs every 6 (six) hours as needed for wheezing or shortness of breath. 6.7 g 2   atorvastatin (LIPITOR) 20 MG tablet Take 1 tablet (20 mg total) by mouth daily. NEEDS FOLLOW UP APPOINTMENT FOR MORE REFILLS 90 tablet 1   bictegravir-emtricitabine-tenofovir AF (BIKTARVY) 50-200-25 MG TABS tablet Take 1 tablet by mouth daily. 30 tablet 6   bisoprolol (ZEBETA) 10 MG tablet Take 1 tablet (10 mg total) by mouth daily. 90 tablet 3   [facility] ASPIRIN ADULT LOW DOSE 81 MG chewable tablet CHEW 1 TABLET BY MOUTH DAILY. 30 tablet 11   FARXIGA 10 MG TABS tablet TAKE 1 TABLET BY MOUTH DAILY BEFORE BREAKFAST. 90 tablet 1   fluticasone (FLONASE) 50 MCG/ACT nasal spray Place 2 sprays into both nostrils daily. 16 g 0   furosemide (LASIX) 80 MG tablet Take 1 tablet (80 mg total) by mouth daily. 90 tablet 2   isosorbide-hydrALAZINE (BIDIL) 20-37.5 MG tablet Take 1 tablet by mouth 3 (three) times daily. 90 tablet 3   sacubitril-valsartan (ENTRESTO) 97-103 MG Take 1 tablet by mouth 2 (two) times daily. 180 tablet 2   spironolactone (ALDACTONE) 25 MG tablet Take 1 tablet (25 mg  total) by mouth daily. 90 tablet 3   No current facility-administered medications for this visit.     No Known Allergies  Assessment & Plan    No problem-specific Assessment & Plan notes found for this encounter.   Phillips Hay PharmD CPP [facility]  [address] [place], [place] [address] [phone]

## 2022-09-10 ENCOUNTER — Encounter: Payer: Self-pay | Admitting: Cardiology

## 2022-09-10 ENCOUNTER — Ambulatory Visit: Payer: 59 | Attending: Cardiology

## 2022-09-12 ENCOUNTER — Other Ambulatory Visit (HOSPITAL_COMMUNITY): Payer: Self-pay | Admitting: Family Medicine

## 2022-09-23 ENCOUNTER — Telehealth (HOSPITAL_COMMUNITY): Payer: Self-pay

## 2022-09-23 NOTE — Progress Notes (Signed)
PCP: Ivonne Andrew, NP Cardiology: Dr. Shirlee Latch  32 y.o. with history of asthma, OSA, HIV+, and nonischemic cardiomyopathy returns for followup of CHF.  He was admitted in 6/20 with dyspnea and found to have CHF.  Echo showed EF 15-20% with diffuse hypokinesis, moderate-severe MR. LHC/RHC showed mild coronary disease, preserved cardiac output.  He was also diagnosed with HIV and started on Biktarvy.   Echo in 10/20 showed EF up to 35-40%, MR is only trivial.  Echo in 2/21 showed EF 35-40% with normal RV.  Echo 7/23 showed EF up to 45% with normal RV.   Echo was done today showing EF 40-45%, mild LV dilation, global hypokinesis, normal RV.   Today he returns for HF follow up. He has been off bisoprolol, Bidil, and spironolactone for "months."  Says the pharmacy quit refilling them and he assumed that we wanted him to stop them.  BP elevated today.  He is using his CPAP regularly.  His weight is up about 22 lbs.  He has no dyspnea with his normal activities.  He walks for exercise. No chest pain.  No lightheadedness.  No orthopnea/PND.    ECG (personally reviewed): NSR, normal  Labs (9/20): K 4.2, creatinine 1.48 Labs (10/20): LDL 73, HDL 26 Labs (11/20): K 4.1, creatinine 1.39 Labs (12/20): K 4.2, creatinine 1.43 Labs (1/22): K 3.8, creatinine 1.32, LDL 58, HDL 25 Labs (4/22): K 3.8, creatinine 1.4 Labs (7/23): K 4.2, creatinine 1.36, LFTs normal  Labs (9/23): LDL 80 Labs (2/24): K 4.1, creatinine 1.10  PMH: 1. Chronic systolic CHF: Nonischemic cardiomyopathy.   - LHC/RHC (6/20): mean RA 4, mean PCWP 21, CI 3; 70% D1 stenosis.  - Echo (6/20): EF 15-20%, normal RV, moderate-severe MR.  - Echo (10/20): EF 35-40% with diffuse hypokinesis, normal RV, trivial MR.   - Echo (2/21): EF 35-40%, diffuse hypokinesis, normal RV, normal IVC - Echo (7/22): EF 45%, normal RV.  - Echo (4/24): EF 40-45%, mild LV dilation, global hypokinesis, normal RV. 2. HIV+: Followed by ID.  3. H/o ETOH abuse.  4.  Asthma: Since childhood.  5. HTN 6. Morbid obesity.  7. OSA: Uses CPAP.  8. CAD: LHC in 6/20 with 70% D1 stenosis.   FH: Father, grandf ather with CAD, no cardiomyopathy.   SH: Lives in [place] with girlfriend and child, works for [employer] of [place].  Nonsmoker.  Prior heavy ETOH, now drinks some on weekend but much less. Nonsmoker, no drugs.   ROS: All systems reviewed and negative except as per HPI.   Current Outpatient Medications  Medication Sig Dispense Refill   albuterol (VENTOLIN HFA) 108 (90 Base) MCG/ACT inhaler Inhale 1-2 puffs into the lungs every 6 (six) hours as needed for wheezing or shortness of breath. 6.7 g 2   atorvastatin (LIPITOR) 20 MG tablet Take 1 tablet (20 mg total) by mouth daily. NEEDS FOLLOW UP APPOINTMENT FOR MORE REFILLS 90 tablet 1   bictegravir-emtricitabine-tenofovir AF (BIKTARVY) 50-200-25 MG TABS tablet Take 1 tablet by mouth daily. 30 tablet 6   bisoprolol (ZEBETA) 10 MG tablet Take 1 tablet (10 mg total) by mouth daily. 90 tablet 3   CVS ASPIRIN ADULT LOW DOSE 81 MG chewable tablet CHEW 1 TABLET BY MOUTH DAILY. 30 tablet 11   dapagliflozin propanediol (FARXIGA) 10 MG TABS tablet TAKE 1 TABLET BY MOUTH DAILY BEFORE BREAKFAST. 90 tablet 3   fluticasone (FLONASE) 50 MCG/ACT nasal spray Place 2 sprays into both nostrils daily. 16 g 0  furosemide (LASIX) 80 MG tablet Take 1 tablet (80 mg total) by mouth daily. 90 tablet 2   isosorbide-hydrALAZINE (BIDIL) 20-37.5 MG tablet Take 1 tablet by mouth 3 (three) times daily. 90 tablet 3   sacubitril-valsartan (ENTRESTO) 97-103 MG Take 1 tablet by mouth 2 (two) times daily. 180 tablet 2   spironolactone (ALDACTONE) 25 MG tablet Take 1 tablet (25 mg total) by mouth daily. 90 tablet 3   No current facility-administered medications for this visit.   Wt Readings from Last 3 Encounters:  07/29/22 (!) 194 kg (427 lb 9.6 oz)  05/22/22 (!) 191.4 kg (422 lb)  04/24/22 (!) 197.1 kg (434 lb 9.6 oz)   There were no vitals  taken for this visit. General: NAD, obese.  Neck: Thick. No JVD, no thyromegaly or thyroid nodule.  Lungs: Clear to auscultation bilaterally with normal respiratory effort. CV: Nondisplaced PMI.  Heart regular S1/S2, no S3/S4, no murmur.  No peripheral edema.  No carotid bruit.  Normal pedal pulses.  Abdomen: Soft, nontender, no hepatosplenomegaly, no distention.  Skin: Intact without lesions or rashes.  Neurologic: Alert and oriented x 3.  Psych: Normal affect. Extremities: No clubbing or cyanosis.  HEENT: Normal.   Assessment/Plan: 1. Chronic systolic CHF: Nonischemic cardiomyopathy.  Echo in 6/20 showed EF 15-20% with moderate-severe MR.  RHC/LHC indicated nonischemic cardiomyopathy, cardiac output preserved. Cause of cardiomyopathy is uncertain, he was a heavy drinker in the past.  He has also been found to have HIV, which can cause a cardiomyopathy, but he has been on meds for this and it is controlled.  Echo in 10/20 showed EF improved up to 35-40%.  Echo in 2/21 showed EF 35-40% still.  Echo 7/22 showed EF up to 45%.  Echo today showed EF 40-45%.  He has been off bisoprolol, Bidil, and spironolactone x months.  He is not volume overloaded on exam, stable NYHA class I-II.  - Restart bisoprolol 10 mg daily.  - Continue Entresto 97/103 mg bid.  - Restart spironolactone 25 mg daily. BMET/BNP today, BMET 10 days.  - Restart Bidil at 1 tab tid.  - Continue dapagliflozin 10 mg daily. No GU symptoms. - Continue Lasix 80 daily.     - EF is outside of ICD range.      - I do not think that he would fit in scanner for cardiac MRI.  2. CAD: LHC (6/20) showed 70% D1 stenosis.   - Continue ASA 81 daily.  - Continue atorvastatin 20 mg daily. Good lipids in 1/22. Check lipids today. 3. Mitral regurgitation: Much improved on most recent echoes.  4. ETOH abuse: Prior ETOH abuse may have contributed to his cardiomyopathy. He has cut back considerably.  5. OSA: Continue to use CPAP.  6. Obesity: There  is no height or weight on file to calculate BMI.  - I think he would be an excellent GLP-1 agonist candidate but unfortunately may not get insurance coverage.  I will ask our pharmacy clinic to check into semaglutide vs tirzepatide for him.  7. HTN: BP elevated today but he is off several medd. - Restart meds as above.   Followup 6 wks with APP.   Anderson Malta [facility] [place]  09/23/2022

## 2022-09-23 NOTE — Telephone Encounter (Signed)
Called to confirm/remind patient of their appointment at the [facility] on 09/24/22.   Patient reminded to bring all medications and/or complete list.  Confirmed patient has transportation. Gave directions, instructed to utilize valet parking.  Confirmed appointment prior to ending call.

## 2022-09-24 ENCOUNTER — Ambulatory Visit (HOSPITAL_COMMUNITY)
Admission: RE | Admit: 2022-09-24 | Discharge: 2022-09-24 | Disposition: A | Discharge status: Home / Self Care | Payer: 59 | Source: Ambulatory Visit | Attending: Family Medicine | Admitting: Family Medicine

## 2022-09-24 ENCOUNTER — Encounter (HOSPITAL_COMMUNITY): Payer: Self-pay

## 2022-09-24 ENCOUNTER — Telehealth: Payer: Self-pay | Admitting: Pharmacist

## 2022-09-24 VITALS — BP 138/88 | HR 70 | Wt >= 6400 oz

## 2022-09-24 DIAGNOSIS — Z7982 Long term (current) use of aspirin: Secondary | ICD-10-CM | POA: Insufficient documentation

## 2022-09-24 DIAGNOSIS — Z8249 Family history of ischemic heart disease and other diseases of the circulatory system: Secondary | ICD-10-CM | POA: Diagnosis not present

## 2022-09-24 DIAGNOSIS — Z79899 Other long term (current) drug therapy: Secondary | ICD-10-CM | POA: Insufficient documentation

## 2022-09-24 DIAGNOSIS — I110 Hypertensive heart disease with heart failure: Secondary | ICD-10-CM | POA: Diagnosis present

## 2022-09-24 DIAGNOSIS — G4733 Obstructive sleep apnea (adult) (pediatric): Secondary | ICD-10-CM

## 2022-09-24 DIAGNOSIS — Z6841 Body Mass Index (BMI) 40.0 and over, adult: Secondary | ICD-10-CM | POA: Diagnosis not present

## 2022-09-24 DIAGNOSIS — E669 Obesity, unspecified: Secondary | ICD-10-CM | POA: Diagnosis not present

## 2022-09-24 DIAGNOSIS — R300 Dysuria: Secondary | ICD-10-CM | POA: Diagnosis not present

## 2022-09-24 DIAGNOSIS — I340 Nonrheumatic mitral (valve) insufficiency: Secondary | ICD-10-CM | POA: Diagnosis not present

## 2022-09-24 DIAGNOSIS — I2510 Atherosclerotic heart disease of native coronary artery without angina pectoris: Secondary | ICD-10-CM | POA: Diagnosis not present

## 2022-09-24 DIAGNOSIS — I428 Other cardiomyopathies: Secondary | ICD-10-CM | POA: Insufficient documentation

## 2022-09-24 DIAGNOSIS — F1010 Alcohol abuse, uncomplicated: Secondary | ICD-10-CM | POA: Diagnosis not present

## 2022-09-24 DIAGNOSIS — I10 Essential (primary) hypertension: Secondary | ICD-10-CM

## 2022-09-24 DIAGNOSIS — I5022 Chronic systolic (congestive) heart failure: Secondary | ICD-10-CM

## 2022-09-24 LAB — BASIC METABOLIC PANEL
Anion gap: 8 (ref 5–15)
BUN: 17 mg/dL (ref 6–20)
CO2: 26 mmol/L (ref 22–32)
Calcium: 9.1 mg/dL (ref 8.9–10.3)
Chloride: 101 mmol/L (ref 98–111)
Creatinine, Ser: 1.2 mg/dL (ref 0.61–1.24)
GFR, Estimated: >60 mL/min (ref >60)
Glucose, Bld: 97 mg/dL (ref 70–99)
Potassium: 4.1 mmol/L (ref 3.5–5.1)
Sodium: 135 mmol/L (ref 135–145)

## 2022-09-24 LAB — URINALYSIS, ROUTINE W REFLEX MICROSCOPIC
Bilirubin Urine: NEGATIVE
Glucose, UA: 50 mg/dL — AB
Hgb urine dipstick: NEGATIVE
Ketones, ur: NEGATIVE mg/dL
Nitrite: NEGATIVE
Protein, ur: 30 mg/dL — AB
Specific Gravity, Urine: 1.02 (ref 1.005–1.030)
WBC, UA: >50 WBC/hpf (ref 0–5)
pH: 6 (ref 5.0–8.0)

## 2022-09-24 NOTE — Telephone Encounter (Signed)
PharmD saw pt back in 2022 and started him on semaglutide but he didn't return repeated calls for dose titrations and was lost to follow up. He was using Ozempic off label for weight loss at that time. He does not have diabetes, insurance no longer allowing off label use and requiring prior authorizations now. Will need to submit for a weight loss GLP, will submit request for Wegovy (pt also has CAD - 70% stenosis on 2020 cath). Key [id number].

## 2022-09-24 NOTE — Patient Instructions (Addendum)
Thank you for coming in today  If you had labs drawn today, any labs that are abnormal the clinic will call you No news is good news  Medications: You have been referred to pharmacy for GLP1 they will contact you for further information  Follow up appointments:  Your physician recommends that you schedule a follow-up appointment in:  6 months With Dr. Earlean Shawl will receive a reminder letter in the mail a few months in advance. If you don't receive a letter, please call our office to schedule the follow-up appointment.    Do the following things EVERYDAY: Weigh yourself in the morning before breakfast. Write it down and keep it in a log. Take your medicines as prescribed Eat low salt foods--Limit salt (sodium) to 2000 mg per day.  Stay as active as you can everyday Limit all fluids for the day to less than 2 liters   At the [facility], you and your health needs are our priority. As part of our continuing mission to provide you with exceptional heart care, we have created designated Provider Care Teams. These Care Teams include your primary Cardiologist (physician) and Advanced Practice Providers (APPs- Physician Assistants and Nurse Practitioners) who all work together to provide you with the care you need, when you need it.   You may see any of the following providers on your designated Care Team at your next follow up: Dr Arvilla Meres Dr Marca Ancona Dr. Marcos Eke, NP Robbie Lis, [place] [facility] [place], [place] Brynda Peon, NP Karle Plumber, PharmD   Please be sure to bring in all your medications bottles to every appointment.    Thank you for choosing [facility]  If you have any questions or concerns before your next appointment please send [place] a message through [place] or call our office at [phone].    TO LEAVE A MESSAGE FOR THE NURSE SELECT OPTION 2, PLEASE LEAVE A  MESSAGE INCLUDING: YOUR NAME DATE OF BIRTH CALL BACK NUMBER REASON FOR CALL**this is important as we prioritize the call backs  YOU WILL RECEIVE A CALL BACK THE SAME DAY AS LONG AS YOU CALL BEFORE 4:00 PM

## 2022-09-24 NOTE — Telephone Encounter (Signed)
-----   Message from Demetrius Charity, RN sent at 09/24/2022  9:44 AM EDT ----- Regarding: GLP1 referral Greetings,  Patient was seen in the clinic today by Prince Rome NP and she wants him to be referred for Semaglutide vs. Tirepatide.  Thanks,  Astronomer

## 2022-09-25 ENCOUNTER — Other Ambulatory Visit (HOSPITAL_COMMUNITY): Payer: Self-pay | Admitting: Family Medicine

## 2022-09-25 LAB — URINE CULTURE: Culture: >=100000 — AB

## 2022-09-25 MED ORDER — SULFAMETHOXAZOLE-TRIMETHOPRIM 800-160 MG PO TABS: 1.0000 | ORAL_TABLET | Freq: Two times a day (BID) | ORAL | 0 refills | Status: AC

## 2022-09-27 NOTE — Telephone Encounter (Signed)
[place] prior auth denied, his plan does not cover weight loss drugs (even with submission for CAD indication). Insurance no longer allowing for off label Ozempic use for patients without diabetes.   Called pt to make him aware, no answer, left detailed message.

## 2022-10-08 ENCOUNTER — Ambulatory Visit: Payer: 59

## 2022-10-15 ENCOUNTER — Other Ambulatory Visit (HOSPITAL_COMMUNITY): Payer: Self-pay | Admitting: Cardiology

## 2022-12-31 ENCOUNTER — Other Ambulatory Visit (HOSPITAL_COMMUNITY): Payer: Self-pay | Admitting: Family Medicine

## 2023-01-02 ENCOUNTER — Other Ambulatory Visit (HOSPITAL_COMMUNITY): Payer: Self-pay | Admitting: Family Medicine

## 2023-01-24 ENCOUNTER — Other Ambulatory Visit (HOSPITAL_COMMUNITY): Payer: Self-pay

## 2023-01-24 DIAGNOSIS — I5022 Chronic systolic (congestive) heart failure: Secondary | ICD-10-CM

## 2023-01-24 MED ORDER — DAPAGLIFLOZIN PROPANEDIOL 10 MG PO TABS: 10.0000 mg | ORAL_TABLET | Freq: Every day | ORAL | 3 refills | Status: DC

## 2023-02-10 ENCOUNTER — Other Ambulatory Visit: Payer: Self-pay

## 2023-02-10 ENCOUNTER — Other Ambulatory Visit (HOSPITAL_COMMUNITY): Payer: Self-pay

## 2023-02-10 ENCOUNTER — Ambulatory Visit: Payer: 59

## 2023-02-10 DIAGNOSIS — B20 Human immunodeficiency virus [HIV] disease: Secondary | ICD-10-CM

## 2023-02-10 MED ORDER — BIKTARVY 50-200-25 MG PO TABS: 1.0000 | ORAL_TABLET | Freq: Every day | ORAL | 0 refills | Status: DC

## 2023-02-11 ENCOUNTER — Other Ambulatory Visit: Payer: Self-pay | Admitting: Family

## 2023-02-11 ENCOUNTER — Other Ambulatory Visit: Payer: Self-pay

## 2023-02-11 ENCOUNTER — Other Ambulatory Visit: Payer: 59

## 2023-02-11 ENCOUNTER — Other Ambulatory Visit (HOSPITAL_COMMUNITY)
Admission: RE | Admit: 2023-02-11 | Discharge: 2023-02-11 | Disposition: A | Discharge status: Home / Self Care | Payer: 59 | Source: Ambulatory Visit | Attending: Family | Admitting: Family

## 2023-02-11 DIAGNOSIS — Z113 Encounter for screening for infections with a predominantly sexual mode of transmission: Secondary | ICD-10-CM | POA: Diagnosis present

## 2023-02-11 DIAGNOSIS — B20 Human immunodeficiency virus [HIV] disease: Secondary | ICD-10-CM | POA: Diagnosis present

## 2023-02-11 DIAGNOSIS — Z79899 Other long term (current) drug therapy: Secondary | ICD-10-CM | POA: Diagnosis present

## 2023-02-12 ENCOUNTER — Other Ambulatory Visit: Payer: Self-pay | Admitting: Pharmacist

## 2023-02-12 DIAGNOSIS — B20 Human immunodeficiency virus [HIV] disease: Secondary | ICD-10-CM

## 2023-02-12 LAB — URINE CYTOLOGY ANCILLARY ONLY
Chlamydia: NEGATIVE
Comment: NEGATIVE
Comment: NORMAL
Neisseria Gonorrhea: NEGATIVE

## 2023-02-12 LAB — T-HELPER CELL (CD4) - (RCID CLINIC ONLY)
CD4 % Helper T Cell: 42 % (ref 33–65)
CD4 T Cell Abs: 1164 /uL (ref 400–1790)

## 2023-02-12 MED ORDER — BIKTARVY 50-200-25 MG PO TABS: 1.0000 | ORAL_TABLET | Freq: Every day | ORAL | Status: AC

## 2023-02-12 NOTE — Progress Notes (Signed)
Medication Samples have been provided to the patient.  Drug name: Biktarvy        Strength: 50/200/25 mg       Qty: 1 bottle (7 tablets)   LOT: [id number]   Exp.Date: 12/2024  Dosing instructions: Take one tablet by mouth once daily  The patient has been instructed regarding the correct time, dose, and frequency of taking this medication, including desired effects and most common side effects.   Shamere Campas L. Jannette Fogo, PharmD, BCIDP, AAHIVP, CPP Clinical Pharmacist Practitioner Infectious Diseases Clinical Pharmacist [facility] 03/13/2020, 10:07 AM

## 2023-02-14 LAB — CBC WITH DIFFERENTIAL/PLATELET
Absolute Lymphocytes: 3139 {cells}/uL (ref 850–3900)
Absolute Monocytes: 568 {cells}/uL (ref 200–950)
Basophils Absolute: 26 {cells}/uL (ref 0–200)
Basophils Relative: 0.3 %
Eosinophils Absolute: 679 {cells}/uL — ABNORMAL HIGH (ref 15–500)
Eosinophils Relative: 7.9 %
HCT: 39.9 % (ref 38.5–50.0)
Hemoglobin: 13.3 g/dL (ref 13.2–17.1)
MCH: 32.2 pg (ref 27.0–33.0)
MCHC: 33.3 g/dL (ref 32.0–36.0)
MCV: 96.6 fL (ref 80.0–100.0)
MPV: 9.8 fL (ref 7.5–12.5)
Monocytes Relative: 6.6 %
Neutro Abs: 4188 {cells}/uL (ref 1500–7800)
Neutrophils Relative %: 48.7 %
Platelets: 287 10*3/uL (ref 140–400)
RBC: 4.13 10*6/uL — ABNORMAL LOW (ref 4.20–5.80)
RDW: 12.5 % (ref 11.0–15.0)
Total Lymphocyte: 36.5 %
WBC: 8.6 10*3/uL (ref 3.8–10.8)

## 2023-02-14 LAB — COMPLETE METABOLIC PANEL WITH GFR
AG Ratio: 1.5 (calc) (ref 1.0–2.5)
ALT: 28 U/L (ref 9–46)
AST: 21 U/L (ref 10–40)
Albumin: 4.1 g/dL (ref 3.6–5.1)
Alkaline phosphatase (APISO): 92 U/L (ref 36–130)
BUN: 16 mg/dL (ref 7–25)
CO2: 26 mmol/L (ref 20–32)
Calcium: 9.1 mg/dL (ref 8.6–10.3)
Chloride: 106 mmol/L (ref 98–110)
Creat: 0.97 mg/dL (ref 0.60–1.26)
Globulin: 2.7 g/dL (ref 1.9–3.7)
Glucose, Bld: 90 mg/dL (ref 65–99)
Potassium: 4.3 mmol/L (ref 3.5–5.3)
Sodium: 141 mmol/L (ref 135–146)
Total Bilirubin: 0.8 mg/dL (ref 0.2–1.2)
Total Protein: 6.8 g/dL (ref 6.1–8.1)
eGFR: 106 mL/min/{1.73_m2} (ref >=60)

## 2023-02-14 LAB — RPR: RPR Ser Ql: NONREACTIVE

## 2023-02-14 LAB — HIV-1 RNA QUANT-NO REFLEX-BLD
HIV 1 RNA Quant: NOT DETECTED {copies}/mL
HIV-1 RNA Quant, Log: NOT DETECTED {Log_copies}/mL

## 2023-02-25 ENCOUNTER — Ambulatory Visit: Payer: 59 | Admitting: Family

## 2023-03-07 ENCOUNTER — Ambulatory Visit: Payer: 59 | Admitting: Nurse Practitioner

## 2023-03-17 ENCOUNTER — Other Ambulatory Visit: Payer: Self-pay | Admitting: Family

## 2023-03-17 DIAGNOSIS — B20 Human immunodeficiency virus [HIV] disease: Secondary | ICD-10-CM

## 2023-03-31 ENCOUNTER — Ambulatory Visit: Payer: 59

## 2023-03-31 ENCOUNTER — Ambulatory Visit: Payer: 59 | Admitting: Family

## 2023-04-09 ENCOUNTER — Ambulatory Visit: Payer: 59

## 2023-04-09 ENCOUNTER — Ambulatory Visit: Payer: 59 | Admitting: Internal Medicine

## 2023-04-09 ENCOUNTER — Other Ambulatory Visit: Payer: Self-pay

## 2023-04-18 ENCOUNTER — Other Ambulatory Visit: Payer: Self-pay | Admitting: Family

## 2023-04-18 DIAGNOSIS — B20 Human immunodeficiency virus [HIV] disease: Secondary | ICD-10-CM

## 2023-04-18 NOTE — Telephone Encounter (Signed)
Last dispensed 04/08/23, additional refills at 04/22/23 appt.  Sandie Ano, RN

## 2023-04-22 ENCOUNTER — Ambulatory Visit (INDEPENDENT_AMBULATORY_CARE_PROVIDER_SITE_OTHER): Payer: 59 | Admitting: Family

## 2023-04-22 ENCOUNTER — Other Ambulatory Visit: Payer: Self-pay

## 2023-04-22 ENCOUNTER — Encounter: Payer: Self-pay | Admitting: Family

## 2023-04-22 VITALS — BP 149/81 | HR 75 | Temp 97.2°F | Resp 16 | Wt >= 6400 oz

## 2023-04-22 DIAGNOSIS — Z6841 Body Mass Index (BMI) 40.0 and over, adult: Secondary | ICD-10-CM

## 2023-04-22 DIAGNOSIS — F4321 Adjustment disorder with depressed mood: Secondary | ICD-10-CM | POA: Diagnosis not present

## 2023-04-22 DIAGNOSIS — Z0000 Encounter for general adult medical examination without abnormal findings: Secondary | ICD-10-CM

## 2023-04-22 DIAGNOSIS — B20 Human immunodeficiency virus [HIV] disease: Secondary | ICD-10-CM

## 2023-04-22 MED ORDER — BIKTARVY 50-200-25 MG PO TABS: 1.0000 | ORAL_TABLET | Freq: Every day | ORAL | 6 refills | Status: DC

## 2023-04-22 NOTE — Patient Instructions (Signed)
Nice to see you. ? ?We will check your lab work today. ? ?Continue to take your medication daily as prescribed. ? ?Refills have been sent to the pharmacy. ? ?Plan for follow up in 6 months or sooner if needed with lab work on the same day. ? ?Have a great day and stay safe! ? ?

## 2023-04-22 NOTE — Assessment & Plan Note (Signed)
Mr. Blocker continues to have well-controlled virus with good adherence and tolerance to [employer].  Reviewed previous lab work and discussed plan of care and U equals U.  Check blood work.  Continue current dose of Biktarvy.  Plan for follow-up in 6 months or sooner if needed with lab work on the same day.

## 2023-04-22 NOTE — Progress Notes (Signed)
Brief Narrative   Patient ID: Todd Griffith, male    DOB: [birth year], 33 y.o.   MRN: [id number]  Mr. Todd Griffith if a 33 y/o AA male diagnosed with HIV disease in June of 2020 with risk factor being heterosexual contact. Initial viral load of 739 with CD4 count of 41,600.  Genotype never resulted. Entered care at [facility] Stage 1. No history of opportunistic infection. [id number] negative. Sole ART regimen of Biktarvy.    Subjective:    Chief Complaint  Patient presents with   Follow-up    B20     HPI:  Todd Griffith is a 33 y.o. male with HIV disease last seen on 05/22/2022 with well-controlled virus and good adherence and tolerance to [employer].  Viral load was undetectable with CD4 count of 1306.  Most recent lab work completed on 02/11/2023 with viral load that remains undetectable and CD4 count of 1164.  Renal function, hepatic function, electrolytes within normal ranges.  Here today for routine follow-up.  Mr. Todd Griffith has been doing well since his last office visit and unfortunately recently lost his close friend and is in the grieving process.  Continues to take Biktarvy as prescribed with no adverse side effects.  Did have an issue getting Biktarvy from the pharmacy which has since been resolved.  Planning on getting gastric bypass surgery in June or sometime this summer.  Condoms and site-specific STD testing offered.  Healthcare maintenance reviewed.  Due for routine dental care which she will schedule independently  Denies fevers, chills, night sweats, headaches, changes in vision, neck pain/stiffness, nausea, diarrhea, vomiting, lesions or rashes.  Lab Results  Component Value Date   CD4TCELL 42 02/11/2023   CD4TABS 1,164 02/11/2023   Lab Results  Component Value Date   HIV1RNAQUANT Not Detected 02/11/2023     No Known Allergies    Outpatient Medications Prior to Visit  Medication Sig Dispense Refill   albuterol (VENTOLIN HFA) 108 (90 Base) MCG/ACT inhaler  Inhale 1-2 puffs into the lungs every 6 (six) hours as needed for wheezing or shortness of breath. 6.7 g 2   atorvastatin (LIPITOR) 20 MG tablet Take 1 tablet (20 mg total) by mouth daily. 90 tablet 3   bisoprolol (ZEBETA) 10 MG tablet Take 1 tablet (10 mg total) by mouth daily. 90 tablet 3   dapagliflozin propanediol (FARXIGA) 10 MG TABS tablet Take 1 tablet (10 mg total) by mouth daily before breakfast. 90 tablet 3   fluticasone (FLONASE) 50 MCG/ACT nasal spray Place 2 sprays into both nostrils daily. 16 g 0   furosemide (LASIX) 80 MG tablet TAKE 1 TABLET BY MOUTH EVERY DAY 90 tablet 2   isosorbide-hydrALAZINE (BIDIL) 20-37.5 MG tablet TAKE 1 TABLET BY MOUTH THREE TIMES A DAY 270 tablet 1   sacubitril-valsartan (ENTRESTO) 97-103 MG TAKE 1 TABLET BY MOUTH TWICE A DAY 180 tablet 3   spironolactone (ALDACTONE) 25 MG tablet Take 1 tablet (25 mg total) by mouth daily. 90 tablet 3   BIKTARVY 50-200-25 MG TABS tablet TAKE 1 TABLET BY MOUTH 1 TIME A DAY 30 tablet 0   CVS ASPIRIN ADULT LOW DOSE 81 MG chewable tablet CHEW 1 TABLET BY MOUTH DAILY. (Patient not taking: Reported on 04/22/2023) 30 tablet 11   No facility-administered medications prior to visit.     Past Medical History:  Diagnosis Date   Asthma    Bronchitis    CHF (congestive heart failure) (HCC)    HIV (human immunodeficiency virus infection) (HCC)  Obstructive sleep apnea      Past Surgical History:  Procedure Laterality Date   RIGHT/LEFT HEART CATH AND CORONARY ANGIOGRAPHY N/A 09/21/2018   Procedure: RIGHT/LEFT HEART CATH AND CORONARY ANGIOGRAPHY;  Surgeon: Marykay Lex, MD;  Location: [facility] INVASIVE CV LAB;  Service: Cardiovascular;  Laterality: N/A;      Review of Systems  Constitutional:  Negative for appetite change, chills, fatigue, fever and unexpected weight change.  Eyes:  Negative for visual disturbance.  Respiratory:  Negative for cough, chest tightness, shortness of breath and wheezing.   Cardiovascular:   Negative for chest pain and leg swelling.  Gastrointestinal:  Negative for abdominal pain, constipation, diarrhea, nausea and vomiting.  Genitourinary:  Negative for dysuria, flank pain, frequency, genital sores, hematuria and urgency.  Skin:  Negative for rash.  Allergic/Immunologic: Negative for immunocompromised state.  Neurological:  Negative for dizziness and headaches.      Objective:    BP (!) 149/81   Pulse 75   Temp (!) 97.2 F (36.2 C) (Temporal)   Resp 16   Wt (!) 454 lb 6.4 oz (206.1 kg)   SpO2 95%   BMI 65.20 kg/m  Nursing note and vital signs reviewed.  Physical Exam Constitutional:      General: He is not in acute distress.    Appearance: He is well-developed.  Eyes:     Conjunctiva/sclera: Conjunctivae normal.  Cardiovascular:     Rate and Rhythm: Normal rate and regular rhythm.     Heart sounds: Normal heart sounds. No murmur heard.    No friction rub. No gallop.  Pulmonary:     Effort: Pulmonary effort is normal. No respiratory distress.     Breath sounds: Normal breath sounds. No wheezing or rales.  Chest:     Chest wall: No tenderness.  Abdominal:     General: Bowel sounds are normal.     Palpations: Abdomen is soft.     Tenderness: There is no abdominal tenderness.  Musculoskeletal:     Cervical back: Neck supple.  Lymphadenopathy:     Cervical: No cervical adenopathy.  Skin:    General: Skin is warm and dry.     Findings: No rash.  Neurological:     Mental Status: He is alert and oriented to person, place, and time.  Psychiatric:        Behavior: Behavior normal.        Thought Content: Thought content normal.        Judgment: Judgment normal.         04/22/2023    9:33 AM 05/22/2022    2:00 PM 05/14/2022   11:14 AM 04/12/2022    2:56 PM 10/29/2021    3:36 PM  Depression screen PHQ 2/9  Decreased Interest 0 0 2 3 0  Down, Depressed, Hopeless 0 1 3 3  0  PHQ - 2 Score 0 1 5 6  0  Altered sleeping   3 3 2   Tired, decreased energy   3 3  2   Change in appetite   2 0 0  Feeling bad or failure about yourself    3 3 1   Trouble concentrating   2 0 0  Moving slowly or fidgety/restless   0 0 0  Suicidal thoughts   2 1 0  PHQ-9 Score   20 16 5   Difficult doing work/chores   Very difficult Not difficult at all        Assessment & Plan:    Patient Active Problem  List   Diagnosis Date Noted   Grief 04/22/2023   Anxiety and depression 04/15/2022   Therapeutic drug monitoring 10/17/2021   Healthcare maintenance 11/09/2018   HIV disease (HCC) 09/22/2018   Dilated cardiomyopathy (HCC) 09/21/2018   Acute combined systolic and diastolic heart failure (HCC)    Morbid obesity (HCC)    Volume overload 09/17/2018   Peripheral edema    Orthopnea    Cardiomegaly    Uncomplicated asthma      Problem List Items Addressed This Visit       Other   Morbid obesity (HCC) - Primary   Plan for gastric sleeve during the Summer of 2025. Continue with lifestyle management and preparation in the meantime.       HIV disease [facility])   Mr. Kirchen continues to have well-controlled virus with good adherence and tolerance to [place].  Reviewed previous lab work and discussed plan of care and U equals U.  Check blood work.  Continue current dose of Biktarvy.  Plan for follow-up in 6 months or sooner if needed with lab work on the same day.      Relevant Medications   bictegravir-emtricitabine-tenofovir AF (BIKTARVY) 50-200-25 MG TABS tablet   Other Relevant Orders   COMPLETE METABOLIC PANEL WITH GFR   HIV-1 RNA quant-no reflex-bld   T-helper cell (CD4)- ([facility] clinic only)   Healthcare maintenance   Discussed importance of safe sexual practice and condom use. Condoms and site specific STD testing offered.  Vaccinations reviewed and declined today. Due for routine dental care.  Referral to [facility] offered and he would like to schedule independently.      Grief   Experiencing expected levels of grief with recent loss of a friend and has good  support system. No interventions indicated at this time.         I have changed Calem M. Forton's Biktarvy. I am also having him maintain his fluticasone, CVS Aspirin Adult Low Dose, albuterol, spironolactone, bisoprolol, atorvastatin, isosorbide-hydrALAZINE, Entresto, furosemide, and dapagliflozin propanediol.   Meds ordered this encounter  Medications   bictegravir-emtricitabine-tenofovir AF (BIKTARVY) 50-200-25 MG TABS tablet    Sig: Take 1 tablet by mouth daily.    Dispense:  30 tablet    Refill:  6    Supervising Provider:   Judyann Munson [phone]    Prescription Type::   Renewal     Follow-up: Return in about 6 months (around 10/20/2023). or sooner if needed.    Marcos Eke, MSN, FNP-C Nurse Practitioner [facility] for Infectious Disease [facility] Main number: [phone]

## 2023-04-22 NOTE — Assessment & Plan Note (Signed)
Plan for gastric sleeve during the Summer of 2025. Continue with lifestyle management and preparation in the meantime.

## 2023-04-22 NOTE — Assessment & Plan Note (Signed)
Experiencing expected levels of grief with recent loss of a friend and has good support system. No interventions indicated at this time.

## 2023-04-22 NOTE — Assessment & Plan Note (Signed)
Discussed importance of safe sexual practice and condom use. Condoms and site specific STD testing offered.  Vaccinations reviewed and declined today. Due for routine dental care.  Referral to [facility] offered and he would like to schedule independently.

## 2023-04-23 LAB — T-HELPER CELL (CD4) - (RCID CLINIC ONLY)
CD4 % Helper T Cell: 45 % (ref 33–65)
CD4 T Cell Abs: 1100 /uL (ref 400–1790)

## 2023-04-24 LAB — COMPLETE METABOLIC PANEL WITH GFR
AG Ratio: 1.5 (calc) (ref 1.0–2.5)
ALT: 41 U/L (ref 9–46)
AST: 31 U/L (ref 10–40)
Albumin: 4.1 g/dL (ref 3.6–5.1)
Alkaline phosphatase (APISO): 85 U/L (ref 36–130)
BUN: 16 mg/dL (ref 7–25)
CO2: 28 mmol/L (ref 20–32)
Calcium: 9.3 mg/dL (ref 8.6–10.3)
Chloride: 102 mmol/L (ref 98–110)
Creat: 1.11 mg/dL (ref 0.60–1.26)
Globulin: 2.8 g/dL (ref 1.9–3.7)
Glucose, Bld: 101 mg/dL — ABNORMAL HIGH (ref 65–99)
Potassium: 3.7 mmol/L (ref 3.5–5.3)
Sodium: 139 mmol/L (ref 135–146)
Total Bilirubin: 1 mg/dL (ref 0.2–1.2)
Total Protein: 6.9 g/dL (ref 6.1–8.1)
eGFR: 90 mL/min/{1.73_m2} (ref >=60)

## 2023-04-24 LAB — HIV-1 RNA QUANT-NO REFLEX-BLD
HIV 1 RNA Quant: NOT DETECTED {copies}/mL
HIV-1 RNA Quant, Log: NOT DETECTED {Log}

## 2023-06-18 ENCOUNTER — Other Ambulatory Visit: Payer: Self-pay | Admitting: Pharmacist

## 2023-06-18 ENCOUNTER — Other Ambulatory Visit: Payer: Self-pay

## 2023-06-18 ENCOUNTER — Other Ambulatory Visit (HOSPITAL_COMMUNITY): Payer: Self-pay

## 2023-06-18 DIAGNOSIS — B20 Human immunodeficiency virus [HIV] disease: Secondary | ICD-10-CM

## 2023-06-18 MED ORDER — BIKTARVY 50-200-25 MG PO TABS
1.0000 | ORAL_TABLET | Freq: Every day | ORAL | 6 refills | Status: DC
  Filled 2023-06-18: qty 30, 30d supply, fill #0
  Filled 2023-07-21 (×2): qty 30, 30d supply, fill #1
  Filled 2023-08-07 (×2): qty 30, 30d supply, fill #2
  Filled 2023-09-05: qty 30, 30d supply, fill #3
  Filled 2023-10-01: qty 30, 30d supply, fill #4
  Filled 2023-10-23: qty 30, 30d supply, fill #5

## 2023-06-18 NOTE — Progress Notes (Signed)
 Specialty Pharmacy Ongoing Clinical Assessment Note  Todd Griffith is a 33 y.o. male who is being followed by the specialty pharmacy service for RxSp HIV   Patient's specialty medication(s) reviewed today: Bictegravir-Emtricitab-Tenofov (Biktarvy)   Missed doses in the last 4 weeks: More than 5   Patient/Caregiver did not have any additional questions or concerns.   Therapeutic benefit summary: Patient is achieving benefit   Adverse events/side effects summary: No adverse events/side effects   Patient's therapy is appropriate to: Continue    Goals Addressed             This Visit's Progress    Achieve Undetectable HIV Viral Load < 20       Patient is on track. Patient will maintain adherence      Increase CD4 count until steady state       Patient is on track. Patient will maintain adherence      Maintain optimal adherence to therapy       Patient is on track. Patient will maintain adherence         Follow up:  6 months  Jennette Kettle Specialty Pharmacist

## 2023-06-18 NOTE — Progress Notes (Signed)
 Specialty Pharmacy Initial Fill Coordination Note  Todd Griffith is a 33 y.o. male contacted today regarding initial fill of specialty medication(s) Bictegravir-Emtricitab-Tenofov [employer])   Patient requested Delivery   Delivery date: 06/20/23   Verified address: [address] [place] [address]   Medication will be filled on 06/19/23.   Patient is aware of $0 copayment.

## 2023-06-18 NOTE — Progress Notes (Signed)
 Resending per patient preference.  Margarite Gouge, PharmD, CPP, BCIDP, AAHIVP Clinical Pharmacist Practitioner Infectious Diseases Clinical Pharmacist [facility] for Infectious Disease

## 2023-06-19 ENCOUNTER — Other Ambulatory Visit: Payer: Self-pay | Admitting: Pharmacist

## 2023-06-19 DIAGNOSIS — B20 Human immunodeficiency virus [HIV] disease: Secondary | ICD-10-CM

## 2023-06-19 MED ORDER — BICTEGRAVIR-EMTRICITAB-TENOFOV 50-200-25 MG PO TABS: 1.0000 | ORAL_TABLET | Freq: Every day | ORAL | Status: AC

## 2023-06-19 NOTE — Progress Notes (Signed)
 Medication Samples have been provided to the patient.  Drug name: Biktarvy        Strength: 50/200/25 mg       Qty: 7 tablets (1 bottles) LOT: [id number]   Exp.Date: 06/27  Samples requested by Kae Heller (insurance issues).  Dosing instructions: Take one tablet by mouth once daily  The patient has been instructed regarding the correct time, dose, and frequency of taking this medication, including desired effects and most common side effects.   Margarite Gouge, PharmD, CPP, BCIDP, AAHIVP Clinical Pharmacist Practitioner Infectious Diseases Clinical Pharmacist [facility] for Infectious Disease

## 2023-07-08 ENCOUNTER — Other Ambulatory Visit (HOSPITAL_COMMUNITY): Payer: Self-pay

## 2023-07-11 ENCOUNTER — Other Ambulatory Visit: Payer: Self-pay

## 2023-07-19 ENCOUNTER — Other Ambulatory Visit: Payer: Self-pay | Admitting: Family

## 2023-07-19 DIAGNOSIS — B20 Human immunodeficiency virus [HIV] disease: Secondary | ICD-10-CM

## 2023-07-21 ENCOUNTER — Other Ambulatory Visit: Payer: Self-pay

## 2023-07-21 ENCOUNTER — Other Ambulatory Visit (HOSPITAL_COMMUNITY): Payer: Self-pay

## 2023-07-21 NOTE — Progress Notes (Signed)
 Specialty Pharmacy Refill Coordination Note  Todd Griffith is a 33 y.o. male contacted today regarding refills of specialty medication(s) Bictegravir-Emtricitab-Tenofov (Biktarvy )   Patient requested Delivery   Delivery date: 07/23/23   Verified address: [address] [place] [address]   Medication will be filled on 07/22/23.   Patient responded to mychart questionnaire. Patient was left a voice mail that primary insurance is no longer active and secondary [employer] rejects with submit to there processor. Patient was notified on voice mail to update pharmacy with new insurance or to contact [employer] that patient has no other active insurance coverage and to notify the pharmacy as soon as possible.

## 2023-07-23 ENCOUNTER — Encounter (HOSPITAL_COMMUNITY): Admitting: Cardiology

## 2023-08-07 ENCOUNTER — Other Ambulatory Visit: Payer: Self-pay

## 2023-08-07 NOTE — Progress Notes (Signed)
 Specialty Pharmacy Refill Coordination Note  Todd Griffith is a 33 y.o. male contacted today regarding refills of specialty medication(s) Bictegravir-Emtricitab-Tenofov (Biktarvy )   Patient requested (Patient-Rptd) Delivery   Delivery date: (Patient-Rptd) 08/15/23   Verified address: (Patient-Rptd) [address] [place] [address]   Medication will be filled on 08/14/23.

## 2023-08-11 ENCOUNTER — Encounter (HOSPITAL_COMMUNITY): Payer: Self-pay | Admitting: Cardiology

## 2023-08-11 ENCOUNTER — Ambulatory Visit (HOSPITAL_COMMUNITY)
Admission: RE | Admit: 2023-08-11 | Discharge: 2023-08-11 | Disposition: A | Discharge status: Home / Self Care | Source: Ambulatory Visit | Attending: Cardiology | Admitting: Cardiology

## 2023-08-11 DIAGNOSIS — Z6841 Body Mass Index (BMI) 40.0 and over, adult: Secondary | ICD-10-CM | POA: Diagnosis not present

## 2023-08-11 DIAGNOSIS — Z7982 Long term (current) use of aspirin: Secondary | ICD-10-CM | POA: Diagnosis not present

## 2023-08-11 DIAGNOSIS — I428 Other cardiomyopathies: Secondary | ICD-10-CM | POA: Insufficient documentation

## 2023-08-11 DIAGNOSIS — B20 Human immunodeficiency virus [HIV] disease: Secondary | ICD-10-CM | POA: Diagnosis not present

## 2023-08-11 DIAGNOSIS — I110 Hypertensive heart disease with heart failure: Secondary | ICD-10-CM | POA: Diagnosis not present

## 2023-08-11 DIAGNOSIS — I340 Nonrheumatic mitral (valve) insufficiency: Secondary | ICD-10-CM | POA: Insufficient documentation

## 2023-08-11 DIAGNOSIS — G4733 Obstructive sleep apnea (adult) (pediatric): Secondary | ICD-10-CM | POA: Insufficient documentation

## 2023-08-11 DIAGNOSIS — J45909 Unspecified asthma, uncomplicated: Secondary | ICD-10-CM | POA: Insufficient documentation

## 2023-08-11 DIAGNOSIS — F1010 Alcohol abuse, uncomplicated: Secondary | ICD-10-CM | POA: Insufficient documentation

## 2023-08-11 DIAGNOSIS — I2510 Atherosclerotic heart disease of native coronary artery without angina pectoris: Secondary | ICD-10-CM | POA: Insufficient documentation

## 2023-08-11 DIAGNOSIS — I5022 Chronic systolic (congestive) heart failure: Secondary | ICD-10-CM | POA: Diagnosis not present

## 2023-08-11 DIAGNOSIS — E669 Obesity, unspecified: Secondary | ICD-10-CM | POA: Insufficient documentation

## 2023-08-11 LAB — BRAIN NATRIURETIC PEPTIDE: B Natriuretic Peptide: 6 pg/mL (ref 0.0–100.0)

## 2023-08-11 LAB — BASIC METABOLIC PANEL WITH GFR
Anion gap: 9 (ref 5–15)
BUN: 15 mg/dL (ref 6–20)
CO2: 25 mmol/L (ref 22–32)
Calcium: 9.2 mg/dL (ref 8.9–10.3)
Chloride: 103 mmol/L (ref 98–111)
Creatinine, Ser: 1.18 mg/dL (ref 0.61–1.24)
GFR, Estimated: >60 mL/min (ref >60)
Glucose, Bld: 94 mg/dL (ref 70–99)
Potassium: 3.9 mmol/L (ref 3.5–5.1)
Sodium: 137 mmol/L (ref 135–145)

## 2023-08-11 MED ORDER — ATORVASTATIN CALCIUM 20 MG PO TABS: 20.0000 mg | ORAL_TABLET | Freq: Every day | ORAL | 3 refills | Status: AC

## 2023-08-11 MED ORDER — BISOPROLOL FUMARATE 10 MG PO TABS: 10.0000 mg | ORAL_TABLET | Freq: Every day | ORAL | 3 refills | Status: AC

## 2023-08-11 MED ORDER — SPIRONOLACTONE 25 MG PO TABS: 25.0000 mg | ORAL_TABLET | Freq: Every day | ORAL | 3 refills | Status: AC

## 2023-08-11 MED ORDER — ENTRESTO 97-103 MG PO TABS: 1.0000 | ORAL_TABLET | Freq: Two times a day (BID) | ORAL | 3 refills | Status: AC

## 2023-08-11 MED ORDER — DAPAGLIFLOZIN PROPANEDIOL 10 MG PO TABS: 10.0000 mg | ORAL_TABLET | Freq: Every day | ORAL | 3 refills | Status: AC

## 2023-08-11 MED ORDER — FUROSEMIDE 80 MG PO TABS: 80.0000 mg | ORAL_TABLET | Freq: Every day | ORAL | 2 refills | Status: AC

## 2023-08-11 MED ORDER — ISOSORB DINITRATE-HYDRALAZINE 20-37.5 MG PO TABS: 1.0000 | ORAL_TABLET | Freq: Three times a day (TID) | ORAL | 1 refills | Status: AC

## 2023-08-11 NOTE — Progress Notes (Signed)
 PCP: Jerrlyn Morel, NP Cardiology: Dr. Mitzie Anda  Chief complaint: CHF  33 y.o. with history of asthma, OSA, HIV+, and nonischemic cardiomyopathy returns for followup of CHF.  He was admitted in 6/20 with dyspnea and found to have CHF.  Echo showed EF 15-20% with diffuse hypokinesis, moderate-severe MR. LHC/RHC showed mild coronary disease, preserved cardiac output.  He was also diagnosed with HIV and started on Biktarvy .   Echo in 10/20 showed EF up to 35-40%, MR is only trivial.  Echo in 2/21 showed EF 35-40% with normal RV.  Echo 7/23 showed EF up to 45% with normal RV.   Echo 4/24 showed EF 40-45%, mild LV dilation, global hypokinesis, normal RV.   Today he returns for HF follow up. We have not seen him in a year.  He ran out of Bidil  and Farxiga  but restarted them about 2 wks ago.  He feels stable symptomatically.  He can walk 1.5 miles on the treadmill without dyspnea.  He lifts weights. No lightheadedness or chest pain.  Weight is up 23 lbs since he was last seen.  He uses his CPAP at night.  He is being evaluated for gastric sleeve at [facility].   Labs (9/20): K 4.2, creatinine 1.48 Labs (10/20): LDL 73, HDL 26 Labs (11/20): K 4.1, creatinine 1.39 Labs (12/20): K 4.2, creatinine 1.43 Labs (1/22): K 3.8, creatinine 1.32, LDL 58, HDL 25 Labs (4/22): K 3.8, creatinine 1.4 Labs (7/23): K 4.2, creatinine 1.36, LFTs normal  Labs (9/23): LDL 80 Labs (2/24): K 4.1, creatinine 1.10 Labs (4/24): K 4.1, creatinine 1.22, LDL 38 Labs (1/25): K 3.7, creatinine 1.11  PMH: 1. Chronic systolic CHF: Nonischemic cardiomyopathy.   - LHC/RHC (6/20): mean RA 4, mean PCWP 21, CI 3; 70% D1 stenosis.  - Echo (6/20): EF 15-20%, normal RV, moderate-severe MR.  - Echo (10/20): EF 35-40% with diffuse hypokinesis, normal RV, trivial MR.   - Echo (2/21): EF 35-40%, diffuse hypokinesis, normal RV, normal IVC - Echo (7/22): EF 45%, normal RV.  - Echo (4/24): EF 40-45%, mild LV dilation, global hypokinesis,  normal RV. 2. HIV+: Followed by ID.  3. H/o ETOH abuse.  4. Asthma: Since childhood.  5. HTN 6. Morbid obesity.  7. OSA: Uses CPAP.  8. CAD: LHC in 6/20 with 70% D1 stenosis.   FH: Father, grandf ather with CAD, no cardiomyopathy.   SH: Lives in [place] with girlfriend and child, works for [employer] of [place].  Nonsmoker.  Prior heavy ETOH, now drinks some on weekend but much less. Nonsmoker, no drugs.   ROS: All systems reviewed and negative except as per HPI.   Current Outpatient Medications  Medication Sig Dispense Refill   albuterol  (VENTOLIN  HFA) 108 (90 Base) MCG/ACT inhaler Inhale 1-2 puffs into the lungs every 6 (six) hours as needed for wheezing or shortness of breath. 6.7 g 2   bictegravir-emtricitabine -tenofovir  AF (BIKTARVY ) 50-200-25 MG TABS tablet Take 1 tablet by mouth daily. 30 tablet 6   fluticasone  (FLONASE ) 50 MCG/ACT nasal spray Place 2 sprays into both nostrils daily. 16 g 0   atorvastatin  (LIPITOR) 20 MG tablet Take 1 tablet (20 mg total) by mouth daily. 90 tablet 3   bisoprolol  (ZEBETA ) 10 MG tablet Take 1 tablet (10 mg total) by mouth daily. 90 tablet 3   dapagliflozin  propanediol (FARXIGA ) 10 MG TABS tablet Take 1 tablet (10 mg total) by mouth daily before breakfast. 90 tablet 3   furosemide  (LASIX ) 80 MG tablet Take 1  tablet (80 mg total) by mouth daily. 90 tablet 2   isosorbide-hydrALAZINE  (BIDIL ) 20-37.5 MG tablet Take 1 tablet by mouth 3 (three) times daily. 270 tablet 1   sacubitril -valsartan  (ENTRESTO ) 97-103 MG Take 1 tablet by mouth 2 (two) times daily. 180 tablet 3   spironolactone  (ALDACTONE ) 25 MG tablet Take 1 tablet (25 mg total) by mouth daily. 90 tablet 3   No current facility-administered medications for this encounter.   Wt Readings from Last 3 Encounters:  08/11/23 (!) 202.8 kg (447 lb)  04/22/23 (!) 206.1 kg (454 lb 6.4 oz)  09/24/22 (!) 192.7 kg (424 lb 12.8 oz)   BP 110/78   Pulse 97   Wt (!) 202.8 kg (447 lb)   SpO2 95%   BMI 64.14  kg/m  General: NAD, obese.  Neck: Thick. No JVD, no thyromegaly or thyroid nodule.  Lungs: Clear to auscultation bilaterally with normal respiratory effort. CV: Nondisplaced PMI.  Heart regular S1/S2, no S3/S4, no murmur.  No peripheral edema.  No carotid bruit.  Normal pedal pulses.  Abdomen: Soft, nontender, no hepatosplenomegaly, no distention.  Skin: Intact without lesions or rashes.  Neurologic: Alert and oriented x 3.  Psych: Normal affect. Extremities: No clubbing or cyanosis.  HEENT: Normal.   Assessment/Plan: 1. Chronic systolic CHF: Nonischemic cardiomyopathy.  Echo in 6/20 showed EF 15-20% with moderate-severe MR.  RHC/LHC indicated nonischemic cardiomyopathy, cardiac output preserved. Cause of cardiomyopathy is uncertain, he was a heavy drinker in the past.  He has also been found to have HIV, which can cause a cardiomyopathy, but he has been on meds for this and it is controlled.  Echo in 10/20 showed EF improved up to 35-40%.  Echo in 2/21 showed EF 35-40% still.  Echo 7/22 showed EF up to 45%.  Echo (4/24) showed EF 40-45%. Exam is difficult for volume but he does not appear to have significant volume overload.  NYHA class II symptoms.  - Continue bisoprolol  10 mg daily.  - Continue Entresto  97/103 mg bid.  - Continue spironolactone  25 mg daily. BMET/BNP today. - Continue Bidil  1 tab tid.  - Continue dapagliflozin  10 mg daily.  - Continue Lasix  80 mg daily.     - EF has been outside of ICD range.      - I do not think that he would fit in scanner for cardiac MRI.  - I will arrange for repeat echo.  2. CAD: LHC (6/20) showed 70% D1 stenosis.   - Continue ASA 81 daily.  - Continue atorvastatin  20 mg daily. Check lipids next appt.  3. Mitral regurgitation: Much improved on most recent echoes.  4. ETOH abuse: Prior ETOH abuse may have contributed to his cardiomyopathy. He has cut back considerably.  5. OSA: Encouraged better compliance CPAP.  6. Obesity: Body mass index is  64.14 kg/m.  - I think he would be an excellent GLP-1 agonist candidate, will try again this year to see if his insurance will cover one.  - Being evaluated at [facility] for gastric sleeve.  7. HTN: BP controlled.   Follow up in 3-4 months with APP.   I spent 32 minutes reviewing data, interviewing patient, and organizing the orders/followup.   Todd Griffith  08/11/2023

## 2023-08-11 NOTE — Patient Instructions (Addendum)
 Medication Changes:  No Changes In Medications at this time.   MEDICATIONS REFILLED TO PHARMACY ON FILE   Lab Work:  Labs done today, your results will be available in MyChart, we will contact you for abnormal readings.  Testing/Procedures:  ECHOCARDIOGRAM AS SCHEDULED   Referrals:  YOU HAVE BEEN REFERRED TO PHARMD OVER AT [address]  FOR GLP-1 THEY WILL REACH OUT TO YOU OR CALL TO ARRANGE THIS. PLEASE CALL US  WITH ANY CONCERNS   Follow-Up in: 3 MONTHS WITH APP AS SCHEDULED   At the [facility], you and your health needs are our priority. We have a designated team specialized in the treatment of Heart Failure. This Care Team includes your primary Heart Failure Specialized Cardiologist (physician), Advanced Practice Providers (APPs- Physician Assistants and Nurse Practitioners), and Pharmacist who all work together to provide you with the care you need, when you need it.   You may see any of the following providers on your designated Care Team at your next follow up:  Dr. Jules Oar Dr. Peder Bourdon Dr. Alwin Baars Dr. Judyth Nunnery Nieves Bars, NP Ruddy Corral, [place] [facility] [place], [place] Dennise Fitz, NP [place] Lee, NP Luster Salters, PharmD   Please be sure to bring in all your medications bottles to every appointment.   Need to Contact Us :  If you have any questions or concerns before your next appointment please send us  a message through [place] or call our office at [phone].    TO LEAVE A MESSAGE FOR THE NURSE SELECT OPTION 2, PLEASE LEAVE A MESSAGE INCLUDING: YOUR NAME DATE OF BIRTH CALL BACK NUMBER REASON FOR CALL**this is important as we prioritize the call backs  YOU WILL RECEIVE A CALL BACK THE SAME DAY AS LONG AS YOU CALL BEFORE 4:00 PM

## 2023-08-26 ENCOUNTER — Ambulatory Visit: Admitting: Pharmacist

## 2023-08-26 NOTE — Progress Notes (Deleted)
 Patient ID: Todd Griffith                 DOB: May 10, 1990                    MRN: [id number]     HPI: Todd Griffith is a 33 y.o. male patient referred to pharmacy clinic by Dr. Mitzie Anda to initiate GLP1-RA therapy. PMH is significant for asthma, Severe OSA, HIV+, and nonischemic cardiomyopathy, CAD, HTN and obesity. Most recent BMI ***.  Sleep study in 2020 showed severe OSA with an AHI of 32.9.  Zepbound- OSA  Baseline weight and BMI: *** Current weight and BMI: *** Current meds that affect weight: ****  *** If diabetic and on insulin/sulfonylurea, can consider reducing dose to reduce risk of hypoglycemia  *** Follow-up visit  Assess % weight loss Assess adverse effects Missed doses  Diet:   Exercise:   Family History:   Social History:   Labs: Lab Results  Component Value Date   HGBA1C 5.2 11/10/2020    Wt Readings from Last 1 Encounters:  08/11/23 (!) 447 lb (202.8 kg)    BP Readings from Last 1 Encounters:  08/11/23 110/78   Pulse Readings from Last 1 Encounters:  08/11/23 97       Component Value Date/Time   CHOL 101 07/29/2022 1427   CHOL 111 11/10/2020 1200   TRIG 181 (H) 07/29/2022 1427   HDL 27 (L) 07/29/2022 1427   HDL 27 (L) 11/10/2020 1200   CHOLHDL 3.7 07/29/2022 1427   VLDL 36 07/29/2022 1427   LDLCALC 38 07/29/2022 1427   LDLCALC 50 11/10/2020 1200   LDLDIRECT 62 11/10/2020 1200    Past Medical History:  Diagnosis Date   Asthma    Bronchitis    CHF (congestive heart failure) (HCC)    HIV (human immunodeficiency virus infection) (HCC)    Obstructive sleep apnea     Current Outpatient Medications on File Prior to Visit  Medication Sig Dispense Refill   albuterol  (VENTOLIN  HFA) 108 (90 Base) MCG/ACT inhaler Inhale 1-2 puffs into the lungs every 6 (six) hours as needed for wheezing or shortness of breath. 6.7 g 2   atorvastatin  (LIPITOR) 20 MG tablet Take 1 tablet (20 mg total) by mouth daily. 90 tablet 3    bictegravir-emtricitabine -tenofovir  AF (BIKTARVY ) 50-200-25 MG TABS tablet Take 1 tablet by mouth daily. 30 tablet 6   bisoprolol  (ZEBETA ) 10 MG tablet Take 1 tablet (10 mg total) by mouth daily. 90 tablet 3   dapagliflozin  propanediol (FARXIGA ) 10 MG TABS tablet Take 1 tablet (10 mg total) by mouth daily before breakfast. 90 tablet 3   fluticasone  (FLONASE ) 50 MCG/ACT nasal spray Place 2 sprays into both nostrils daily. 16 g 0   furosemide  (LASIX ) 80 MG tablet Take 1 tablet (80 mg total) by mouth daily. 90 tablet 2   isosorbide-hydrALAZINE  (BIDIL ) 20-37.5 MG tablet Take 1 tablet by mouth 3 (three) times daily. 270 tablet 1   sacubitril -valsartan  (ENTRESTO ) 97-103 MG Take 1 tablet by mouth 2 (two) times daily. 180 tablet 3   spironolactone  (ALDACTONE ) 25 MG tablet Take 1 tablet (25 mg total) by mouth daily. 90 tablet 3   No current facility-administered medications on file prior to visit.    No Known Allergies   Assessment/Plan:  1. Weight loss - Patient has not met goal of at least 5% of body weight loss with comprehensive lifestyle modifications alone in the past 3-6 months. Pharmacotherapy is appropriate  to pursue as augmentation. Will start ***. Confirmed patient not ***pregnant and no personal or family history of medullary thyroid carcinoma (MTC) or Multiple Endocrine Neoplasia syndrome type 2 (MEN 2). Injection technique reviewed at today's visit.  Advised patient on common side effects including nausea, diarrhea, dyspepsia, decreased appetite, and fatigue. Counseled patient on reducing meal size and how to titrate medication to minimize side effects. Counseled patient to call if intolerable side effects or if experiencing dehydration, abdominal pain, or dizziness. Patient will adhere to dietary modifications and will target at least 150 minutes of moderate intensity exercise weekly.   Follow up in 1 month via telephone for tolerability update and dose titration.

## 2023-08-28 ENCOUNTER — Ambulatory Visit (INDEPENDENT_AMBULATORY_CARE_PROVIDER_SITE_OTHER): Admitting: Nurse Practitioner

## 2023-08-28 ENCOUNTER — Encounter: Payer: Self-pay | Admitting: Nurse Practitioner

## 2023-08-28 VITALS — BP 119/73 | HR 78 | Temp 98.4°F | Wt >= 6400 oz

## 2023-08-28 DIAGNOSIS — R350 Frequency of micturition: Secondary | ICD-10-CM

## 2023-08-28 DIAGNOSIS — Z125 Encounter for screening for malignant neoplasm of prostate: Secondary | ICD-10-CM

## 2023-08-28 DIAGNOSIS — Z0000 Encounter for general adult medical examination without abnormal findings: Secondary | ICD-10-CM

## 2023-08-28 DIAGNOSIS — Z113 Encounter for screening for infections with a predominantly sexual mode of transmission: Secondary | ICD-10-CM

## 2023-08-28 DIAGNOSIS — Z13220 Encounter for screening for lipoid disorders: Secondary | ICD-10-CM

## 2023-08-28 DIAGNOSIS — N390 Urinary tract infection, site not specified: Secondary | ICD-10-CM

## 2023-08-28 LAB — POCT GLYCOSYLATED HEMOGLOBIN (HGB A1C): Hemoglobin A1C: 4.9 % (ref 4.0–5.6)

## 2023-08-28 LAB — POCT URINALYSIS DIP (CLINITEK)
Bilirubin, UA: NEGATIVE
Blood, UA: NEGATIVE
Glucose, UA: >=1000 mg/dL — AB
Ketones, POC UA: NEGATIVE mg/dL
Nitrite, UA: NEGATIVE
POC PROTEIN,UA: 30 — AB
Spec Grav, UA: 1.02 (ref 1.010–1.025)
Urobilinogen, UA: 0.2 U/dL
pH, UA: 6 (ref 5.0–8.0)

## 2023-08-28 MED ORDER — SULFAMETHOXAZOLE-TRIMETHOPRIM 800-160 MG PO TABS: 1.0000 | ORAL_TABLET | Freq: Two times a day (BID) | ORAL | 0 refills | Status: AC

## 2023-08-28 NOTE — Patient Instructions (Signed)
 1. Urine frequency (Primary)  - POCT URINALYSIS DIP (CLINITEK)  2. Screen for STD (sexually transmitted disease)  - Chlamydia/Gonococcus/Trichomonas, NAA  3. Routine adult health maintenance  - CBC - Comprehensive metabolic panel with GFR  4. Lipid screening  - Lipid Panel  5. Prostate cancer screening  - PSA

## 2023-08-28 NOTE — Progress Notes (Signed)
 Subjective   Patient ID: Todd Griffith, male    DOB: November 24, 1990, 33 y.o.   MRN: [id number]  Chief Complaint  Patient presents with   Urinary Tract Infection    Referring provider: Jerrlyn Morel, NP  Todd Griffith is a 33 y.o. male with Past Medical History: No date: Anxiety No date: Asthma No date: Bronchitis No date: CHF (congestive heart failure) (HCC) No date: HIV (human immunodeficiency virus infection) (HCC) No date: Obstructive sleep apnea  HPI: Urinary Tract Infection  This is a new problem. The current episode started 1 to 4 weeks ago. The problem has been gradually worsening. The quality of the pain is described as burning. The pain is moderate. There has been no fever. He is Sexually active. There is No history of pyelonephritis. Associated symptoms include frequency, hematuria, hesitancy and urgency. He has tried nothing for the symptoms.   Patient is also requesting a physical with blood work today.  Overall doing well other than urinary symptoms.  May need referral to urology.  Will check labs today. Denies f/c/s, n/v/d, hemoptysis, PND, leg swelling Denies chest pain or edema   A1C: 4.9  UA showed trace bacteria. Will order bactrim   No Known Allergies  Immunization History  Administered Date(s) Administered   Influenza,inj,Quad PF,6+ Mos 02/27/2021, 05/22/2022   PPD Test 11/08/2011   Pneumococcal Conjugate-13 11/09/2018   Pneumococcal Polysaccharide-23 02/23/2019   Tdap 10/07/2018    Tobacco History: Social History   Tobacco Use  Smoking Status Former   Current packs/day: 0.00   Types: Cigarettes   Quit date: 05/31/2018   Years since quitting: 5.2  Smokeless Tobacco Never   Counseling given: Not Answered   Outpatient Encounter Medications as of 08/28/2023  Medication Sig   albuterol  (VENTOLIN  HFA) 108 (90 Base) MCG/ACT inhaler Inhale 1-2 puffs into the lungs every 6 (six) hours as needed for wheezing or shortness of  breath.   atorvastatin  (LIPITOR) 20 MG tablet Take 1 tablet (20 mg total) by mouth daily.   bictegravir-emtricitabine -tenofovir  AF (BIKTARVY ) 50-200-25 MG TABS tablet Take 1 tablet by mouth daily.   bisoprolol  (ZEBETA ) 10 MG tablet Take 1 tablet (10 mg total) by mouth daily.   dapagliflozin  propanediol (FARXIGA ) 10 MG TABS tablet Take 1 tablet (10 mg total) by mouth daily before breakfast.   fluticasone  (FLONASE ) 50 MCG/ACT nasal spray Place 2 sprays into both nostrils daily.   furosemide  (LASIX ) 80 MG tablet Take 1 tablet (80 mg total) by mouth daily.   isosorbide-hydrALAZINE  (BIDIL ) 20-37.5 MG tablet Take 1 tablet by mouth 3 (three) times daily.   sacubitril -valsartan  (ENTRESTO ) 97-103 MG Take 1 tablet by mouth 2 (two) times daily.   spironolactone  (ALDACTONE ) 25 MG tablet Take 1 tablet (25 mg total) by mouth daily.   No facility-administered encounter medications on file as of 08/28/2023.    Review of Systems  Review of Systems  Constitutional: Negative.   HENT: Negative.    Cardiovascular: Negative.   Gastrointestinal: Negative.   Genitourinary:  Positive for frequency, hematuria, hesitancy and urgency.  Allergic/Immunologic: Negative.   Neurological: Negative.   Psychiatric/Behavioral: Negative.       Objective:   BP 119/73   Pulse 78   Temp 98.4 F (36.9 C) (Oral)   Wt (!) 438 lb (198.7 kg)   SpO2 99%   BMI 62.85 kg/m   Wt Readings from Last 5 Encounters:  08/28/23 (!) 438 lb (198.7 kg)  08/11/23 (!) 447 lb (202.8 kg)  04/22/23 (!) 454  lb 6.4 oz (206.1 kg)  09/24/22 (!) 424 lb 12.8 oz (192.7 kg)  07/29/22 (!) 427 lb 9.6 oz (194 kg)     Physical Exam Vitals and nursing note reviewed.  Constitutional:      General: He is not in acute distress.    Appearance: He is well-developed.  Cardiovascular:     Rate and Rhythm: Normal rate and regular rhythm.  Pulmonary:     Effort: Pulmonary effort is normal.     Breath sounds: Normal breath sounds.  Skin:     General: Skin is warm and dry.  Neurological:     Mental Status: He is alert and oriented to person, place, and time.       Assessment & Plan:   Urine frequency -     POCT URINALYSIS DIP (CLINITEK)  Screen for STD (sexually transmitted disease) -     Chlamydia/Gonococcus/Trichomonas, NAA  Routine adult health maintenance -     CBC -     Comprehensive metabolic panel with GFR  Lipid screening -     Lipid panel  Prostate cancer screening -     PSA     Return if symptoms worsen or fail to improve.   Jerrlyn Morel, NP 08/28/2023

## 2023-08-29 LAB — CBC
Hematocrit: 38.6 % (ref 37.5–51.0)
Hemoglobin: 13 g/dL (ref 13.0–17.7)
MCH: 33.5 pg — ABNORMAL HIGH (ref 26.6–33.0)
MCHC: 33.7 g/dL (ref 31.5–35.7)
MCV: 100 fL — ABNORMAL HIGH (ref 79–97)
Platelets: 262 10*3/uL (ref 150–450)
RBC: 3.88 x10E6/uL — ABNORMAL LOW (ref 4.14–5.80)
RDW: 12.8 % (ref 11.6–15.4)
WBC: 7.5 10*3/uL (ref 3.4–10.8)

## 2023-08-29 LAB — COMPREHENSIVE METABOLIC PANEL WITH GFR
ALT: 46 IU/L — ABNORMAL HIGH (ref 0–44)
AST: 31 IU/L (ref 0–40)
Albumin: 3.9 g/dL — ABNORMAL LOW (ref 4.1–5.1)
Alkaline Phosphatase: 107 IU/L (ref 44–121)
BUN/Creatinine Ratio: 11 (ref 9–20)
BUN: 13 mg/dL (ref 6–20)
Bilirubin Total: 1.1 mg/dL (ref 0.0–1.2)
CO2: 20 mmol/L (ref 20–29)
Calcium: 9.2 mg/dL (ref 8.7–10.2)
Chloride: 105 mmol/L (ref 96–106)
Creatinine, Ser: 1.15 mg/dL (ref 0.76–1.27)
Globulin, Total: 2.7 g/dL (ref 1.5–4.5)
Glucose: 86 mg/dL (ref 70–99)
Potassium: 4.3 mmol/L (ref 3.5–5.2)
Sodium: 139 mmol/L (ref 134–144)
Total Protein: 6.6 g/dL (ref 6.0–8.5)
eGFR: 87 mL/min/{1.73_m2} (ref >59)

## 2023-08-29 LAB — LIPID PANEL
Chol/HDL Ratio: 4.1 ratio (ref 0.0–5.0)
Cholesterol, Total: 106 mg/dL (ref 100–199)
HDL: 26 mg/dL — ABNORMAL LOW (ref >39)
LDL Chol Calc (NIH): 47 mg/dL (ref 0–99)
Triglycerides: 205 mg/dL — ABNORMAL HIGH (ref 0–149)
VLDL Cholesterol Cal: 33 mg/dL (ref 5–40)

## 2023-08-29 LAB — PSA: Prostate Specific Ag, Serum: 0.4 ng/mL (ref 0.0–4.0)

## 2023-08-30 LAB — URINE CULTURE

## 2023-08-30 LAB — CHLAMYDIA/GONOCOCCUS/TRICHOMONAS, NAA
Chlamydia by NAA: NEGATIVE
Gonococcus by NAA: NEGATIVE
Trich vag by NAA: NEGATIVE

## 2023-09-01 ENCOUNTER — Ambulatory Visit: Payer: Self-pay | Admitting: Nurse Practitioner

## 2023-09-05 ENCOUNTER — Other Ambulatory Visit: Payer: Self-pay

## 2023-09-05 ENCOUNTER — Encounter (INDEPENDENT_AMBULATORY_CARE_PROVIDER_SITE_OTHER): Payer: Self-pay

## 2023-09-05 NOTE — Progress Notes (Signed)
 Specialty Pharmacy Refill Coordination Note  Todd Griffith is a 33 y.o. male contacted today regarding refills of specialty medication(s) Bictegravir-Emtricitab-Tenofov (Biktarvy )   Patient requested (Patient-Rptd) Delivery   Delivery date: 09/09/23   Verified address: (Patient-Rptd) [address] [place] [address]   Medication will be filled on 09/08/23.

## 2023-09-08 ENCOUNTER — Other Ambulatory Visit: Payer: Self-pay

## 2023-09-24 ENCOUNTER — Ambulatory Visit (INDEPENDENT_AMBULATORY_CARE_PROVIDER_SITE_OTHER): Admitting: Nurse Practitioner

## 2023-09-24 ENCOUNTER — Ambulatory Visit (HOSPITAL_BASED_OUTPATIENT_CLINIC_OR_DEPARTMENT_OTHER)

## 2023-09-24 ENCOUNTER — Ambulatory Visit (HOSPITAL_BASED_OUTPATIENT_CLINIC_OR_DEPARTMENT_OTHER)
Admission: RE | Admit: 2023-09-24 | Discharge: 2023-09-24 | Disposition: A | Discharge status: Home / Self Care | Source: Ambulatory Visit | Attending: Nurse Practitioner | Admitting: Nurse Practitioner

## 2023-09-24 ENCOUNTER — Encounter: Payer: Self-pay | Admitting: Nurse Practitioner

## 2023-09-24 VITALS — BP 123/70 | HR 83 | Temp 98.2°F | Wt >= 6400 oz

## 2023-09-24 DIAGNOSIS — N390 Urinary tract infection, site not specified: Secondary | ICD-10-CM

## 2023-09-24 DIAGNOSIS — N50812 Left testicular pain: Secondary | ICD-10-CM

## 2023-09-24 DIAGNOSIS — N50811 Right testicular pain: Secondary | ICD-10-CM | POA: Insufficient documentation

## 2023-09-24 LAB — POCT URINALYSIS DIP (CLINITEK)
Bilirubin, UA: NEGATIVE
Blood, UA: NEGATIVE
Glucose, UA: 500 mg/dL — AB
Ketones, POC UA: NEGATIVE mg/dL
Nitrite, UA: NEGATIVE
POC PROTEIN,UA: 30 — AB
Spec Grav, UA: 1.02 (ref 1.010–1.025)
Urobilinogen, UA: 0.2 U/dL
pH, UA: 6 (ref 5.0–8.0)

## 2023-09-24 MED ORDER — CEPHALEXIN 500 MG PO CAPS: 500.0000 mg | ORAL_CAPSULE | Freq: Three times a day (TID) | ORAL | 0 refills | Status: AC

## 2023-09-24 NOTE — Progress Notes (Signed)
 Subjective   Patient ID: Todd Griffith, male    DOB: [birth year], 33 y.o.   MRN: [id number]  Chief Complaint  Patient presents with   genital problems    Patient stated that he still feels discomfort in his genital area, patient would like a cat scan     Referring provider: Oley Bascom RAMAN, NP  Todd Griffith is a 33 y.o. male with Past Medical History: No date: Anxiety No date: Asthma No date: Bronchitis No date: CHF (congestive heart failure) (HCC) No date: HIV (human immunodeficiency virus infection) (HCC) No date: Obstructive sleep apnea   HPI  Patient presents today with continued urinary frequency and states that he is having testicular pain.  He was recently treated for UTI with Bactrim .  UA in office today did show trace leukocytes.  Also positive for proteinuria.  Will trial Keflex and place referral to urology.  Will order ultrasound of scrotum. Denies f/c/s, n/v/d, hemoptysis, PND, leg swelling Denies chest pain or edema     No Known Allergies  Immunization History  Administered Date(s) Administered   Influenza,inj,Quad PF,6+ Mos 02/27/2021, 05/22/2022   PPD Test 11/08/2011   Pneumococcal Conjugate-13 11/09/2018   Pneumococcal Polysaccharide-23 02/23/2019   Tdap 10/07/2018    Tobacco History: Social History   Tobacco Use  Smoking Status Former   Current packs/day: 0.00   Types: Cigarettes   Quit date: 05/31/2018   Years since quitting: 5.3  Smokeless Tobacco Never   Counseling given: Not Answered   Outpatient Encounter Medications as of 09/24/2023  Medication Sig   albuterol  (VENTOLIN  HFA) 108 (90 Base) MCG/ACT inhaler Inhale 1-2 puffs into the lungs every 6 (six) hours as needed for wheezing or shortness of breath.   atorvastatin  (LIPITOR) 20 MG tablet Take 1 tablet (20 mg total) by mouth daily.   bictegravir-emtricitabine -tenofovir  AF (BIKTARVY ) 50-200-25 MG TABS tablet Take 1 tablet by mouth daily.   bisoprolol  (ZEBETA ) 10 MG  tablet Take 1 tablet (10 mg total) by mouth daily.   cephALEXin (KEFLEX) 500 MG capsule Take 1 capsule (500 mg total) by mouth 3 (three) times daily for 7 days.   dapagliflozin  propanediol (FARXIGA ) 10 MG TABS tablet Take 1 tablet (10 mg total) by mouth daily before breakfast.   fluticasone  (FLONASE ) 50 MCG/ACT nasal spray Place 2 sprays into both nostrils daily.   furosemide  (LASIX ) 80 MG tablet Take 1 tablet (80 mg total) by mouth daily.   isosorbide-hydrALAZINE  (BIDIL ) 20-37.5 MG tablet Take 1 tablet by mouth 3 (three) times daily.   sacubitril -valsartan  (ENTRESTO ) 97-103 MG Take 1 tablet by mouth 2 (two) times daily.   spironolactone  (ALDACTONE ) 25 MG tablet Take 1 tablet (25 mg total) by mouth daily.   No facility-administered encounter medications on file as of 09/24/2023.    Review of Systems  Review of Systems  Constitutional: Negative.   HENT: Negative.    Cardiovascular: Negative.   Gastrointestinal: Negative.   Genitourinary:  Positive for testicular pain and urgency.  Allergic/Immunologic: Negative.   Neurological: Negative.   Psychiatric/Behavioral: Negative.       Objective:   BP 123/70   Pulse 83   Temp 98.2 F (36.8 C) (Oral)   Wt (!) 431 lb 12.8 oz (195.9 kg)   SpO2 97%   BMI 61.96 kg/m   Wt Readings from Last 5 Encounters:  09/24/23 (!) 431 lb 12.8 oz (195.9 kg)  08/28/23 (!) 438 lb (198.7 kg)  08/11/23 (!) 447 lb (202.8 kg)  04/22/23 (!) 454  lb 6.4 oz (206.1 kg)  09/24/22 (!) 424 lb 12.8 oz (192.7 kg)     Physical Exam Vitals and nursing note reviewed.  Constitutional:      General: He is not in acute distress.    Appearance: He is well-developed.   Cardiovascular:     Rate and Rhythm: Normal rate and regular rhythm.  Pulmonary:     Effort: Pulmonary effort is normal.     Breath sounds: Normal breath sounds.   Skin:    General: Skin is warm and dry.   Neurological:     Mental Status: He is alert and oriented to person, place, and time.        Assessment & Plan:   Pain in both testicles -     Ambulatory referral to Urology -     US  SCROTUM W/DOPPLER -     POCT URINALYSIS DIP (CLINITEK)  Urinary tract infection without hematuria, site unspecified -     Urine Culture -     Cephalexin; Take 1 capsule (500 mg total) by mouth 3 (three) times daily for 7 days.  Dispense: 21 capsule; Refill: 0     Return if symptoms worsen or fail to improve.   Bascom GORMAN Borer, NP 09/24/2023

## 2023-09-24 NOTE — Patient Instructions (Signed)
 1. Pain in both testicles (Primary)  - Ambulatory referral to Urology - US  SCROTUM W/DOPPLER - POCT URINALYSIS DIP (CLINITEK)  2. Urinary tract infection without hematuria, site unspecified  - Urine Culture - cephALEXin (KEFLEX) 500 MG capsule; Take 1 capsule (500 mg total) by mouth 3 (three) times daily for 7 days.  Dispense: 21 capsule; Refill: 0

## 2023-09-25 ENCOUNTER — Ambulatory Visit: Payer: Self-pay | Admitting: Nurse Practitioner

## 2023-09-26 LAB — URINE CULTURE

## 2023-09-29 ENCOUNTER — Ambulatory Visit (HOSPITAL_COMMUNITY): Admission: RE | Admit: 2023-09-29 | Source: Ambulatory Visit

## 2023-10-01 ENCOUNTER — Other Ambulatory Visit (HOSPITAL_COMMUNITY): Payer: Self-pay

## 2023-10-01 ENCOUNTER — Other Ambulatory Visit: Payer: Self-pay

## 2023-10-01 NOTE — Progress Notes (Signed)
 Specialty Pharmacy Refill Coordination Note  Rakeem Colley Polkowski is a 33 y.o. male contacted today regarding refills of specialty medication(s) Bictegravir-Emtricitab-Tenofov (Biktarvy )   Patient requested Delivery   Delivery date: 10/06/23   Verified address: [address] [place] [address]   Medication will be filled on 10/02/23.

## 2023-10-15 ENCOUNTER — Ambulatory Visit: Attending: Pharmacist | Admitting: Pharmacist

## 2023-10-15 NOTE — Progress Notes (Deleted)
 Patient ID: Todd Griffith                 DOB: 08-Feb-1991                    MRN: [id number]     HPI: Todd Griffith is a 33 y.o. male patient referred to pharmacy clinic by Dr. Rolan to initiate GLP1-RA therapy. PMH is significant for asthma, OSA, HIV+, and nonischemic cardiomyopathy and obesity. Most recent BMI *** kg/m .  AHI 32.9  Baseline weight and BMI: *** kg/m  Current weight and BMI: *** kg/m  Current meds that affect weight: ****  *** If diabetic and on insulin/sulfonylurea, can consider reducing dose to reduce risk of hypoglycemia  *** Follow-up visit  Assess % weight loss Assess adverse effects Missed doses  Diet:   Exercise:   Family History:   Social History:   Labs: Lab Results  Component Value Date   HGBA1C 4.9 08/28/2023    Wt Readings from Last 1 Encounters:  09/24/23 (!) 431 lb 12.8 oz (195.9 kg)    BP Readings from Last 1 Encounters:  09/24/23 123/70   Pulse Readings from Last 1 Encounters:  09/24/23 83       Component Value Date/Time   CHOL 106 08/28/2023 1040   TRIG 205 (H) 08/28/2023 1040   HDL 26 (L) 08/28/2023 1040   CHOLHDL 4.1 08/28/2023 1040   CHOLHDL 3.7 07/29/2022 1427   VLDL 36 07/29/2022 1427   LDLCALC 47 08/28/2023 1040   LDLDIRECT 62 11/10/2020 1200    Past Medical History:  Diagnosis Date   Anxiety    Asthma    Bronchitis    CHF (congestive heart failure) (HCC)    HIV (human immunodeficiency virus infection) (HCC)    Obstructive sleep apnea     Current Outpatient Medications on File Prior to Visit  Medication Sig Dispense Refill   albuterol  (VENTOLIN  HFA) 108 (90 Base) MCG/ACT inhaler Inhale 1-2 puffs into the lungs every 6 (six) hours as needed for wheezing or shortness of breath. 6.7 g 2   atorvastatin  (LIPITOR) 20 MG tablet Take 1 tablet (20 mg total) by mouth daily. 90 tablet 3   bictegravir-emtricitabine -tenofovir  AF (BIKTARVY ) 50-200-25 MG TABS tablet Take 1 tablet by mouth daily. 30  tablet 6   bisoprolol  (ZEBETA ) 10 MG tablet Take 1 tablet (10 mg total) by mouth daily. 90 tablet 3   dapagliflozin  propanediol (FARXIGA ) 10 MG TABS tablet Take 1 tablet (10 mg total) by mouth daily before breakfast. 90 tablet 3   fluticasone  (FLONASE ) 50 MCG/ACT nasal spray Place 2 sprays into both nostrils daily. 16 g 0   furosemide  (LASIX ) 80 MG tablet Take 1 tablet (80 mg total) by mouth daily. 90 tablet 2   isosorbide-hydrALAZINE  (BIDIL ) 20-37.5 MG tablet Take 1 tablet by mouth 3 (three) times daily. 270 tablet 1   sacubitril -valsartan  (ENTRESTO ) 97-103 MG Take 1 tablet by mouth 2 (two) times daily. 180 tablet 3   spironolactone  (ALDACTONE ) 25 MG tablet Take 1 tablet (25 mg total) by mouth daily. 90 tablet 3   No current facility-administered medications on file prior to visit.    No Known Allergies   Assessment/Plan:  1. Weight loss - Patient has not met goal of at least 5% of body weight loss with comprehensive lifestyle modifications alone in the past 3-6 months. Pharmacotherapy is appropriate to pursue as augmentation. Will start*** . Confirmed patient not ***pregnant and no personal or family history  of medullary thyroid carcinoma (MTC) or Multiple Endocrine Neoplasia syndrome type 2 (MEN 2). Injection technique reviewed at today's visit.  Advised patient on common side effects including nausea, diarrhea, dyspepsia, decreased appetite, and fatigue. Counseled patient on reducing meal size and how to titrate medication to minimize side effects. Counseled patient to call if intolerable side effects or if experiencing dehydration, abdominal pain, or dizziness. Along with pharmacotherapy, the patient will follow dietary modifications and aim for at least 150 minutes of moderate-intensity exercise per week, plus resistance training twice a week (as recommended by the American Heart Association). This resistance training--such as weightlifting, bodyweight exercises, or using resistance bands,  adapted to the patient's ability--will help prevent muscle loss.  Follow up in 1-2 days regarding coverage of *** . If therapy is initiated, phone follow-ups will be conducted every 4 weeks for dose titration until the patient reaches the effective therapeutic dose and target weight.  Lezly Rumpf D Uzziel Russey, Pharm.[name], CPP [facility] [address] [place], [place] [address]  Phone: [phone]; Fax: [phone]

## 2023-10-23 ENCOUNTER — Other Ambulatory Visit: Payer: Self-pay

## 2023-10-23 ENCOUNTER — Encounter (INDEPENDENT_AMBULATORY_CARE_PROVIDER_SITE_OTHER): Payer: Self-pay

## 2023-10-23 NOTE — Progress Notes (Signed)
 Specialty Pharmacy Refill Coordination Note  Todd Griffith is a 33 y.o. male contacted today regarding refills of specialty medication(s) Bictegravir-Emtricitab-Tenofov (Biktarvy )   Patient requested (Patient-Rptd) Delivery   Delivery date: 10/28/23   Verified address: (Patient-Rptd) [address] [place] [address]   Medication will be filled on 07.28.25 when insurance will pay.

## 2023-10-27 ENCOUNTER — Other Ambulatory Visit: Payer: Self-pay

## 2023-11-03 ENCOUNTER — Ambulatory Visit: Payer: 59 | Admitting: Family

## 2023-11-03 ENCOUNTER — Ambulatory Visit: Payer: Self-pay

## 2023-11-05 ENCOUNTER — Ambulatory Visit (HOSPITAL_COMMUNITY)
Admission: RE | Admit: 2023-11-05 | Discharge: 2023-11-05 | Disposition: A | Discharge status: Home / Self Care | Source: Ambulatory Visit | Attending: Cardiology | Admitting: Cardiology

## 2023-11-05 ENCOUNTER — Ambulatory Visit (HOSPITAL_COMMUNITY): Payer: Self-pay | Admitting: Cardiology

## 2023-11-05 DIAGNOSIS — I5022 Chronic systolic (congestive) heart failure: Secondary | ICD-10-CM | POA: Insufficient documentation

## 2023-11-05 LAB — ECHOCARDIOGRAM COMPLETE
Area-P 1/2: 4.8 cm2
Calc EF: 57.8 %
S' Lateral: 3.8 cm
Single Plane A2C EF: 57.9 %
Single Plane A4C EF: 59.6 %

## 2023-11-05 MED ORDER — PERFLUTREN LIPID MICROSPHERE
1.0000 mL | INTRAVENOUS | Status: AC | PRN
  Administered 2023-11-05: 1 mL via INTRAVENOUS

## 2023-11-07 NOTE — Progress Notes (Signed)
 PCP: Oley Bascom RAMAN, NP Cardiology: Dr. Rolan  Chief complaint: CHF  33 y.o. with history of asthma, OSA, HIV+, and nonischemic cardiomyopathy returns for followup of CHF.  He was admitted in 6/20 with dyspnea and found to have CHF.  Echo showed EF 15-20% with diffuse hypokinesis, moderate-severe MR. LHC/RHC showed mild coronary disease, preserved cardiac output.  He was also diagnosed with HIV and started on Biktarvy .   Echo in 10/20 showed EF up to 35-40%, MR is only trivial.  Echo in 2/21 showed EF 35-40% with normal RV.  Echo 7/23 showed EF up to 45% with normal RV.   Echo 4/24 showed EF 40-45%, mild LV dilation, global hypokinesis, normal RV.   Echo 8/25 EF 45-50%, (difficult study), normal RV  Today he returns for HF follow up. Overall feeling fine. No SOB walking, grocery shopping or walking up steps. Feels he has to take a deep breath to get a good breath in. Denies palpitations, abnormal bleeding, CP, dizziness, edema, or PND/Orthopnea. Chronically sleeps reclined. Appetite ok. Weight at home 437 pounds. Taking all medications. Wears CPAP 3/7 nights a week.  He is being evaluated for gastric sleeve at [facility] Has appt with PharmD for GLP consideration soon. Drinks ETOH on weekends, no drugs, or tobacco. Has 2 daughters (Kindergarten and 4th grade). Works for [employer].   ECG (personally reviewed): NSR 84 bpm  Labs (4/24): K 4.1, creatinine 1.22, LDL 38 Labs (1/25): K 3.7, creatinine 1.11 Labs (5/25): K 4.3, creatinine 1.15, LDL 47, TGs 205  PMH: 1. Chronic systolic CHF: Nonischemic cardiomyopathy.   - LHC/RHC (6/20): mean RA 4, mean PCWP 21, CI 3; 70% D1 stenosis.  - Echo (6/20): EF 15-20%, normal RV, moderate-severe MR.  - Echo (10/20): EF 35-40% with diffuse hypokinesis, normal RV, trivial MR.   - Echo (2/21): EF 35-40%, diffuse hypokinesis, normal RV, normal IVC - Echo (7/22): EF 45%, normal RV.  - Echo (4/24): EF 40-45%, mild LV dilation, global hypokinesis, normal  RV. - Echo (8/25): EF 45-50%, (difficult study), normal RV 2. HIV+: Followed by ID.  3. H/o ETOH abuse.  4. Asthma: Since childhood.  5. HTN 6. Morbid obesity.  7. OSA: Uses CPAP.  8. CAD: LHC in 6/20 with 70% D1 stenosis.   FH: Father, grandf ather with CAD, no cardiomyopathy.   SH: Lives in [place] with girlfriend and child, works for [employer] [place].  Nonsmoker.  Prior heavy ETOH, now drinks some on weekend but much less. Nonsmoker, no drugs.   ROS: All systems reviewed and negative except as per HPI.   Current Outpatient Medications  Medication Sig Dispense Refill   albuterol  (VENTOLIN  HFA) 108 (90 Base) MCG/ACT inhaler Inhale 1-2 puffs into the lungs every 6 (six) hours as needed for wheezing or shortness of breath. 6.7 g 2   atorvastatin  (LIPITOR) 20 MG tablet Take 1 tablet (20 mg total) by mouth daily. 90 tablet 3   bictegravir-emtricitabine -tenofovir  AF (BIKTARVY ) 50-200-25 MG TABS tablet Take 1 tablet by mouth daily. 30 tablet 6   bisoprolol  (ZEBETA ) 10 MG tablet Take 1 tablet (10 mg total) by mouth daily. 90 tablet 3   dapagliflozin  propanediol (FARXIGA ) 10 MG TABS tablet Take 1 tablet (10 mg total) by mouth daily before breakfast. 90 tablet 3   fluticasone  (FLONASE ) 50 MCG/ACT nasal spray Place 2 sprays into both nostrils daily. 16 g 0   furosemide  (LASIX ) 80 MG tablet Take 1 tablet (80 mg total) by mouth daily. 90 tablet 2  isosorbide-hydrALAZINE  (BIDIL ) 20-37.5 MG tablet Take 1 tablet by mouth 3 (three) times daily. 270 tablet 1   sacubitril -valsartan  (ENTRESTO ) 97-103 MG Take 1 tablet by mouth 2 (two) times daily. 180 tablet 3   spironolactone  (ALDACTONE ) 25 MG tablet Take 1 tablet (25 mg total) by mouth daily. 90 tablet 3   No current facility-administered medications for this encounter.   Wt Readings from Last 3 Encounters:  11/11/23 (!) 202.8 kg (447 lb 3.2 oz)  09/24/23 (!) 195.9 kg (431 lb 12.8 oz)  08/28/23 (!) 198.7 kg (438 lb)   BP 116/66   Pulse 84    Ht 5' 10 (1.778 m)   Wt (!) 202.8 kg (447 lb 3.2 oz)   SpO2 96%   BMI 64.17 kg/m  Physical Exam General:  NAD. No resp difficulty, walked into clinic HEENT: Normal Neck: Supple. Thick neck Cor: Regular rate & rhythm. No rubs, gallops or murmurs. Lungs: Clear, diminished in bases Abdomen: Soft, obese, nontender, nondistended.  Extremities: No cyanosis, clubbing, rash, edema Neuro: Alert & oriented x 3, moves all 4 extremities w/o difficulty. Affect pleasant.  Assessment/Plan: 1. Chronic systolic CHF: Nonischemic cardiomyopathy.  Echo in 6/20 showed EF 15-20% with moderate-severe MR.  RHC/LHC indicated nonischemic cardiomyopathy, cardiac output preserved. Cause of cardiomyopathy is uncertain, he was a heavy drinker in the past.  He has also been found to have HIV, which can cause a cardiomyopathy, but he has been on meds for this and it is controlled.  Echo in 10/20 showed EF improved up to 35-40%.  Echo in 2/21 showed EF 35-40% still.  Echo 7/22 showed EF up to 45%.  Echo (4/24) showed EF 40-45%. Echo 8/25 EF 45-50%, (difficult study), normal RV.  Exam is difficult for volume but he does not appear to have significant volume overload.  NYHA class II symptoms.  - Continue bisoprolol  10 mg daily.  - Continue Entresto  97/103 mg bid.  - Continue spironolactone  25 mg daily. BMET/BNP today. - Continue Bidil  1 tab tid.  - Continue dapagliflozin  10 mg daily.  - Continue Lasix  80 mg daily.     - EF has been outside of ICD range.      - I do not think that he would fit in scanner for cardiac MRI.  2. CAD: LHC (6/20) showed 70% D1 stenosis.  No chest pain. - Continue ASA 81 daily.  - Continue atorvastatin  20 mg daily. TGs recently elevated, will bring back for fasting lipid panel. 3. Mitral regurgitation: Much improved on most recent echoes. Trivial on echo 8/25. 4. ETOH abuse: Prior ETOH abuse may have contributed to his cardiomyopathy.  - He has cut back considerably.  5. OSA: Encouraged better  compliance CPAP.  6. Morbid Obesity: Body mass index is 64.17 kg/m.  - He has follow up with PharmD soon to discuss GLP - Being evaluated at [facility] for gastric sleeve.  7. HTN: BP controlled. Encouraged CPAP use. - Continue meds as above  Doing well! Follow up in 6 months with Dr. Rolan Raisin [facility] FNP-BC 11/11/2023

## 2023-11-10 ENCOUNTER — Telehealth (HOSPITAL_COMMUNITY): Payer: Self-pay | Admitting: *Deleted

## 2023-11-10 NOTE — Telephone Encounter (Signed)
 Called to confirm/remind patient of their appointment at the [facility] on 11/11/23.        Appointment:              [x] Confirmed             [] Left mess              [] No answer/No voice mail             [] Phone not in service   Patient reminded to bring all medications and/or complete list.   Confirmed patient has transportation. Gave directions, instructed to utilize valet parking.

## 2023-11-11 ENCOUNTER — Encounter (HOSPITAL_COMMUNITY): Payer: Self-pay

## 2023-11-11 ENCOUNTER — Ambulatory Visit (HOSPITAL_COMMUNITY)
Admission: RE | Admit: 2023-11-11 | Discharge: 2023-11-11 | Disposition: A | Discharge status: Home / Self Care | Source: Ambulatory Visit | Attending: Family Medicine | Admitting: Family Medicine

## 2023-11-11 VITALS — BP 116/66 | HR 84 | Ht 70.0 in | Wt >= 6400 oz

## 2023-11-11 DIAGNOSIS — Z21 Asymptomatic human immunodeficiency virus [HIV] infection status: Secondary | ICD-10-CM | POA: Diagnosis not present

## 2023-11-11 DIAGNOSIS — G4733 Obstructive sleep apnea (adult) (pediatric): Secondary | ICD-10-CM | POA: Diagnosis not present

## 2023-11-11 DIAGNOSIS — Z6841 Body Mass Index (BMI) 40.0 and over, adult: Secondary | ICD-10-CM | POA: Insufficient documentation

## 2023-11-11 DIAGNOSIS — F1010 Alcohol abuse, uncomplicated: Secondary | ICD-10-CM

## 2023-11-11 DIAGNOSIS — I428 Other cardiomyopathies: Secondary | ICD-10-CM | POA: Insufficient documentation

## 2023-11-11 DIAGNOSIS — I10 Essential (primary) hypertension: Secondary | ICD-10-CM

## 2023-11-11 DIAGNOSIS — I5022 Chronic systolic (congestive) heart failure: Secondary | ICD-10-CM | POA: Insufficient documentation

## 2023-11-11 DIAGNOSIS — I2510 Atherosclerotic heart disease of native coronary artery without angina pectoris: Secondary | ICD-10-CM | POA: Diagnosis not present

## 2023-11-11 DIAGNOSIS — Z7982 Long term (current) use of aspirin: Secondary | ICD-10-CM | POA: Diagnosis not present

## 2023-11-11 DIAGNOSIS — J45909 Unspecified asthma, uncomplicated: Secondary | ICD-10-CM | POA: Diagnosis not present

## 2023-11-11 DIAGNOSIS — I110 Hypertensive heart disease with heart failure: Secondary | ICD-10-CM | POA: Diagnosis not present

## 2023-11-11 DIAGNOSIS — Z79899 Other long term (current) drug therapy: Secondary | ICD-10-CM | POA: Insufficient documentation

## 2023-11-11 DIAGNOSIS — I340 Nonrheumatic mitral (valve) insufficiency: Secondary | ICD-10-CM | POA: Insufficient documentation

## 2023-11-11 LAB — BASIC METABOLIC PANEL WITH GFR
Anion gap: 10 (ref 5–15)
BUN: 16 mg/dL (ref 6–20)
CO2: 27 mmol/L (ref 22–32)
Calcium: 8.7 mg/dL — ABNORMAL LOW (ref 8.9–10.3)
Chloride: 99 mmol/L (ref 98–111)
Creatinine, Ser: 1.29 mg/dL — ABNORMAL HIGH (ref 0.61–1.24)
GFR, Estimated: >60 mL/min (ref >60)
Glucose, Bld: 95 mg/dL (ref 70–99)
Potassium: 3.6 mmol/L (ref 3.5–5.1)
Sodium: 136 mmol/L (ref 135–145)

## 2023-11-11 NOTE — Patient Instructions (Addendum)
 Thank you for coming in today  If you had labs drawn today, any labs that are abnormal the clinic will call you No news is good news  Medications: No changes  Follow up appointments: Your physician recommends that you return for lab work in: fasting Lipid labs 11/26/2023 at 8:30am Nothing to eat or drink after midnight until labs are drawn.  Your physician recommends that you schedule a follow-up appointment in:  6 months With Dr. Rolan Please call our office to schedule the follow-up appointment in December 2025 for February 2026.   Do the following things EVERYDAY: Weigh yourself in the morning before breakfast. Write it down and keep it in a log. Take your medicines as prescribed Eat low salt foods--Limit salt (sodium) to 2000 mg per day.  Stay as active as you can everyday Limit all fluids for the day to less than 2 liters   At the [facility], you and your health needs are our priority. As part of our continuing mission to provide you with exceptional heart care, we have created designated Provider Care Teams. These Care Teams include your primary Cardiologist (physician) and Advanced Practice Providers (APPs- Physician Assistants and Nurse Practitioners) who all work together to provide you with the care you need, when you need it.   You may see any of the following providers on your designated Care Team at your next follow up: Dr Toribio Fuel Dr Ezra Rolan Dr. Ria Gardenia Greig Lenetta, NP Caffie Shed, [place] [facility] [place], [place] Beckey Coe, NP Tinnie Redman, PharmD   Please be sure to bring in all your medications bottles to every appointment.    Thank you for choosing [facility]  If you have any questions or concerns before your next appointment please send us  a message through [place] or call our office at [phone].    TO LEAVE A MESSAGE FOR THE NURSE SELECT OPTION 2, PLEASE  LEAVE A MESSAGE INCLUDING: YOUR NAME DATE OF BIRTH CALL BACK NUMBER REASON FOR CALL**this is important as we prioritize the call backs  YOU WILL RECEIVE A CALL BACK THE SAME DAY AS LONG AS YOU CALL BEFORE 4:00 PM

## 2023-11-12 ENCOUNTER — Ambulatory Visit (HOSPITAL_COMMUNITY): Payer: Self-pay | Admitting: Family Medicine

## 2023-11-14 ENCOUNTER — Other Ambulatory Visit (HOSPITAL_COMMUNITY): Payer: Self-pay

## 2023-11-19 ENCOUNTER — Ambulatory Visit (INDEPENDENT_AMBULATORY_CARE_PROVIDER_SITE_OTHER): Admitting: Internal Medicine

## 2023-11-19 ENCOUNTER — Other Ambulatory Visit: Payer: Self-pay

## 2023-11-19 ENCOUNTER — Ambulatory Visit

## 2023-11-19 ENCOUNTER — Telehealth: Payer: Self-pay

## 2023-11-19 ENCOUNTER — Other Ambulatory Visit (HOSPITAL_COMMUNITY): Payer: Self-pay

## 2023-11-19 VITALS — BP 150/82 | HR 80 | Temp 97.8°F | Wt >= 6400 oz

## 2023-11-19 DIAGNOSIS — Z23 Encounter for immunization: Secondary | ICD-10-CM | POA: Diagnosis not present

## 2023-11-19 DIAGNOSIS — B20 Human immunodeficiency virus [HIV] disease: Secondary | ICD-10-CM

## 2023-11-19 MED ORDER — BIKTARVY 50-200-25 MG PO TABS
1.0000 | ORAL_TABLET | Freq: Every day | ORAL | 6 refills | Status: AC
  Filled 2023-11-19 – 2023-12-02 (×3): qty 30, 30d supply, fill #0
  Filled 2024-01-02: qty 30, 30d supply, fill #1
  Filled 2024-02-02: qty 30, 30d supply, fill #2
  Filled 2024-03-04 – 2024-03-18 (×2): qty 30, 30d supply, fill #3
  Filled 2024-04-15: qty 30, 30d supply, fill #4

## 2023-11-19 NOTE — Telephone Encounter (Signed)
 [facility] Patient Advocate Encounter   Was successful in obtaining a [employer] copay card for BIKTARVY .  This copay card will make the patients copay $0.  I have spoken with the patient.    The billing information is as follows and has been shared with [facility].  RxBin: [id number] PCN: [id number] Member ID: [id number] Group ID: [id number]    Charmaine Sharps, CPhT Specialty Pharmacy Patient [facility] Phone: [phone] Fax:  [phone]

## 2023-11-19 NOTE — Progress Notes (Signed)
 [facility]     HPI: Todd Griffith is a 33 y.o. male presents for HIV management. ON biktarvy , notes one or two missed doses since last visit.  Same partner since LV.   Past Medical History:  Diagnosis Date   Anxiety    Asthma    Bronchitis    CHF (congestive heart failure) (HCC)    HIV (human immunodeficiency virus infection) (HCC)    Obstructive sleep apnea     Past Surgical History:  Procedure Laterality Date   RIGHT/LEFT HEART CATH AND CORONARY ANGIOGRAPHY N/A 09/21/2018   Procedure: RIGHT/LEFT HEART CATH AND CORONARY ANGIOGRAPHY;  Surgeon: Anner Alm ORN, MD;  [facility] INVASIVE CV LAB;  Service: Cardiovascular;  Laterality: N/A;    Family History  Problem Relation Age of Onset   Hypertension Mother    Diabetes Mellitus II Father    CAD Father        Died at 28 of 'CAD' while sleeping    Obesity Sister    Colon cancer Neg Hx    Esophageal cancer Neg Hx    Current Outpatient Medications on File Prior to Visit  Medication Sig Dispense Refill   albuterol  (VENTOLIN  HFA) 108 (90 Base) MCG/ACT inhaler Inhale 1-2 puffs into the lungs every 6 (six) hours as needed for wheezing or shortness of breath. 6.7 g 2   atorvastatin  (LIPITOR) 20 MG tablet Take 1 tablet (20 mg total) by mouth daily. 90 tablet 3   bictegravir-emtricitabine -tenofovir  AF (BIKTARVY ) 50-200-25 MG TABS tablet Take 1 tablet by mouth daily. 30 tablet 6   bisoprolol  (ZEBETA ) 10 MG tablet Take 1 tablet (10 mg total) by mouth daily. 90 tablet 3   dapagliflozin  propanediol (FARXIGA ) 10 MG TABS tablet Take 1 tablet (10 mg total) by mouth daily before breakfast. 90 tablet 3   fluticasone  (FLONASE ) 50 MCG/ACT nasal spray Place 2 sprays into both nostrils daily. 16 g 0   furosemide  (LASIX ) 80 MG tablet Take 1 tablet (80 mg total) by mouth daily. 90 tablet 2   isosorbide-hydrALAZINE  (BIDIL ) 20-37.5 MG tablet Take 1 tablet by mouth 3 (three) times daily. 270 tablet 1    sacubitril -valsartan  (ENTRESTO ) 97-103 MG Take 1 tablet by mouth 2 (two) times daily. 180 tablet 3   spironolactone  (ALDACTONE ) 25 MG tablet Take 1 tablet (25 mg total) by mouth daily. 90 tablet 3   No current facility-administered medications on file prior to visit.    No Known Allergies    Lab Results HIV 1 RNA Quant (Copies/mL)  Date Value  04/22/2023 Not Detected  02/11/2023 Not Detected  05/22/2022 Not Detected   CD4 T Cell Abs (/uL)  Date Value  04/22/2023 1,100  02/11/2023 1,164  05/22/2022 1,306   No results found for: HIV1GENOSEQ Lab Results  Component Value Date   WBC 7.5 08/28/2023   HGB 13.0 08/28/2023   HCT 38.6 08/28/2023   MCV 100 (H) 08/28/2023   PLT 262 08/28/2023    Lab Results  Component Value Date   CREATININE 1.29 (H) 11/11/2023   BUN 16 11/11/2023   NA 136 11/11/2023   K 3.6 11/11/2023   CL 99 11/11/2023   CO2 27 11/11/2023   Lab Results  Component Value Date   ALT 46 (H) 08/28/2023   AST 31 08/28/2023   ALKPHOS 107 08/28/2023   BILITOT 1.1 08/28/2023    Lab Results  Component Value Date   CHOL 106 08/28/2023   TRIG 205 (  H) 08/28/2023   HDL 26 (L) 08/28/2023   LDLCALC 47 08/28/2023   Lab Results  Component Value Date   HAV Negative 09/22/2018   Lab Results  Component Value Date   HEPBSAG Negative 09/18/2018   Lab Results  Component Value Date   HCVAB 0.1 09/18/2018   Lab Results  Component Value Date   CHLAMYDIAWP Negative 02/11/2023   N Negative 02/11/2023   No results found for: GCPROBEAPT No results found for: QUANTGOLD  Assessment/Plan #HIV -CD4 1k, VLnd, on 1/25 -continue biktarvy  -F/U in 6months    #Vaccination COVID Flu Monkeypox PCV 23->02/23/19 Meningitis HepA no nimmune, serology otoday HEpB immune Tdap 2020 HPV->today  Shingles  #Health maintenance -Quantiferon-today -RPR-nr 11/24 -HCV -2020 negative -GC uirne negative 08/28/23 -Lipid on statin -Dysplasia screen F/M -Mammogram   -Colonoscopy    Loney Stank, MD [facility] I have personally spent 42 minutes involved in face-to-face and non-face-to-face activities for this patient on the day of the visit. Professional time spent includes the following activities: Preparing to see the patient (review of tests), Obtaining and/or reviewing separately obtained history (admission/discharge record), Performing a medically appropriate examination and/or evaluation , Ordering medications/tests/procedures, referring and communicating with other health care professionals, Documenting clinical information in the EMR, Independently interpreting results (not separately reported), Communicating results to the patient/family/caregiver, Counseling and educating the patient/family/caregiver and Care coordination (not separately reported).

## 2023-11-20 LAB — T-HELPER CELLS (CD4) COUNT (NOT AT ARMC)
CD4 % Helper T Cell: 46 % (ref 33–65)
CD4 T Cell Abs: 1042 /uL (ref 400–1790)

## 2023-11-22 LAB — COMPREHENSIVE METABOLIC PANEL WITH GFR
AG Ratio: 1.4 (calc) (ref 1.0–2.5)
ALT: 34 U/L (ref 9–46)
AST: 24 U/L (ref 10–40)
Albumin: 4.1 g/dL (ref 3.6–5.1)
Alkaline phosphatase (APISO): 94 U/L (ref 36–130)
BUN: 14 mg/dL (ref 7–25)
CO2: 29 mmol/L (ref 20–32)
Calcium: 9.3 mg/dL (ref 8.6–10.3)
Chloride: 100 mmol/L (ref 98–110)
Creat: 1.14 mg/dL (ref 0.60–1.26)
Globulin: 3 g/dL (ref 1.9–3.7)
Glucose, Bld: 95 mg/dL (ref 65–99)
Potassium: 3.9 mmol/L (ref 3.5–5.3)
Sodium: 137 mmol/L (ref 135–146)
Total Bilirubin: 1 mg/dL (ref 0.2–1.2)
Total Protein: 7.1 g/dL (ref 6.1–8.1)
eGFR: 87 mL/min/1.73m2 (ref >=60)

## 2023-11-22 LAB — CBC WITH DIFFERENTIAL/PLATELET
Absolute Lymphocytes: 2357 {cells}/uL (ref 850–3900)
Absolute Monocytes: 341 {cells}/uL (ref 200–950)
Basophils Absolute: 28 {cells}/uL (ref 0–200)
Basophils Relative: 0.4 %
Eosinophils Absolute: 646 {cells}/uL — ABNORMAL HIGH (ref 15–500)
Eosinophils Relative: 9.1 %
HCT: 40.9 % (ref 38.5–50.0)
Hemoglobin: 13.4 g/dL (ref 13.2–17.1)
MCH: 32.1 pg (ref 27.0–33.0)
MCHC: 32.8 g/dL (ref 32.0–36.0)
MCV: 97.8 fL (ref 80.0–100.0)
MPV: 10.3 fL (ref 7.5–12.5)
Monocytes Relative: 4.8 %
Neutro Abs: 3728 {cells}/uL (ref 1500–7800)
Neutrophils Relative %: 52.5 %
Platelets: 263 Thousand/uL (ref 140–400)
RBC: 4.18 Million/uL — ABNORMAL LOW (ref 4.20–5.80)
RDW: 13.6 % (ref 11.0–15.0)
Total Lymphocyte: 33.2 %
WBC: 7.1 Thousand/uL (ref 3.8–10.8)

## 2023-11-22 LAB — QUANTIFERON-TB GOLD PLUS
Mitogen-NIL: 6.4 [IU]/mL
NIL: 0.13 [IU]/mL
QuantiFERON-TB Gold Plus: NEGATIVE
TB1-NIL: 0.02 [IU]/mL
TB2-NIL: 0.03 [IU]/mL

## 2023-11-22 LAB — HEPATITIS A ANTIBODY, TOTAL: Hepatitis A AB,Total: REACTIVE — AB

## 2023-11-22 LAB — HIV-1 RNA QUANT-NO REFLEX-BLD
HIV 1 RNA Quant: NOT DETECTED {copies}/mL
HIV-1 RNA Quant, Log: NOT DETECTED {Log_copies}/mL

## 2023-11-26 ENCOUNTER — Other Ambulatory Visit: Payer: Self-pay

## 2023-11-26 ENCOUNTER — Other Ambulatory Visit (HOSPITAL_COMMUNITY)

## 2023-11-28 ENCOUNTER — Other Ambulatory Visit: Payer: Self-pay

## 2023-12-02 ENCOUNTER — Other Ambulatory Visit: Payer: Self-pay

## 2023-12-02 ENCOUNTER — Other Ambulatory Visit (HOSPITAL_COMMUNITY): Payer: Self-pay

## 2023-12-02 NOTE — Progress Notes (Signed)
 Specialty Pharmacy Refill Coordination Note  Spoke with Marylee Cora Ksiazek  Sammuel Marquel Kray is a 33 y.o. male contacted today regarding refills of specialty medication(s) Bictegravir-Emtricitab-Tenofov (Biktarvy )  Doses on hand: 10  Patient requested: Delivery   Delivery date: 12/08/23   Verified address: [address] [place]  [place] [address]  Medication will be filled on 12/05/23.

## 2023-12-04 ENCOUNTER — Other Ambulatory Visit: Payer: Self-pay

## 2023-12-10 ENCOUNTER — Other Ambulatory Visit: Payer: Self-pay

## 2023-12-10 ENCOUNTER — Other Ambulatory Visit (HOSPITAL_COMMUNITY): Payer: Self-pay

## 2023-12-11 ENCOUNTER — Other Ambulatory Visit: Payer: Self-pay

## 2023-12-12 ENCOUNTER — Ambulatory Visit: Admitting: Pharmacist

## 2023-12-12 NOTE — Progress Notes (Deleted)
 Patient ID: Todd Griffith                 DOB: 1990/06/19                    MRN: [id number]     HPI: Todd Griffith is a 33 y.o. male patient referred to pharmacy clinic by Ezra Shuck, MD to initiate GLP1-RA therapy. PMH is significant for CHF, HIV, OSA (AHI 32.9), and obesity. Most recent BMI 63.42 kg/m .  Baseline weight and BMI: *** kg/m  Current weight and BMI: *** kg/m  Current meds that affect weight: ****    *** Follow-up visit  Assess % weight loss Assess adverse effects Missed doses  Diet:   Exercise:   Family History:  Family History  Problem Relation Age of Onset   Hypertension Mother    Diabetes Mellitus II Father    CAD Father        Died at 80 of 'CAD' while sleeping    Obesity Sister    Colon cancer Neg Hx    Esophageal cancer Neg Hx     Social History:  Social History   Socioeconomic History   Marital status: Significant Other    Spouse name: Not on file   Number of children: 2   Years of education: 16   Highest education level: Bachelor's degree (e.g., BA, AB, BS)  Occupational History   Occupation: Sr Orthoptist: [employer]  Tobacco Use   Smoking status: Former    Current packs/day: 0.00    Types: Cigarettes    Quit date: 05/31/2018    Years since quitting: 5.5   Smokeless tobacco: Never  Vaping Use   Vaping status: Former  Substance and Sexual Activity   Alcohol use: Yes    Alcohol/week: 3.0 standard drinks of alcohol    Types: 3 Cans of beer per week    Comment: socially    Drug use: Not Currently   Sexual activity: Yes    Partners: Female    Comment: declined condoms  Other Topics Concern   Not on file  Social History Narrative   Not on file   Social Drivers of Health   Financial Resource Strain: Low Risk  (08/27/2023)   Overall Financial Resource Strain (CARDIA)    Difficulty of Paying Living Expenses: Not very hard  Food Insecurity: No Food Insecurity (08/27/2023)    Hunger Vital Sign    Worried About Running Out of Food in the Last Year: Never true    Ran Out of Food in the Last Year: Never true  Transportation Needs: No Transportation Needs (08/27/2023)   PRAPARE - Administrator, Civil Service (Medical): No    Lack of Transportation (Non-Medical): No  Physical Activity: Insufficiently Active (08/27/2023)   Exercise Vital Sign    Days of Exercise per Week: 3 days    Minutes of Exercise per Session: 30 min  Stress: Stress Concern Present (08/27/2023)   [employer] of Occupational Health - Occupational Stress Questionnaire    Feeling of Stress : To some extent  Social Connections: Moderately Integrated (08/27/2023)   Social Connection and Isolation Panel    Frequency of Communication with Friends and Family: Once a week    Frequency of Social Gatherings with Friends and Family: Once a week    Attends Religious Services: 1 to 4 times per year    Active Member of [employer] or Organizations: Yes  Attends Banker Meetings: 1 to 4 times per year    Marital Status: Living with partner  Intimate Partner Violence: Unknown (09/18/2018)   Humiliation, Afraid, Rape, and Kick questionnaire    Fear of Current or Ex-Partner: No    Emotionally Abused: No    Physically Abused: Not on file    Sexually Abused: No    Labs: Lab Results  Component Value Date   HGBA1C 4.9 08/28/2023    Wt Readings from Last 1 Encounters:  11/19/23 (!) 442 lb (200.5 kg)    BP Readings from Last 1 Encounters:  11/19/23 (!) 150/82   Pulse Readings from Last 1 Encounters:  11/19/23 80       Component Value Date/Time   CHOL 106 08/28/2023 1040   TRIG 205 (H) 08/28/2023 1040   HDL 26 (L) 08/28/2023 1040   CHOLHDL 4.1 08/28/2023 1040   CHOLHDL 3.7 07/29/2022 1427   VLDL 36 07/29/2022 1427   LDLCALC 47 08/28/2023 1040   LDLDIRECT 62 11/10/2020 1200    Past Medical History:  Diagnosis Date   Anxiety    Asthma    Bronchitis    CHF  (congestive heart failure) (HCC)    HIV (human immunodeficiency virus infection) (HCC)    Obstructive sleep apnea     Current Outpatient Medications on File Prior to Visit  Medication Sig Dispense Refill   albuterol  (VENTOLIN  HFA) 108 (90 Base) MCG/ACT inhaler Inhale 1-2 puffs into the lungs every 6 (six) hours as needed for wheezing or shortness of breath. 6.7 g 2   atorvastatin  (LIPITOR) 20 MG tablet Take 1 tablet (20 mg total) by mouth daily. 90 tablet 3   bictegravir-emtricitabine -tenofovir  AF (BIKTARVY ) 50-200-25 MG TABS tablet Take 1 tablet by mouth daily. 30 tablet 6   bisoprolol  (ZEBETA ) 10 MG tablet Take 1 tablet (10 mg total) by mouth daily. 90 tablet 3   dapagliflozin  propanediol (FARXIGA ) 10 MG TABS tablet Take 1 tablet (10 mg total) by mouth daily before breakfast. 90 tablet 3   fluticasone  (FLONASE ) 50 MCG/ACT nasal spray Place 2 sprays into both nostrils daily. 16 g 0   furosemide  (LASIX ) 80 MG tablet Take 1 tablet (80 mg total) by mouth daily. 90 tablet 2   isosorbide-hydrALAZINE  (BIDIL ) 20-37.5 MG tablet Take 1 tablet by mouth 3 (three) times daily. 270 tablet 1   sacubitril -valsartan  (ENTRESTO ) 97-103 MG Take 1 tablet by mouth 2 (two) times daily. 180 tablet 3   spironolactone  (ALDACTONE ) 25 MG tablet Take 1 tablet (25 mg total) by mouth daily. 90 tablet 3   No current facility-administered medications on file prior to visit.    No Known Allergies   Assessment/Plan:  1. Weight loss - Patient has not met goal of at least 5% of body weight loss with comprehensive lifestyle modifications alone in the past 3-6 months. Pharmacotherapy is appropriate to pursue as augmentation. Will start*** . Confirmed patient not ***pregnant and no personal or family history of medullary thyroid carcinoma (MTC) or Multiple Endocrine Neoplasia syndrome type 2 (MEN 2). Injection technique reviewed at today's visit.  Advised patient on common side effects including nausea, diarrhea, dyspepsia,  decreased appetite, and fatigue. Counseled patient on reducing meal size and how to titrate medication to minimize side effects. Counseled patient to call if intolerable side effects or if experiencing dehydration, abdominal pain, or dizziness. Along with pharmacotherapy, the patient will follow dietary modifications and aim for at least 150 minutes of moderate-intensity exercise per week, plus resistance training twice  a week (as recommended by the American Heart Association). This resistance training--such as weightlifting, bodyweight exercises, or using resistance bands, adapted to the patient's ability--will help prevent muscle loss.  Follow up in 1-2 days regarding coverage of *** . If therapy is initiated, phone follow-ups will be conducted every 4 weeks for dose titration until the patient reaches the effective therapeutic dose and target weight.  Gunnard Dorrance, PharmD PGY1 Pharmacy Resident [phone]  Eleanor JONETTA Crews, Pharm.[name], CPP [facility] A Division of [facility] [address] [place], [place] [address]  Phone: [phone]; Fax: [phone]

## 2024-01-02 ENCOUNTER — Other Ambulatory Visit: Payer: Self-pay

## 2024-01-02 ENCOUNTER — Other Ambulatory Visit (HOSPITAL_COMMUNITY): Payer: Self-pay

## 2024-01-06 ENCOUNTER — Other Ambulatory Visit: Payer: Self-pay

## 2024-01-06 NOTE — Progress Notes (Signed)
 Specialty Pharmacy Refill Coordination Note  Todd Griffith is a 33 y.o. male contacted today regarding refills of specialty medication(s) Bictegravir-Emtricitab-Tenofov (Biktarvy )   Patient requested Delivery   Delivery date: 01/08/24   Verified address: [address] [place]  [place] [address]   Medication will be filled on 01/07/24.

## 2024-01-14 ENCOUNTER — Ambulatory Visit: Attending: Cardiovascular Disease | Admitting: Pharmacist

## 2024-01-14 NOTE — Patient Instructions (Signed)

## 2024-01-14 NOTE — Progress Notes (Signed)
 Patient ID: Todd Griffith                 DOB: 1990/12/07                    MRN: [id number]     HPI: Todd Griffith is a 33 y.o. male patient referred to pharmacy clinic by Dr. Rolan to initiate GLP1-RA therapy. PMH is significant for asthma, OSA, HIV+, nonischemic cardiomyopathy, CAD obesity. Most recent BMI 65.29 kg/m .  I saw patient 3 years ago.  He was started on Ozempic  with plans to transition him to [facility].  However it looks like insurance had stopped covering or we were unable to connect with patient.  He presents today interested in getting back on GLP-1.  He has also been seen by [facility] for potential gastric sleeve.  Offered referral to nutritionist but he was not interested at this time.  He has been trying to make changes to his diet including eating more vegetables, meal prepping, portion control and eating less fried food.  He has been trying to exercise more but is not always consistent.  Diet:  Trying to implement more vegetables into diet Meal prep Chicken breast and broccoli alfredo Less fried foods Drinking: water, zero sugar power aid  Exercise: uses gym at his job 30 min workout every other day (weights) Occasionally walks around the neighborhood, sometimes walks on treadmill at [employer]  Family History:  Family History  Problem Relation Age of Onset   Hypertension Mother    Diabetes Mellitus II Father    CAD Father        Died at 43 of 'CAD' while sleeping    Obesity Sister    Colon cancer Neg Hx    Esophageal cancer Neg Hx      Social History: ETOH on weekends 3-5 cocktails, no tobacco  Labs: Lab Results  Component Value Date   HGBA1C 4.9 08/28/2023    Wt Readings from Last 1 Encounters:  11/19/23 (!) 442 lb (200.5 kg)    BP Readings from Last 1 Encounters:  11/19/23 (!) 150/82   Pulse Readings from Last 1 Encounters:  11/19/23 80       Component Value Date/Time   CHOL 106 08/28/2023 1040   TRIG 205 (H)  08/28/2023 1040   HDL 26 (L) 08/28/2023 1040   CHOLHDL 4.1 08/28/2023 1040   CHOLHDL 3.7 07/29/2022 1427   VLDL 36 07/29/2022 1427   LDLCALC 47 08/28/2023 1040   LDLDIRECT 62 11/10/2020 1200    Past Medical History:  Diagnosis Date   Anxiety    Asthma    Bronchitis    CHF (congestive heart failure) (HCC)    HIV (human immunodeficiency virus infection) (HCC)    Obstructive sleep apnea     Current Outpatient Medications on File Prior to Visit  Medication Sig Dispense Refill   albuterol  (VENTOLIN  HFA) 108 (90 Base) MCG/ACT inhaler Inhale 1-2 puffs into the lungs every 6 (six) hours as needed for wheezing or shortness of breath. 6.7 g 2   atorvastatin  (LIPITOR) 20 MG tablet Take 1 tablet (20 mg total) by mouth daily. 90 tablet 3   bictegravir-emtricitabine -tenofovir  AF (BIKTARVY ) 50-200-25 MG TABS tablet Take 1 tablet by mouth daily. 30 tablet 6   bisoprolol  (ZEBETA ) 10 MG tablet Take 1 tablet (10 mg total) by mouth daily. 90 tablet 3   dapagliflozin  propanediol (FARXIGA ) 10 MG TABS tablet Take 1 tablet (10 mg total) by mouth  daily before breakfast. 90 tablet 3   fluticasone  (FLONASE ) 50 MCG/ACT nasal spray Place 2 sprays into both nostrils daily. 16 g 0   furosemide  (LASIX ) 80 MG tablet Take 1 tablet (80 mg total) by mouth daily. 90 tablet 2   isosorbide-hydrALAZINE  (BIDIL ) 20-37.5 MG tablet Take 1 tablet by mouth 3 (three) times daily. 270 tablet 1   sacubitril -valsartan  (ENTRESTO ) 97-103 MG Take 1 tablet by mouth 2 (two) times daily. 180 tablet 3   spironolactone  (ALDACTONE ) 25 MG tablet Take 1 tablet (25 mg total) by mouth daily. 90 tablet 3   No current facility-administered medications on file prior to visit.    No Known Allergies   Assessment/Plan:  1. Weight loss - Patient has not met goal of at least 5% of body weight loss with comprehensive lifestyle modifications alone in the past 3-6 months. Pharmacotherapy is appropriate to pursue as augmentation. Will start Zepbound  2.5 mg. Confirmed patient has no personal or family history of medullary thyroid carcinoma (MTC) or Multiple Endocrine Neoplasia syndrome type 2 (MEN 2). Injection technique reviewed at today's visit.  No history of pancreatitis or gallstones.  Advised patient on common side effects including nausea, diarrhea, dyspepsia, decreased appetite, and fatigue. Counseled patient on reducing meal size and how to titrate medication to minimize side effects. Counseled patient to call if intolerable side effects or if experiencing dehydration, abdominal pain, or dizziness. Along with pharmacotherapy, the patient will follow dietary modifications and aim for at least 150 minutes of moderate-intensity exercise per week, plus resistance training twice a week (as recommended by the American Heart Association). This resistance training--such as weightlifting, bodyweight exercises, or using resistance bands, adapted to the patient's ability--will help prevent muscle loss.  I encouraged him to cut back on alcohol intake and high fatty foods.  Provided him with information and link to the NIH website where he could calculate his calorie needs.  Follow up in 1-2 days regarding coverage of Zepbound. If therapy is initiated, phone follow-ups will be conducted every 4 weeks for dose titration until the patient reaches the effective therapeutic dose and target weight.  [name], Pharm.[name], [name] [address], [place], [place] [address]  Phone: [phone]; Fax: [phone]

## 2024-01-15 ENCOUNTER — Other Ambulatory Visit (HOSPITAL_COMMUNITY): Payer: Self-pay

## 2024-01-15 ENCOUNTER — Telehealth: Payer: Self-pay | Admitting: Pharmacy Technician

## 2024-01-15 NOTE — Telephone Encounter (Signed)
  AHI 32.9 nocturna hypoxia.    Pharmacy Patient Advocate Encounter   Received notification from Pt Calls Messages that prior authorization for zepbound is required/requested.   Insurance verification completed.   The patient is insured through [facility].   Per test claim: PA required; PA submitted to above mentioned insurance via Latent Key/confirmation #/EOC [facility] Status is pending     Pharmacy Patient Advocate Encounter   Received notification from Pt Calls Messages that prior authorization for zepbound is required/requested.   Insurance verification completed.   The patient is insured through [employer].   Per test claim: PA required; PA submitted to above mentioned insurance via Latent Key/confirmation #/EOC [id number] Status is pending    Medicaid said it is primary but also bcbs said its primary. Medicaid won't run as secondary

## 2024-01-15 NOTE — Telephone Encounter (Signed)
 [employer] denied due to not seeing the bmi/weight but it was in the attachments :     Sent again

## 2024-01-16 ENCOUNTER — Other Ambulatory Visit (HOSPITAL_COMMUNITY): Payer: Self-pay

## 2024-01-16 MED ORDER — ZEPBOUND 2.5 MG/0.5ML ~~LOC~~ SOAJ: 2.5000 mg | SUBCUTANEOUS | 0 refills | Status: DC

## 2024-01-16 NOTE — Addendum Note (Signed)
 Addended by: Chrishaun Sasso K on: 01/16/2024 04:00 PM   Modules accepted: Orders

## 2024-01-16 NOTE — Telephone Encounter (Signed)
 Pharmacy Patient Advocate Encounter  Received notification from [employer] that Prior Authorization for zepbound has been APPROVED from 01/15/24 to 07/13/24. Ran test claim, Copay is $4.00. This test claim was processed through [facility]- copay amounts may vary at other pharmacies due to pharmacy/plan contracts, or as the patient moves through the different stages of their insurance plan.

## 2024-01-16 NOTE — Telephone Encounter (Signed)
 Pt made aware of PA approval. Pt to call us  in 3-4 weeks for dose titration.

## 2024-01-16 NOTE — Telephone Encounter (Signed)
 [employer] also approved zepbound  Pharmacy Patient Advocate Encounter  Received notification from [facility] that Prior Authorization for zepbound has been APPROVED from 01/15/24 to 07/13/24   PA #/Case ID/Reference #: [id number]

## 2024-01-21 ENCOUNTER — Ambulatory Visit

## 2024-01-29 ENCOUNTER — Other Ambulatory Visit (HOSPITAL_COMMUNITY): Payer: Self-pay

## 2024-02-02 ENCOUNTER — Other Ambulatory Visit (HOSPITAL_COMMUNITY): Payer: Self-pay

## 2024-02-04 ENCOUNTER — Ambulatory Visit (INDEPENDENT_AMBULATORY_CARE_PROVIDER_SITE_OTHER)

## 2024-02-04 ENCOUNTER — Other Ambulatory Visit: Payer: Self-pay

## 2024-02-04 DIAGNOSIS — Z23 Encounter for immunization: Secondary | ICD-10-CM

## 2024-02-04 NOTE — Progress Notes (Signed)
 Specialty Pharmacy Refill Coordination Note  [name] is a 33 y.o. male contacted today regarding refills of specialty medication(s) Bictegravir-Emtricitab-Tenofov (Biktarvy )   Patient requested Delivery   Delivery date: 02/11/24   Verified address: [address] [place]  [place] [address]   Medication will be filled on: 02/10/24

## 2024-02-10 ENCOUNTER — Other Ambulatory Visit: Payer: Self-pay

## 2024-02-11 ENCOUNTER — Encounter: Payer: Self-pay | Admitting: Pharmacist

## 2024-03-01 ENCOUNTER — Ambulatory Visit: Payer: Self-pay | Admitting: Nurse Practitioner

## 2024-03-01 MED ORDER — ZEPBOUND 5 MG/0.5ML ~~LOC~~ SOAJ: 5.0000 mg | SUBCUTANEOUS | 0 refills | Status: DC

## 2024-03-04 ENCOUNTER — Other Ambulatory Visit: Payer: Self-pay

## 2024-03-08 ENCOUNTER — Other Ambulatory Visit: Payer: Self-pay

## 2024-03-10 ENCOUNTER — Other Ambulatory Visit: Payer: Self-pay

## 2024-03-18 ENCOUNTER — Other Ambulatory Visit (HOSPITAL_COMMUNITY): Payer: Self-pay

## 2024-03-18 ENCOUNTER — Other Ambulatory Visit: Payer: Self-pay

## 2024-03-18 NOTE — Progress Notes (Signed)
 Specialty Pharmacy Refill Coordination Note  Todd Griffith is a 33 y.o. male contacted today regarding refills of specialty medication(s) Bictegravir-Emtricitab-Tenofov (Biktarvy )   Patient requested Delivery   Delivery date: 03/24/24   Verified address: [address] [place]  [place] [address]   Medication will be filled on: 03/23/24

## 2024-03-18 NOTE — Progress Notes (Signed)
 Specialty Pharmacy Ongoing Clinical Assessment Note  Todd Griffith is a 33 y.o. male who is being followed by the specialty pharmacy service for RxSp HIV   Patient's specialty medication(s) reviewed today: Bictegravir-Emtricitab-Tenofov (Biktarvy )   Missed doses in the last 4 weeks: 0   Patient/Caregiver did not have any additional questions or concerns.   Therapeutic benefit summary: Patient is achieving benefit   Adverse events/side effects summary: No adverse events/side effects   Patient's therapy is appropriate to: Continue    Goals Addressed             This Visit's Progress    Achieve Undetectable HIV Viral Load < 20   On track    Patient is on track. Patient will maintain adherence.  Patient's viral load has remained undetectable long-term, most recent lab from 11/19/23.         Follow up: 12 months  Silvano [name] Specialty Pharmacist

## 2024-04-02 ENCOUNTER — Other Ambulatory Visit: Payer: Self-pay | Admitting: Cardiology

## 2024-04-08 NOTE — Telephone Encounter (Signed)
 Patient did well on 5mg . Would like to increase. Has not done as much exercise with holidays but will work to increase this

## 2024-04-15 ENCOUNTER — Other Ambulatory Visit (HOSPITAL_COMMUNITY): Payer: Self-pay

## 2024-04-19 ENCOUNTER — Other Ambulatory Visit: Payer: Self-pay

## 2024-04-21 ENCOUNTER — Other Ambulatory Visit: Payer: Self-pay

## 2024-04-21 NOTE — Progress Notes (Signed)
 Specialty Pharmacy Refill Coordination Note  Todd Griffith is a 34 y.o. male contacted today regarding refills of specialty medication(s) Bictegravir-Emtricitab-Tenofov (Biktarvy )   Patient requested Delivery   Delivery date: 04/23/24   Verified address: [address] [place]  [place] [address]   Medication will be filled on: 04/22/24

## 2024-04-22 ENCOUNTER — Other Ambulatory Visit: Payer: Self-pay

## 2024-05-19 ENCOUNTER — Ambulatory Visit: Admitting: Family
